# Patient Record
Sex: Female | Born: 1945 | Race: White | Hispanic: No | Marital: Single | Smoking: Never smoker
Health system: Southern US, Community
[De-identification: ages and names within clinical notes are randomized; demographics above are authoritative.]

## PROBLEM LIST (undated history)

## (undated) DIAGNOSIS — IMO0002 Reserved for concepts with insufficient information to code with codable children: Secondary | ICD-10-CM

## (undated) DIAGNOSIS — F418 Other specified anxiety disorders: Secondary | ICD-10-CM

## (undated) DIAGNOSIS — I10 Essential (primary) hypertension: Secondary | ICD-10-CM

## (undated) DIAGNOSIS — R9431 Abnormal electrocardiogram [ECG] [EKG]: Secondary | ICD-10-CM

## (undated) DIAGNOSIS — F32A Depression, unspecified: Secondary | ICD-10-CM

## (undated) DIAGNOSIS — J189 Pneumonia, unspecified organism: Secondary | ICD-10-CM

## (undated) DIAGNOSIS — IMO0001 Reserved for inherently not codable concepts without codable children: Secondary | ICD-10-CM

## (undated) DIAGNOSIS — F329 Major depressive disorder, single episode, unspecified: Secondary | ICD-10-CM

## (undated) DIAGNOSIS — F419 Anxiety disorder, unspecified: Secondary | ICD-10-CM

## (undated) DIAGNOSIS — R748 Abnormal levels of other serum enzymes: Secondary | ICD-10-CM

## (undated) DIAGNOSIS — K635 Polyp of colon: Secondary | ICD-10-CM

## (undated) DIAGNOSIS — J45909 Unspecified asthma, uncomplicated: Secondary | ICD-10-CM

## (undated) DIAGNOSIS — C801 Malignant (primary) neoplasm, unspecified: Secondary | ICD-10-CM

## (undated) DIAGNOSIS — E1165 Type 2 diabetes mellitus with hyperglycemia: Secondary | ICD-10-CM

## (undated) DIAGNOSIS — C50919 Malignant neoplasm of unspecified site of unspecified female breast: Secondary | ICD-10-CM

## (undated) DIAGNOSIS — Z923 Personal history of irradiation: Secondary | ICD-10-CM

## (undated) HISTORY — DX: Abnormal electrocardiogram (ECG) (EKG): R94.31

## (undated) HISTORY — PX: CHOLECYSTECTOMY: SHX55

## (undated) HISTORY — PX: APPENDECTOMY: SHX54

## (undated) HISTORY — DX: Reserved for concepts with insufficient information to code with codable children: IMO0002

## (undated) HISTORY — DX: Abnormal levels of other serum enzymes: R74.8

## (undated) HISTORY — PX: CATARACT EXTRACTION: SUR2

## (undated) HISTORY — DX: Reserved for inherently not codable concepts without codable children: IMO0001

## (undated) HISTORY — PX: BILATERAL OOPHORECTOMY: SHX1221

## (undated) HISTORY — PX: ABDOMINAL HYSTERECTOMY: SHX81

## (undated) HISTORY — DX: Unspecified asthma, uncomplicated: J45.909

---
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
## 1998-08-06 ENCOUNTER — Other Ambulatory Visit: Admission: RE | Admit: 1998-08-06 | Discharge: 1998-08-06 | Payer: Self-pay | Admitting: Obstetrics and Gynecology

## 1999-10-21 ENCOUNTER — Other Ambulatory Visit: Admission: RE | Admit: 1999-10-21 | Discharge: 1999-10-21 | Payer: Self-pay | Admitting: Obstetrics and Gynecology

## 2001-02-14 ENCOUNTER — Other Ambulatory Visit: Admission: RE | Admit: 2001-02-14 | Discharge: 2001-02-14 | Payer: Self-pay | Admitting: Obstetrics and Gynecology

## 2009-01-20 ENCOUNTER — Encounter: Admission: RE | Admit: 2009-01-20 | Discharge: 2009-01-20 | Payer: Self-pay | Admitting: Orthopedic Surgery

## 2009-01-24 ENCOUNTER — Encounter: Admission: RE | Admit: 2009-01-24 | Discharge: 2009-01-24 | Payer: Self-pay | Admitting: Orthopedic Surgery

## 2010-04-12 ENCOUNTER — Encounter: Payer: Self-pay | Admitting: Orthopedic Surgery

## 2012-01-10 ENCOUNTER — Other Ambulatory Visit: Payer: Self-pay | Admitting: Family Medicine

## 2012-01-10 DIAGNOSIS — Z1231 Encounter for screening mammogram for malignant neoplasm of breast: Secondary | ICD-10-CM

## 2012-01-14 ENCOUNTER — Ambulatory Visit
Admission: RE | Admit: 2012-01-14 | Discharge: 2012-01-14 | Disposition: A | Discharge status: Home / Self Care | Payer: Medicare Other | Source: Ambulatory Visit | Attending: Family Medicine | Admitting: Family Medicine

## 2012-01-14 DIAGNOSIS — Z1231 Encounter for screening mammogram for malignant neoplasm of breast: Secondary | ICD-10-CM

## 2012-02-14 ENCOUNTER — Ambulatory Visit: Payer: Self-pay

## 2012-04-03 ENCOUNTER — Other Ambulatory Visit: Payer: Self-pay | Admitting: Family Medicine

## 2012-04-03 DIAGNOSIS — N644 Mastodynia: Secondary | ICD-10-CM

## 2014-04-11 DIAGNOSIS — L821 Other seborrheic keratosis: Secondary | ICD-10-CM | POA: Diagnosis not present

## 2014-04-11 DIAGNOSIS — Z411 Encounter for cosmetic surgery: Secondary | ICD-10-CM | POA: Diagnosis not present

## 2014-04-11 DIAGNOSIS — L280 Lichen simplex chronicus: Secondary | ICD-10-CM | POA: Diagnosis not present

## 2014-04-11 DIAGNOSIS — L820 Inflamed seborrheic keratosis: Secondary | ICD-10-CM | POA: Diagnosis not present

## 2014-05-06 DIAGNOSIS — I10 Essential (primary) hypertension: Secondary | ICD-10-CM | POA: Diagnosis not present

## 2014-05-06 DIAGNOSIS — M8580 Other specified disorders of bone density and structure, unspecified site: Secondary | ICD-10-CM | POA: Diagnosis not present

## 2014-05-06 DIAGNOSIS — Z23 Encounter for immunization: Secondary | ICD-10-CM | POA: Diagnosis not present

## 2014-05-06 DIAGNOSIS — R7309 Other abnormal glucose: Secondary | ICD-10-CM | POA: Diagnosis not present

## 2014-05-06 DIAGNOSIS — Z13820 Encounter for screening for osteoporosis: Secondary | ICD-10-CM | POA: Diagnosis not present

## 2014-05-06 DIAGNOSIS — J45909 Unspecified asthma, uncomplicated: Secondary | ICD-10-CM | POA: Diagnosis not present

## 2014-05-06 DIAGNOSIS — Z0000 Encounter for general adult medical examination without abnormal findings: Secondary | ICD-10-CM | POA: Diagnosis not present

## 2014-05-06 DIAGNOSIS — K219 Gastro-esophageal reflux disease without esophagitis: Secondary | ICD-10-CM | POA: Diagnosis not present

## 2014-05-06 DIAGNOSIS — F329 Major depressive disorder, single episode, unspecified: Secondary | ICD-10-CM | POA: Diagnosis not present

## 2014-05-06 DIAGNOSIS — Z13220 Encounter for screening for lipoid disorders: Secondary | ICD-10-CM | POA: Diagnosis not present

## 2014-06-28 DIAGNOSIS — E1165 Type 2 diabetes mellitus with hyperglycemia: Secondary | ICD-10-CM | POA: Diagnosis not present

## 2014-06-28 DIAGNOSIS — E8881 Metabolic syndrome: Secondary | ICD-10-CM | POA: Diagnosis not present

## 2014-06-28 DIAGNOSIS — I10 Essential (primary) hypertension: Secondary | ICD-10-CM | POA: Diagnosis not present

## 2014-06-28 DIAGNOSIS — Z9181 History of falling: Secondary | ICD-10-CM | POA: Diagnosis not present

## 2014-09-24 DIAGNOSIS — H26492 Other secondary cataract, left eye: Secondary | ICD-10-CM | POA: Diagnosis not present

## 2014-12-10 DIAGNOSIS — E1165 Type 2 diabetes mellitus with hyperglycemia: Secondary | ICD-10-CM | POA: Diagnosis not present

## 2014-12-10 DIAGNOSIS — K219 Gastro-esophageal reflux disease without esophagitis: Secondary | ICD-10-CM | POA: Diagnosis not present

## 2014-12-10 DIAGNOSIS — I10 Essential (primary) hypertension: Secondary | ICD-10-CM | POA: Diagnosis not present

## 2014-12-10 DIAGNOSIS — F419 Anxiety disorder, unspecified: Secondary | ICD-10-CM | POA: Diagnosis not present

## 2014-12-18 ENCOUNTER — Other Ambulatory Visit: Payer: Self-pay

## 2014-12-18 DIAGNOSIS — Z1231 Encounter for screening mammogram for malignant neoplasm of breast: Secondary | ICD-10-CM

## 2014-12-31 ENCOUNTER — Ambulatory Visit
Admission: RE | Admit: 2014-12-31 | Discharge: 2014-12-31 | Disposition: A | Discharge status: Home / Self Care | Payer: Medicare Other | Source: Ambulatory Visit

## 2014-12-31 DIAGNOSIS — Z1231 Encounter for screening mammogram for malignant neoplasm of breast: Secondary | ICD-10-CM

## 2015-01-02 ENCOUNTER — Other Ambulatory Visit: Payer: Self-pay | Admitting: Family Medicine

## 2015-01-02 DIAGNOSIS — R928 Other abnormal and inconclusive findings on diagnostic imaging of breast: Secondary | ICD-10-CM

## 2015-01-06 DIAGNOSIS — K219 Gastro-esophageal reflux disease without esophagitis: Secondary | ICD-10-CM | POA: Diagnosis not present

## 2015-01-09 ENCOUNTER — Ambulatory Visit
Admission: RE | Admit: 2015-01-09 | Discharge: 2015-01-09 | Disposition: A | Discharge status: Home / Self Care | Payer: Medicare Other | Source: Ambulatory Visit | Attending: Family Medicine | Admitting: Family Medicine

## 2015-01-09 ENCOUNTER — Other Ambulatory Visit: Payer: Self-pay | Admitting: Family Medicine

## 2015-01-09 DIAGNOSIS — N63 Unspecified lump in breast: Secondary | ICD-10-CM | POA: Diagnosis not present

## 2015-01-09 DIAGNOSIS — R928 Other abnormal and inconclusive findings on diagnostic imaging of breast: Secondary | ICD-10-CM

## 2015-01-15 ENCOUNTER — Other Ambulatory Visit: Payer: Self-pay | Admitting: Family Medicine

## 2015-01-15 ENCOUNTER — Ambulatory Visit
Admission: RE | Admit: 2015-01-15 | Discharge: 2015-01-15 | Disposition: A | Discharge status: Home / Self Care | Payer: Medicare Other | Source: Ambulatory Visit | Attending: Family Medicine | Admitting: Family Medicine

## 2015-01-15 DIAGNOSIS — R928 Other abnormal and inconclusive findings on diagnostic imaging of breast: Secondary | ICD-10-CM

## 2015-01-15 DIAGNOSIS — N63 Unspecified lump in breast: Secondary | ICD-10-CM | POA: Diagnosis not present

## 2015-01-15 DIAGNOSIS — C50412 Malignant neoplasm of upper-outer quadrant of left female breast: Secondary | ICD-10-CM | POA: Diagnosis not present

## 2015-01-16 ENCOUNTER — Other Ambulatory Visit: Payer: Self-pay

## 2015-01-16 DIAGNOSIS — K2970 Gastritis, unspecified, without bleeding: Secondary | ICD-10-CM | POA: Diagnosis not present

## 2015-01-16 DIAGNOSIS — K219 Gastro-esophageal reflux disease without esophagitis: Secondary | ICD-10-CM | POA: Diagnosis not present

## 2015-01-16 DIAGNOSIS — K449 Diaphragmatic hernia without obstruction or gangrene: Secondary | ICD-10-CM | POA: Diagnosis not present

## 2015-01-16 DIAGNOSIS — D122 Benign neoplasm of ascending colon: Secondary | ICD-10-CM | POA: Diagnosis not present

## 2015-01-16 DIAGNOSIS — K635 Polyp of colon: Secondary | ICD-10-CM | POA: Diagnosis not present

## 2015-01-16 DIAGNOSIS — I10 Essential (primary) hypertension: Secondary | ICD-10-CM | POA: Diagnosis not present

## 2015-01-16 DIAGNOSIS — K5730 Diverticulosis of large intestine without perforation or abscess without bleeding: Secondary | ICD-10-CM | POA: Diagnosis not present

## 2015-01-16 DIAGNOSIS — K621 Rectal polyp: Secondary | ICD-10-CM | POA: Diagnosis not present

## 2015-01-16 DIAGNOSIS — K209 Esophagitis, unspecified: Secondary | ICD-10-CM | POA: Diagnosis not present

## 2015-01-16 DIAGNOSIS — R1310 Dysphagia, unspecified: Secondary | ICD-10-CM | POA: Diagnosis not present

## 2015-01-16 DIAGNOSIS — Z86010 Personal history of colonic polyps: Secondary | ICD-10-CM | POA: Diagnosis not present

## 2015-01-16 DIAGNOSIS — Z1211 Encounter for screening for malignant neoplasm of colon: Secondary | ICD-10-CM | POA: Diagnosis not present

## 2015-01-16 DIAGNOSIS — Z9049 Acquired absence of other specified parts of digestive tract: Secondary | ICD-10-CM | POA: Diagnosis not present

## 2015-01-23 ENCOUNTER — Other Ambulatory Visit: Payer: Self-pay | Admitting: General Surgery

## 2015-01-23 DIAGNOSIS — C50412 Malignant neoplasm of upper-outer quadrant of left female breast: Secondary | ICD-10-CM | POA: Diagnosis not present

## 2015-02-06 ENCOUNTER — Other Ambulatory Visit: Payer: Self-pay | Admitting: General Surgery

## 2015-02-06 ENCOUNTER — Ambulatory Visit
Admission: RE | Admit: 2015-02-06 | Discharge: 2015-02-06 | Disposition: A | Discharge status: Home / Self Care | Payer: Medicare Other | Source: Ambulatory Visit | Attending: General Surgery | Admitting: General Surgery

## 2015-02-06 DIAGNOSIS — C50412 Malignant neoplasm of upper-outer quadrant of left female breast: Secondary | ICD-10-CM

## 2015-02-18 DIAGNOSIS — K589 Irritable bowel syndrome without diarrhea: Secondary | ICD-10-CM | POA: Diagnosis not present

## 2015-02-18 DIAGNOSIS — C50911 Malignant neoplasm of unspecified site of right female breast: Secondary | ICD-10-CM | POA: Diagnosis not present

## 2015-02-20 ENCOUNTER — Encounter (HOSPITAL_BASED_OUTPATIENT_CLINIC_OR_DEPARTMENT_OTHER): Payer: Self-pay | Admitting: *Deleted

## 2015-02-20 HISTORY — PX: BREAST LUMPECTOMY: SHX2

## 2015-02-21 ENCOUNTER — Other Ambulatory Visit: Payer: Self-pay

## 2015-02-21 ENCOUNTER — Encounter (HOSPITAL_BASED_OUTPATIENT_CLINIC_OR_DEPARTMENT_OTHER)
Admission: RE | Admit: 2015-02-21 | Discharge: 2015-02-21 | Disposition: A | Discharge status: Home / Self Care | Payer: Medicare Other | Source: Ambulatory Visit | Attending: General Surgery | Admitting: General Surgery

## 2015-02-21 DIAGNOSIS — Z9049 Acquired absence of other specified parts of digestive tract: Secondary | ICD-10-CM | POA: Diagnosis not present

## 2015-02-21 DIAGNOSIS — C50912 Malignant neoplasm of unspecified site of left female breast: Secondary | ICD-10-CM | POA: Insufficient documentation

## 2015-02-21 DIAGNOSIS — Z01818 Encounter for other preprocedural examination: Secondary | ICD-10-CM | POA: Insufficient documentation

## 2015-02-21 DIAGNOSIS — C50412 Malignant neoplasm of upper-outer quadrant of left female breast: Secondary | ICD-10-CM | POA: Diagnosis not present

## 2015-02-21 DIAGNOSIS — Z90710 Acquired absence of both cervix and uterus: Secondary | ICD-10-CM | POA: Diagnosis not present

## 2015-02-21 DIAGNOSIS — Z79899 Other long term (current) drug therapy: Secondary | ICD-10-CM | POA: Diagnosis not present

## 2015-02-21 DIAGNOSIS — Z170 Estrogen receptor positive status [ER+]: Secondary | ICD-10-CM | POA: Diagnosis not present

## 2015-02-21 DIAGNOSIS — I10 Essential (primary) hypertension: Secondary | ICD-10-CM | POA: Diagnosis not present

## 2015-02-21 DIAGNOSIS — F419 Anxiety disorder, unspecified: Secondary | ICD-10-CM | POA: Diagnosis not present

## 2015-02-21 DIAGNOSIS — Z6841 Body Mass Index (BMI) 40.0 and over, adult: Secondary | ICD-10-CM | POA: Diagnosis not present

## 2015-02-21 LAB — BASIC METABOLIC PANEL
Anion gap: 8 (ref 5–15)
BUN: 15 mg/dL (ref 6–20)
CO2: 32 mmol/L (ref 22–32)
Calcium: 9.4 mg/dL (ref 8.9–10.3)
Chloride: 100 mmol/L — ABNORMAL LOW (ref 101–111)
Creatinine, Ser: 0.92 mg/dL (ref 0.44–1.00)
GFR calc Af Amer: >60 mL/min (ref >60)
GFR calc non Af Amer: >60 mL/min (ref >60)
Glucose, Bld: 119 mg/dL — ABNORMAL HIGH (ref 65–99)
Potassium: 4.9 mmol/L (ref 3.5–5.1)
Sodium: 140 mmol/L (ref 135–145)

## 2015-02-21 NOTE — Progress Notes (Signed)
Anesthesia consult by Dr Lauretta Grill ok for surgery at [facility]

## 2015-02-24 ENCOUNTER — Ambulatory Visit
Admission: RE | Admit: 2015-02-24 | Discharge: 2015-02-24 | Disposition: A | Discharge status: Home / Self Care | Payer: Medicare Other | Source: Ambulatory Visit | Attending: General Surgery | Admitting: General Surgery

## 2015-02-24 DIAGNOSIS — Z01818 Encounter for other preprocedural examination: Secondary | ICD-10-CM | POA: Diagnosis not present

## 2015-02-24 DIAGNOSIS — C50412 Malignant neoplasm of upper-outer quadrant of left female breast: Secondary | ICD-10-CM

## 2015-02-24 DIAGNOSIS — R928 Other abnormal and inconclusive findings on diagnostic imaging of breast: Secondary | ICD-10-CM | POA: Diagnosis not present

## 2015-02-25 ENCOUNTER — Encounter (HOSPITAL_BASED_OUTPATIENT_CLINIC_OR_DEPARTMENT_OTHER)
Admission: RE | Admit: 2015-02-25 | Discharge: 2015-02-25 | Disposition: A | Discharge status: Home / Self Care | Payer: Medicare Other | Source: Ambulatory Visit | Attending: General Surgery | Admitting: General Surgery

## 2015-02-28 ENCOUNTER — Ambulatory Visit
Admission: RE | Admit: 2015-02-28 | Discharge: 2015-02-28 | Disposition: A | Discharge status: Home / Self Care | Payer: Medicare Other | Source: Ambulatory Visit | Attending: General Surgery | Admitting: General Surgery

## 2015-02-28 ENCOUNTER — Ambulatory Visit (HOSPITAL_BASED_OUTPATIENT_CLINIC_OR_DEPARTMENT_OTHER): Payer: Medicare Other | Admitting: Certified Registered"

## 2015-02-28 ENCOUNTER — Encounter (HOSPITAL_COMMUNITY)
Admission: RE | Admit: 2015-02-28 | Discharge: 2015-02-28 | Disposition: A | Discharge status: Home / Self Care | Payer: Medicare Other | Source: Ambulatory Visit | Attending: General Surgery | Admitting: General Surgery

## 2015-02-28 ENCOUNTER — Encounter (HOSPITAL_BASED_OUTPATIENT_CLINIC_OR_DEPARTMENT_OTHER): Payer: Self-pay | Admitting: *Deleted

## 2015-02-28 ENCOUNTER — Ambulatory Visit (HOSPITAL_BASED_OUTPATIENT_CLINIC_OR_DEPARTMENT_OTHER)
Admission: RE | Admit: 2015-02-28 | Discharge: 2015-02-28 | Disposition: A | Discharge status: Home / Self Care | Payer: Medicare Other | Source: Ambulatory Visit | Attending: General Surgery | Admitting: General Surgery

## 2015-02-28 ENCOUNTER — Encounter (HOSPITAL_BASED_OUTPATIENT_CLINIC_OR_DEPARTMENT_OTHER)
Admission: RE | Disposition: A | Discharge status: Home / Self Care | Payer: Self-pay | Source: Ambulatory Visit | Attending: General Surgery

## 2015-02-28 DIAGNOSIS — Z79899 Other long term (current) drug therapy: Secondary | ICD-10-CM | POA: Insufficient documentation

## 2015-02-28 DIAGNOSIS — Z90710 Acquired absence of both cervix and uterus: Secondary | ICD-10-CM | POA: Diagnosis not present

## 2015-02-28 DIAGNOSIS — R928 Other abnormal and inconclusive findings on diagnostic imaging of breast: Secondary | ICD-10-CM | POA: Diagnosis not present

## 2015-02-28 DIAGNOSIS — Z9049 Acquired absence of other specified parts of digestive tract: Secondary | ICD-10-CM | POA: Insufficient documentation

## 2015-02-28 DIAGNOSIS — Z6841 Body Mass Index (BMI) 40.0 and over, adult: Secondary | ICD-10-CM | POA: Diagnosis not present

## 2015-02-28 DIAGNOSIS — Z170 Estrogen receptor positive status [ER+]: Secondary | ICD-10-CM | POA: Insufficient documentation

## 2015-02-28 DIAGNOSIS — C50412 Malignant neoplasm of upper-outer quadrant of left female breast: Secondary | ICD-10-CM | POA: Diagnosis not present

## 2015-02-28 DIAGNOSIS — G8918 Other acute postprocedural pain: Secondary | ICD-10-CM | POA: Diagnosis not present

## 2015-02-28 DIAGNOSIS — F419 Anxiety disorder, unspecified: Secondary | ICD-10-CM | POA: Insufficient documentation

## 2015-02-28 DIAGNOSIS — Z9012 Acquired absence of left breast and nipple: Secondary | ICD-10-CM | POA: Diagnosis not present

## 2015-02-28 DIAGNOSIS — I10 Essential (primary) hypertension: Secondary | ICD-10-CM | POA: Diagnosis not present

## 2015-02-28 DIAGNOSIS — C50912 Malignant neoplasm of unspecified site of left female breast: Secondary | ICD-10-CM | POA: Diagnosis not present

## 2015-02-28 DIAGNOSIS — R079 Chest pain, unspecified: Secondary | ICD-10-CM | POA: Diagnosis not present

## 2015-02-28 HISTORY — DX: Essential (primary) hypertension: I10

## 2015-02-28 HISTORY — DX: Depression, unspecified: F32.A

## 2015-02-28 HISTORY — PX: RADIOACTIVE SEED GUIDED PARTIAL MASTECTOMY WITH AXILLARY SENTINEL LYMPH NODE BIOPSY: SHX6520

## 2015-02-28 HISTORY — DX: Major depressive disorder, single episode, unspecified: F32.9

## 2015-02-28 HISTORY — DX: Anxiety disorder, unspecified: F41.9

## 2015-02-28 HISTORY — DX: Polyp of colon: K63.5

## 2015-02-28 HISTORY — DX: Malignant (primary) neoplasm, unspecified: C80.1

## 2015-02-28 SURGERY — RADIOACTIVE SEED GUIDED PARTIAL MASTECTOMY WITH AXILLARY SENTINEL LYMPH NODE BIOPSY
Anesthesia: General | Site: Breast | Laterality: Left

## 2015-02-28 MED ORDER — FENTANYL CITRATE (PF) 100 MCG/2ML IJ SOLN
INTRAMUSCULAR | Status: AC
  Filled 2015-02-28: qty 2

## 2015-02-28 MED ORDER — PHENYLEPHRINE HCL 10 MG/ML IJ SOLN
INTRAMUSCULAR | Status: DC | PRN
  Administered 2015-02-28: 40 ug via INTRAVENOUS

## 2015-02-28 MED ORDER — ONDANSETRON HCL 4 MG/2ML IJ SOLN
INTRAMUSCULAR | Status: AC
  Filled 2015-02-28: qty 2

## 2015-02-28 MED ORDER — SODIUM CHLORIDE 0.9 % IJ SOLN
INTRAMUSCULAR | Status: AC
  Filled 2015-02-28: qty 10

## 2015-02-28 MED ORDER — OXYCODONE-ACETAMINOPHEN 5-325 MG PO TABS: 1.0000 | ORAL_TABLET | ORAL | Status: DC | PRN

## 2015-02-28 MED ORDER — LIDOCAINE HCL (CARDIAC) 20 MG/ML IV SOLN
INTRAVENOUS | Status: AC
  Filled 2015-02-28: qty 5

## 2015-02-28 MED ORDER — EPHEDRINE SULFATE 50 MG/ML IJ SOLN
INTRAMUSCULAR | Status: DC | PRN
  Administered 2015-02-28 (×2): 10 mg via INTRAVENOUS

## 2015-02-28 MED ORDER — PROPOFOL 10 MG/ML IV BOLUS
INTRAVENOUS | Status: AC
  Filled 2015-02-28: qty 20

## 2015-02-28 MED ORDER — LIDOCAINE HCL (CARDIAC) 20 MG/ML IV SOLN
INTRAVENOUS | Status: DC | PRN
  Administered 2015-02-28: 50 mg via INTRAVENOUS

## 2015-02-28 MED ORDER — PROMETHAZINE HCL 25 MG/ML IJ SOLN: 6.2500 mg | INTRAMUSCULAR | Status: DC | PRN

## 2015-02-28 MED ORDER — PHENYLEPHRINE HCL 10 MG/ML IJ SOLN
10.0000 mg | INTRAVENOUS | Status: DC | PRN
  Administered 2015-02-28: 40 ug/min via INTRAVENOUS

## 2015-02-28 MED ORDER — BUPIVACAINE HCL (PF) 0.5 % IJ SOLN
INTRAMUSCULAR | Status: DC | PRN
  Administered 2015-02-28: 30 mL via PERINEURAL

## 2015-02-28 MED ORDER — PHENYLEPHRINE 40 MCG/ML (10ML) SYRINGE FOR IV PUSH (FOR BLOOD PRESSURE SUPPORT)
PREFILLED_SYRINGE | INTRAVENOUS | Status: AC
  Filled 2015-02-28: qty 10

## 2015-02-28 MED ORDER — SUCCINYLCHOLINE CHLORIDE 20 MG/ML IJ SOLN
INTRAMUSCULAR | Status: DC | PRN
  Administered 2015-02-28: 100 mg via INTRAVENOUS

## 2015-02-28 MED ORDER — METHYLENE BLUE 1 % INJ SOLN
INTRAMUSCULAR | Status: AC
  Filled 2015-02-28: qty 10

## 2015-02-28 MED ORDER — CHLORHEXIDINE GLUCONATE 4 % EX LIQD: 1.0000 "application " | Freq: Once | CUTANEOUS | Status: DC

## 2015-02-28 MED ORDER — MIDAZOLAM HCL 2 MG/2ML IJ SOLN
INTRAMUSCULAR | Status: AC
  Filled 2015-02-28: qty 2

## 2015-02-28 MED ORDER — DEXAMETHASONE SODIUM PHOSPHATE 10 MG/ML IJ SOLN
INTRAMUSCULAR | Status: AC
  Filled 2015-02-28: qty 1

## 2015-02-28 MED ORDER — GLYCOPYRROLATE 0.2 MG/ML IJ SOLN: 0.2000 mg | Freq: Once | INTRAMUSCULAR | Status: DC | PRN

## 2015-02-28 MED ORDER — MIDAZOLAM HCL 2 MG/2ML IJ SOLN
1.0000 mg | INTRAMUSCULAR | Status: DC | PRN
  Administered 2015-02-28 (×2): 2 mg via INTRAVENOUS

## 2015-02-28 MED ORDER — BUPIVACAINE HCL (PF) 0.25 % IJ SOLN
INTRAMUSCULAR | Status: DC | PRN
  Administered 2015-02-28: 26 mL

## 2015-02-28 MED ORDER — CEFAZOLIN SODIUM-DEXTROSE 2-3 GM-% IV SOLR
INTRAVENOUS | Status: AC
  Filled 2015-02-28: qty 50

## 2015-02-28 MED ORDER — SCOPOLAMINE 1 MG/3DAYS TD PT72: 1.0000 | MEDICATED_PATCH | Freq: Once | TRANSDERMAL | Status: DC | PRN

## 2015-02-28 MED ORDER — FENTANYL CITRATE (PF) 100 MCG/2ML IJ SOLN
50.0000 ug | INTRAMUSCULAR | Status: DC | PRN
  Administered 2015-02-28 (×2): 100 ug via INTRAVENOUS

## 2015-02-28 MED ORDER — ONDANSETRON HCL 4 MG/2ML IJ SOLN
INTRAMUSCULAR | Status: DC | PRN
  Administered 2015-02-28: 4 mg via INTRAVENOUS

## 2015-02-28 MED ORDER — LACTATED RINGERS IV SOLN
INTRAVENOUS | Status: DC
  Administered 2015-02-28: 08:00:00 via INTRAVENOUS

## 2015-02-28 MED ORDER — CEFAZOLIN SODIUM 1-5 GM-% IV SOLN
INTRAVENOUS | Status: AC
  Filled 2015-02-28: qty 50

## 2015-02-28 MED ORDER — HYDROMORPHONE HCL 1 MG/ML IJ SOLN: 0.2500 mg | INTRAMUSCULAR | Status: DC | PRN

## 2015-02-28 MED ORDER — CEFAZOLIN SODIUM-DEXTROSE 2-3 GM-% IV SOLR
2.0000 g | INTRAVENOUS | Status: AC
  Administered 2015-02-28: 3 g via INTRAVENOUS

## 2015-02-28 MED ORDER — DEXAMETHASONE SODIUM PHOSPHATE 4 MG/ML IJ SOLN
INTRAMUSCULAR | Status: DC | PRN
  Administered 2015-02-28: 10 mg via INTRAVENOUS

## 2015-02-28 MED ORDER — BUPIVACAINE HCL (PF) 0.25 % IJ SOLN
INTRAMUSCULAR | Status: AC
  Filled 2015-02-28: qty 30

## 2015-02-28 MED ORDER — BUPIVACAINE-EPINEPHRINE (PF) 0.25% -1:200000 IJ SOLN
INTRAMUSCULAR | Status: AC
  Filled 2015-02-28: qty 30

## 2015-02-28 MED ORDER — PROPOFOL 10 MG/ML IV BOLUS
INTRAVENOUS | Status: DC | PRN
  Administered 2015-02-28: 200 mg via INTRAVENOUS

## 2015-02-28 MED ORDER — TECHNETIUM TC 99M SULFUR COLLOID FILTERED
1.0000 | Freq: Once | INTRAVENOUS | Status: AC | PRN
  Administered 2015-02-28: 1 via INTRADERMAL

## 2015-02-28 SURGICAL SUPPLY — 45 items
APPLIER CLIP 9.375 MED OPEN (MISCELLANEOUS) ×3
APR CLP MED 9.3 20 MLT OPN (MISCELLANEOUS) ×1
BLADE SURG 15 STRL LF DISP TIS (BLADE) ×1 IMPLANT
BLADE SURG 15 STRL SS (BLADE) ×3
CANISTER SUC SOCK COL 7IN (MISCELLANEOUS) IMPLANT
CANISTER SUCT 1200ML W/VALVE (MISCELLANEOUS) ×2 IMPLANT
CHLORAPREP W/TINT 26ML (MISCELLANEOUS) ×3 IMPLANT
CLIP APPLIE 9.375 MED OPEN (MISCELLANEOUS) ×1 IMPLANT
COVER BACK TABLE 60X90IN (DRAPES) ×3 IMPLANT
COVER MAYO STAND STRL (DRAPES) ×3 IMPLANT
COVER PROBE W GEL 5X96 (DRAPES) ×3 IMPLANT
DECANTER SPIKE VIAL GLASS SM (MISCELLANEOUS) IMPLANT
DEVICE DUBIN W/COMP PLATE 8390 (MISCELLANEOUS) ×3 IMPLANT
DRAPE LAPAROSCOPIC ABDOMINAL (DRAPES) ×3 IMPLANT
DRAPE UTILITY XL STRL (DRAPES) ×3 IMPLANT
ELECT COATED BLADE 2.86 ST (ELECTRODE) ×3 IMPLANT
ELECT REM PT RETURN 9FT ADLT (ELECTROSURGICAL) ×3
ELECTRODE REM PT RTRN 9FT ADLT (ELECTROSURGICAL) ×1 IMPLANT
GLOVE BIO SURGEON STRL SZ 6.5 (GLOVE) ×1 IMPLANT
GLOVE BIO SURGEON STRL SZ7.5 (GLOVE) ×5 IMPLANT
GLOVE BIO SURGEONS STRL SZ 6.5 (GLOVE) ×1
GLOVE BIOGEL PI IND STRL 6.5 (GLOVE) IMPLANT
GLOVE BIOGEL PI INDICATOR 6.5 (GLOVE) ×4
GOWN STRL REUS W/ TWL LRG LVL3 (GOWN DISPOSABLE) ×2 IMPLANT
GOWN STRL REUS W/TWL LRG LVL3 (GOWN DISPOSABLE) ×6
KIT MARKER MARGIN INK (KITS) ×3 IMPLANT
LIQUID BAND (GAUZE/BANDAGES/DRESSINGS) ×3 IMPLANT
NDL HYPO 25X1 1.5 SAFETY (NEEDLE) ×1 IMPLANT
NDL SAFETY ECLIPSE 18X1.5 (NEEDLE) IMPLANT
NEEDLE HYPO 18GX1.5 SHARP (NEEDLE)
NEEDLE HYPO 25X1 1.5 SAFETY (NEEDLE) ×3 IMPLANT
NS IRRIG 1000ML POUR BTL (IV SOLUTION) ×2 IMPLANT
PACK BASIN DAY SURGERY FS (CUSTOM PROCEDURE TRAY) ×3 IMPLANT
PENCIL BUTTON HOLSTER BLD 10FT (ELECTRODE) ×3 IMPLANT
SLEEVE SCD COMPRESS KNEE MED (MISCELLANEOUS) ×3 IMPLANT
SPONGE LAP 18X18 X RAY DECT (DISPOSABLE) ×3 IMPLANT
SUT MON AB 4-0 PC3 18 (SUTURE) ×6 IMPLANT
SUT SILK 2 0 SH (SUTURE) IMPLANT
SUT VICRYL 3-0 CR8 SH (SUTURE) ×5 IMPLANT
SYR CONTROL 10ML LL (SYRINGE) ×3 IMPLANT
TOWEL OR 17X24 6PK STRL BLUE (TOWEL DISPOSABLE) ×3 IMPLANT
TOWEL OR NON WOVEN STRL DISP B (DISPOSABLE) ×3 IMPLANT
TUBE CONNECTING 20'X1/4 (TUBING) ×1
TUBE CONNECTING 20X1/4 (TUBING) ×1 IMPLANT
YANKAUER SUCT BULB TIP NO VENT (SUCTIONS) ×2 IMPLANT

## 2015-02-28 NOTE — Anesthesia Postprocedure Evaluation (Signed)
Anesthesia Post Note  Patient: Donna Shaffer  Procedure(s) Performed: Procedure(s) (LRB): RADIOACTIVE SEED GUIDED PARTIAL MASTECTOMY WITH AXILLARY SENTINEL LYMPH NODE BIOPSY (Left)  Patient location during evaluation: PACU Anesthesia Type: General Level of consciousness: awake and alert Pain management: pain level controlled Vital Signs Assessment: post-procedure vital signs reviewed and stable Respiratory status: spontaneous breathing and respiratory function stable Cardiovascular status: blood pressure returned to baseline and stable Postop Assessment: no signs of nausea or vomiting Anesthetic complications: no    Last Vitals:  Filed Vitals:   02/28/15 1130 02/28/15 1145  BP: 132/53 124/62  Pulse: 105 107  Temp:    Resp: 18 20    Last Pain:  Filed Vitals:   02/28/15 1152  PainSc: 0-No pain                 Cheryl Chay S

## 2015-02-28 NOTE — Op Note (Signed)
02/28/2015  10:41 AM  PATIENT:  Donna Shaffer  69 y.o. female  PRE-OPERATIVE DIAGNOSIS:  LEFT BREAST CANCER  POST-OPERATIVE DIAGNOSIS:  LEFT BREAST CANCER  PROCEDURE:  Procedure(s): RADIOACTIVE SEED GUIDED PARTIAL MASTECTOMY WITH AXILLARY SENTINEL LYMPH NODE BIOPSY (Left)  SURGEON:  Surgeon(s) and Role:    * Jovita Kussmaul, MD - Primary  PHYSICIAN ASSISTANT:   ASSISTANTS: none   ANESTHESIA:   general  EBL:  Total I/O In: 1000 [I.V.:1000] Out: -   BLOOD ADMINISTERED:none  DRAINS: none   LOCAL MEDICATIONS USED:  MARCAINE     SPECIMEN:  Source of Specimen:  left breast tissue and sentinel nodes X 2  DISPOSITION OF SPECIMEN:  PATHOLOGY  COUNTS:  YES  TOURNIQUET:  * No tourniquets in log *  DICTATION: .Dragon Dictation   After informed consent was obtained the patient was brought to the operating room and placed in the supine position on the operating room table. After adequate induction of general anesthesia the patient's left chest, breast, and axillary area were prepped with ChloraPrep, allowed to dry, and draped in usual sterile manner. Earlier in the day the patient underwent injection of 1 mCi of technetium sulfur colloid in the subareolar position on the left. Previously an I-125 seed was placed in the upper outer quadrant of the left breast to mark an area of invasive breast cancer. The neoprobe was initially set technetium. An area of increased radioactivity was identified in the left axilla. A transversely oriented incision was made with a 15 blade knife overlying this area. The incision was carried through the skin and subcutaneous tissue sharply with electrocautery until the axilla was entered. Using the neoprobe to direct blunt hemostat dissection I was able to identify 2 hot lymph nodes. These were excised sharply with the electrocautery and the lymphatics were controlled with clips. Ex vivo counts on these nodes were approximately 500. These were sent as  sentinel nodes #1 and 2 to pathology. No other hot or palpable lymph nodes were identified in the left axilla. The deep layer of the wound was then closed with interrupted 3-0 Vicryl stitches. The skin was then closed with a running 4-0 Monocryl subcuticular stitch. Attention was then turned to the left breast. The neoprobe was set to I-125. The area of radioactivity was readily identified in the upper outer left breast. An elliptical incision was made in the skin overlying the area of radioactivity with a 15 blade knife. The incision was carried through the skin and subcutaneous tissue sharply with electrocautery. While checking the area of radioactivity frequently a circular portion of breast tissue was excised sharply with the electrocautery. Once the specimen was removed it was oriented with the appropriate paint colors. A specimen radiograph was obtained that showed the clip in seed to be near the center of the specimen. The specimen was then sent to pathology for further evaluation. There was no residual I-125 radioactivity in the left breast. The wound was then irrigated with saline and infiltrated with quarter percent Marcaine. The deep layer of the wound was closed with layers of interrupted 3-0 Vicryl stitches. The skin was then closed with interrupted 4-0 Monocryl subcuticular stitches. Dermabond dressings were applied. The patient tolerated the procedure well. At the end of the case all needle sponge and instrument counts were correct. The patient was then awakened and taken recovery in stable condition.  PLAN OF CARE: Discharge to home after PACU  PATIENT DISPOSITION:  PACU - hemodynamically stable.   Delay start  of Pharmacological VTE agent (>24hrs) due to surgical blood loss or risk of bleeding: not applicable

## 2015-02-28 NOTE — Anesthesia Preprocedure Evaluation (Signed)
Anesthesia Evaluation  Patient identified by MRN, date of birth, ID band Patient awake    Reviewed: Allergy & Precautions, NPO status , Patient's Chart, lab work & pertinent test results  Airway Mallampati: II  TM Distance: >3 FB Neck ROM: Full    Dental no notable dental hx.    Pulmonary neg pulmonary ROS,    Pulmonary exam normal breath sounds clear to auscultation       Cardiovascular hypertension, Pt. on medications Normal cardiovascular exam Rhythm:Regular Rate:Normal     Neuro/Psych Anxiety negative neurological ROS     GI/Hepatic negative GI ROS, Neg liver ROS,   Endo/Other  Morbid obesity  Renal/GU negative Renal ROS  negative genitourinary   Musculoskeletal negative musculoskeletal ROS (+)   Abdominal   Peds negative pediatric ROS (+)  Hematology negative hematology ROS (+)   Anesthesia Other Findings   Reproductive/Obstetrics negative OB ROS                             Anesthesia Physical Anesthesia Plan  ASA: III  Anesthesia Plan: General   Post-op Pain Management: GA combined w/ Regional for post-op pain   Induction: Intravenous  Airway Management Planned: Oral ETT and LMA  Additional Equipment:   Intra-op Plan:   Post-operative Plan: Extubation in OR  Informed Consent: I have reviewed the patients History and Physical, chart, labs and discussed the procedure including the risks, benefits and alternatives for the proposed anesthesia with the patient or authorized representative who has indicated his/her understanding and acceptance.   Dental advisory given  Plan Discussed with: CRNA and Surgeon  Anesthesia Plan Comments:         Anesthesia Quick Evaluation

## 2015-02-28 NOTE — Progress Notes (Signed)
Assisted Dr. Rose with left, ultrasound guided, pectoralis block. Side rails up, monitors on throughout procedure. See vital signs in flow sheet. Tolerated Procedure well. 

## 2015-02-28 NOTE — Anesthesia Procedure Notes (Addendum)
Anesthesia Regional Block:  Pectoralis block  Pre-Anesthetic Checklist: ,, timeout performed, Correct Patient, Correct Site, Correct Laterality, Correct Procedure, Correct Position, site marked, Risks and benefits discussed,  Surgical consent,  Pre-op evaluation,  At surgeon's request and post-op pain management  Laterality: Left  Prep: chloraprep       Needles:  Injection technique: Single-shot  Needle Type: Echogenic Needle     Needle Length: 9cm 9 cm Needle Gauge: 21 and 21 G    Additional Needles:  Procedures: ultrasound guided (picture in chart) Pectoralis block Narrative:  Injection made incrementally with aspirations every 5 mL.  Performed by: Personally   Additional Notes: Patient tolerated the procedure well without complications   Procedure Name: Intubation Performed by: Tawni Millers Pre-anesthesia Checklist: Patient identified, Emergency Drugs available, Suction available and Patient being monitored Patient Re-evaluated:Patient Re-evaluated prior to inductionOxygen Delivery Method: Circle System Utilized Preoxygenation: Pre-oxygenation with 100% oxygen Intubation Type: IV induction Ventilation: Mask ventilation without difficulty Laryngoscope Size: Mac and 3 Grade View: Grade II Tube type: Oral Tube size: 7.0 mm Number of attempts: 1 Airway Equipment and Method: Stylet and Oral airway Placement Confirmation: ETT inserted through vocal cords under direct vision,  positive ETCO2 and breath sounds checked- equal and bilateral Tube secured with: Tape Dental Injury: Teeth and Oropharynx as per pre-operative assessment

## 2015-02-28 NOTE — H&P (Signed)
Donna Shaffer. Dykes Location: [facility] Patient #: [id number] DOB: 1946/01/22 Single / Language: Cleophus Molt / Race: White Female   History of Present Illness  The patient is a 69 year old female who presents with breast cancer. We are asked to see the patient in consultation by Dr. Lovey Newcomer to evaluate her for a left breast cancer. The patient is a 69 year old white female who recently went for a routine screening mammogram. At that time she was found to have a 6 mm abnormality in the upper outer quadrant of the left breast. 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 She was ER and PR positive and HER-2 negative with a Ki-67 of 5%. She denies any breast pain or discharge from the nipple. She has no personal or family history of breast cancer.   Other Problems Back Pain Cholelithiasis Diverticulosis High blood pressure Migraine Headache Oophorectomy  Past Surgical History Appendectomy Breast Biopsy Left. Cataract Surgery Bilateral. Colon Polyp Removal - Colonoscopy Gallbladder Surgery - Open Hysterectomy (not due to cancer) - Complete Tonsillectomy  Diagnostic Studies History Colonoscopy within last year Mammogram within last year Pap Smear 1-5 years ago  Allergies  Sulfa 10 *OPHTHALMIC AGENTS*  Medication History  Valsartan-Hydrochlorothiazide (320-12.5MG Tablet, Oral) Active. Citalopram Hydrobromide (40MG Tablet, Oral) Active. AmLODIPine Besylate (5MG Tablet, Oral) Active. Pantoprazole Sodium (40MG Tablet DR, Oral) Active. ProAir HFA (108 (90 Base)MCG/ACT Aerosol Soln, Inhalation) Active. Excedrin PM (Oral) Active. Medications Reconciled  Social History  Caffeine use Tea. No alcohol use No drug use Tobacco use Never smoker.  Family History  Anesthetic complications Son. Arthritis Father. Cerebrovascular Accident Father. Colon Polyps Mother. Diabetes Mellitus Father. Heart Disease  Father, Mother. Hypertension Father, Mother. Ischemic Bowel Disease Daughter. Migraine Headache Mother. Respiratory Condition Father.  Pregnancy / Birth History Age at menarche 64 years. Age of menopause 45-50 Gravida 2 Irregular periods Maternal age 50-25 Para 2    Review of Systems  General Present- Fatigue and Weight Gain. Not Present- Appetite Loss, Chills, Fever, Night Sweats and Weight Loss. Skin Present- Dryness. Not Present- Change in Wart/Mole, Hives, Jaundice, New Lesions, Non-Healing Wounds, Rash and Ulcer. HEENT Present- Sore Throat. Not Present- Earache, Hearing Loss, Hoarseness, Nose Bleed, Oral Ulcers, Ringing in the Ears, Seasonal Allergies, Sinus Pain, Visual Disturbances, Wears glasses/contact lenses and Yellow Eyes. Breast Present- Breast Mass. Not Present- Breast Pain, Nipple Discharge and Skin Changes. Cardiovascular Not Present- Chest Pain, Difficulty Breathing Lying Down, Leg Cramps, Palpitations, Rapid Heart Rate, Shortness of Breath and Swelling of Extremities. Gastrointestinal Present- Chronic diarrhea. Not Present- Abdominal Pain, Bloating, Bloody Stool, Change in Bowel Habits, Constipation, Difficulty Swallowing, Excessive gas, Gets full quickly at meals, Hemorrhoids, Indigestion, Nausea, Rectal Pain and Vomiting. Female Genitourinary Not Present- Frequency, Nocturia, Painful Urination, Pelvic Pain and Urgency. Musculoskeletal Present- Back Pain. Not Present- Joint Pain, Joint Stiffness, Muscle Pain, Muscle Weakness and Swelling of Extremities. Neurological Present- Headaches. Not Present- Decreased Memory, Fainting, Numbness, Seizures, Tingling, Tremor, Trouble walking and Weakness. Psychiatric Not Present- Anxiety, Bipolar, Change in Sleep Pattern, Depression, Fearful and Frequent crying. Endocrine Not Present- Cold Intolerance, Excessive Hunger, Hair Changes, Heat Intolerance, Hot flashes and New Diabetes. Hematology Not Present- Easy Bruising,  Excessive bleeding, Gland problems, HIV and Persistent Infections.  Vitals  Weight: 265 lb Height: 62in Body Surface Area: 2.15 m Body Mass Index: 48.47 kg/m  Temp.: 97.42F(Temporal)  Pulse: 83 (Regular)  BP: 134/68 (Sitting, Left Arm, Standard)       Physical Exam  General Mental Status-Alert.  General Appearance-Consistent with stated age. Hydration-Well hydrated. Voice-Normal.  Head and Neck Head-normocephalic, atraumatic with no lesions or palpable masses. Trachea-midline. Thyroid Gland Characteristics - normal size and consistency.  Eye Eyeball - Bilateral-Extraocular movements intact. Sclera/Conjunctiva - Bilateral-No scleral icterus.  Chest and Lung Exam Chest and lung exam reveals -quiet, even and easy respiratory effort with no use of accessory muscles and on auscultation, normal breath sounds, no adventitious sounds and normal vocal resonance. Inspection Chest Wall - Normal. Back - normal.  Breast Note: There is no palpable mass in either breast. There is no palpable axillary, supraclavicular, or cervical lymphadenopathy.   Cardiovascular Cardiovascular examination reveals -normal heart sounds, regular rate and rhythm with no murmurs and normal pedal pulses bilaterally.  Abdomen Inspection Inspection of the abdomen reveals - No Hernias. Skin - Scar - no surgical scars. Palpation/Percussion Palpation and Percussion of the abdomen reveal - Soft, Non Tender, No Rebound tenderness, No Rigidity (guarding) and No hepatosplenomegaly. Auscultation Auscultation of the abdomen reveals - Bowel sounds normal.  Neurologic Neurologic evaluation reveals -alert and oriented x 3 with no impairment of recent or remote memory. Mental Status-Normal.  Musculoskeletal Normal Exam - Left-Upper Extremity Strength Normal and Lower Extremity Strength Normal. Normal Exam - Right-Upper Extremity Strength Normal and Lower Extremity  Strength Normal.  Lymphatic Head & Neck  General Head & Neck Lymphatics: Bilateral - Description - Normal. Axillary  General Axillary Region: Bilateral - Description - Normal. Tenderness - Non Tender. Femoral & Inguinal  Generalized Femoral & Inguinal Lymphatics: Bilateral - Description - Normal. Tenderness - Non Tender.    Assessment & Plan  BREAST CANCER OF UPPER-OUTER QUADRANT OF LEFT FEMALE BREAST (C50.412) Impression: The patient appears to have a small early stage I cancer in the upper outer quadrant of the left breast. I have talked her in detail about the different options for treatment and at this point she favors breast conservation. I think this is a very reasonable way of getting rid of her cancer. She is also a good candidate for sentinel node mapping. I have discussed with her in detail the risks and benefits of the operation to do this as well as some of the technical aspects and she understands and wishes to proceed area I will plan for a left breast radioactive seed localized lumpectomy and sentinel node mapping    Signed by Luella Cook, MD

## 2015-02-28 NOTE — Interval H&P Note (Signed)
History and Physical Interval Note:  02/28/2015 8:51 AM  Donna Shaffer  has presented today for surgery, with the diagnosis of LEFT BREAST CANCER  The various methods of treatment have been discussed with the patient and family. After consideration of risks, benefits and other options for treatment, the patient has consented to  Procedure(s): RADIOACTIVE SEED GUIDED PARTIAL MASTECTOMY WITH AXILLARY SENTINEL LYMPH NODE BIOPSY (Left) as a surgical intervention .  The patient's history has been reviewed, patient examined, no change in status, stable for surgery.  I have reviewed the patient's chart and labs.  Questions were answered to the patient's satisfaction.     TOTH III,Jeremian Whitby S

## 2015-02-28 NOTE — Discharge Instructions (Signed)

## 2015-02-28 NOTE — Progress Notes (Signed)
Emotional support during breast injections °

## 2015-02-28 NOTE — Transfer of Care (Signed)
Immediate Anesthesia Transfer of Care Note  Patient: Donna Shaffer  Procedure(s) Performed: Procedure(s): RADIOACTIVE SEED GUIDED PARTIAL MASTECTOMY WITH AXILLARY SENTINEL LYMPH NODE BIOPSY (Left)  Patient Location: PACU  Anesthesia Type:General  Level of Consciousness: awake and patient cooperative  Airway & Oxygen Therapy: Patient Spontanous Breathing and Patient connected to face mask oxygen  Post-op Assessment: Report given to RN and Post -op Vital signs reviewed and stable  Post vital signs: Reviewed and stable  Last Vitals:  Filed Vitals:   02/28/15 0904 02/28/15 0905  BP:    Pulse: 76 79  Temp:    Resp: 21 18    Complications: No apparent anesthesia complications

## 2015-03-03 ENCOUNTER — Encounter (HOSPITAL_BASED_OUTPATIENT_CLINIC_OR_DEPARTMENT_OTHER): Payer: Self-pay | Admitting: General Surgery

## 2015-03-10 ENCOUNTER — Telehealth: Payer: Self-pay | Admitting: Oncology

## 2015-03-10 NOTE — Telephone Encounter (Signed)
new patient appt-s/w patient and gave np appt for 12/20 @ 4:30 MD , 4 Labs Referring Dr. Autumn Messing  Referral information scanned

## 2015-03-10 NOTE — Telephone Encounter (Signed)
new breast appt-s/w patient and gave np appt for 12/20 @ 4:30 w/Dr. Jana Hakim.  Referring Dr. Autumn Messing

## 2015-03-11 ENCOUNTER — Other Ambulatory Visit: Payer: Self-pay | Admitting: *Deleted

## 2015-03-11 ENCOUNTER — Ambulatory Visit (HOSPITAL_BASED_OUTPATIENT_CLINIC_OR_DEPARTMENT_OTHER): Payer: Medicare Other | Admitting: Oncology

## 2015-03-11 ENCOUNTER — Other Ambulatory Visit (HOSPITAL_BASED_OUTPATIENT_CLINIC_OR_DEPARTMENT_OTHER): Payer: Medicare Other

## 2015-03-11 ENCOUNTER — Encounter: Payer: Self-pay | Admitting: *Deleted

## 2015-03-11 VITALS — BP 160/56 | HR 79 | Temp 98.2°F | Resp 18 | Ht 62.0 in | Wt 266.7 lb

## 2015-03-11 DIAGNOSIS — C50412 Malignant neoplasm of upper-outer quadrant of left female breast: Secondary | ICD-10-CM

## 2015-03-11 DIAGNOSIS — C50419 Malignant neoplasm of upper-outer quadrant of unspecified female breast: Secondary | ICD-10-CM | POA: Insufficient documentation

## 2015-03-11 HISTORY — DX: Malignant neoplasm of upper-outer quadrant of left female breast: C50.412

## 2015-03-11 HISTORY — DX: Malignant neoplasm of upper-outer quadrant of unspecified female breast: C50.419

## 2015-03-11 LAB — COMPREHENSIVE METABOLIC PANEL
ALT: 34 U/L (ref 0–55)
AST: 37 U/L — ABNORMAL HIGH (ref 5–34)
Albumin: 3.6 g/dL (ref 3.5–5.0)
Alkaline Phosphatase: 112 U/L (ref 40–150)
Anion Gap: 11 mEq/L (ref 3–11)
BUN: 16 mg/dL (ref 7.0–26.0)
CO2: 28 mEq/L (ref 22–29)
Calcium: 9.5 mg/dL (ref 8.4–10.4)
Chloride: 101 mEq/L (ref 98–109)
Creatinine: 0.8 mg/dL (ref 0.6–1.1)
EGFR: 79 mL/min/{1.73_m2} — ABNORMAL LOW (ref >90)
Glucose: 137 mg/dl (ref 70–140)
Potassium: 4 mEq/L (ref 3.5–5.1)
Sodium: 140 mEq/L (ref 136–145)
Total Bilirubin: 0.44 mg/dL (ref 0.20–1.20)
Total Protein: 7.2 g/dL (ref 6.4–8.3)

## 2015-03-11 LAB — CBC WITH DIFFERENTIAL/PLATELET
BASO%: 0.4 % (ref 0.0–2.0)
Basophils Absolute: 0 10*3/uL (ref 0.0–0.1)
EOS%: 4.1 % (ref 0.0–7.0)
Eosinophils Absolute: 0.4 10*3/uL (ref 0.0–0.5)
HCT: 44.8 % (ref 34.8–46.6)
HGB: 15.2 g/dL (ref 11.6–15.9)
LYMPH%: 23.5 % (ref 14.0–49.7)
MCH: 31.3 pg (ref 25.1–34.0)
MCHC: 33.9 g/dL (ref 31.5–36.0)
MCV: 92.4 fL (ref 79.5–101.0)
MONO#: 1.3 10*3/uL — ABNORMAL HIGH (ref 0.1–0.9)
MONO%: 12 % (ref 0.0–14.0)
NEUT#: 6.5 10*3/uL (ref 1.5–6.5)
NEUT%: 60 % (ref 38.4–76.8)
Platelets: 227 10*3/uL (ref 145–400)
RBC: 4.85 10*6/uL (ref 3.70–5.45)
RDW: 13.8 % (ref 11.2–14.5)
WBC: 10.8 10*3/uL — ABNORMAL HIGH (ref 3.9–10.3)
lymph#: 2.5 10*3/uL (ref 0.9–3.3)

## 2015-03-11 NOTE — Progress Notes (Signed)
[facility]  [phone] [phone]) [phone]     ID: Donna Shaffer DOB: 11/22/1945  MR#: [id number]  XAJ#:[id number]  Patient Care Team: Donna Broker, MD as PCP - General (Family Medicine) Donna Cruel, MD as Consulting Physician (Oncology) Donna Messing III, MD as Consulting Physician (General Surgery) Donna Leyland, MD as Consulting Physician (Unknown Physician Specialty) Donna Stammer, MD (Obstetrics and Gynecology) Donna Neu, MD as Referring Physician (Family Medicine) Donna Bond, MD as Consulting Physician (Orthopedic Surgery) PCP: Donna Broker, MD OTHER MD:  CHIEF COMPLAINT: Estrogen receptor positive breast cancer  CURRENT TREATMENT: Awaiting adjuvant radiation   BREAST CANCER HISTORY: "Donna Shaffer" [facility] bilateral [facility] 01[facility]2015  showing a possible mass in the left breast. Left diagnostic mammography with tomosynthesis and left breast ultrasonography 01/09/2015 found a spiculated mass in the upper outer quadrant of the left breast which on ultrasonography measured 0.68 cm. Ultrasound of the left axilla was benign.  On 01/15/2015 the patient underwent left upper outer quadrant breast biopsy, with the pathology ([id number]) showing an invasive ductal carcinoma, grade 1 or 2, estrogen receptor 100% positive, progesterone receptor 85% positive, both with strong staining intensity, with an MIB-1 of 5% and no HER-2 amplification, the signals ratio being 1.29 and the number per cell 2.52.  On 02/28/2015 the patient underwent radioactive seed guided left lumpectomy with axillary sentinel lymph node sampling. The final pathology from this procedure ([id number]) confirmed an invasive ductal carcinoma measuring 0.8 cm, grade 1, with ample margins, and both sentinel lymph nodes clear. Repeat HER-2 was again negative, with a signals ratio 1.07, the number per cell being 1.60.  The patient's  subsequent history is as detailed below   INTERVAL HISTORY: [name] was evaluated in the breast clinic 03/11/2015 and accompanied by her daughter Donna Shaffer.  REVIEW OF SYSTEMS: There were no specific symptoms leading to the original mammogram, which was routinely scheduled. The patient denies unusual headaches, visual changes, nausea, vomiting, stiff neck, dizziness, or gait imbalance. She has occasional wheezing, but no diagnoses of asthma. There has been no cough, phlegm production, or pleurisy, no chest pain or pressure, and no change in bowel or bladder habits. The patient denies fever, rash, bleeding, unexplained fatigue or unexplained weight loss. She complains of reflux and does have a history of "stomach problems"; she underwent esophageal stricture dilatation October 2016. She does not exercise regularly. A detailed review of systems was otherwise entirely negative.  PAST MEDICAL HISTORY: Past Medical History  Diagnosis Date  . Hypertension   . Anxiety   . Depression   . Cancer ([place])   . Colon polyps     PAST SURGICAL HISTORY: Past Surgical History  Procedure Laterality Date  . Cholecystectomy    . Appendectomy    . Abdominal hysterectomy    . Bilateral oophorectomy    . Radioactive seed guided mastectomy with axillary sentinel lymph node biopsy Left 02/28/2015    Procedure: RADIOACTIVE SEED GUIDED PARTIAL MASTECTOMY WITH AXILLARY SENTINEL LYMPH NODE BIOPSY;  Surgeon: Donna Messing III, MD;  Location: [facility];  Service: General;  Laterality: Left;    FAMILY HISTORY No family history on file. The patient's father died at the age of 24 from complications of COPD in the setting of tobacco abuse. The patient's mother died at the age of 63 from "failure to thrive". The patient had no brothers, one sister. There is no history of breast or ovarian cancer in the family.  GYNECOLOGIC  HISTORY:  No LMP recorded. Patient has had a hysterectomy. Menarche age 21, first live birth  age 35, the patient is Donna Shaffer P2. She had a hysterectomy at the age of 72 and took hormone replacement until her 5s. She subsequently underwent bilateral salpingo-oophorectomy.  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 She is single and lives alone with her 87-year-old dog. Daughter Donna Shaffer lives in [place]. She is disabled secondary to multiple orthopedic problems as well as a history of pulmonary embolism. Son Donna Shaffer lives in [place] where he works for [employer]. The patient has 4 grandchildren. She is not a church attender    ADVANCED DIRECTIVES: Not in place. The patient tells me she intends to name her daughter Donna Shaffer as her healthcare power of attorney. Donna Shaffer can be reached at [place]: Social History  Substance Use Topics  . Smoking status: Never Smoker   . Smokeless tobacco: Never Used  . Alcohol Use: No     Colonoscopy: October 2016/my's and high  PAP: Status post hysterectomy  Bone density: 2014/normal  Lipid panel:  Allergies  Allergen Reactions  . Sulfa Antibiotics Swelling    Current Outpatient Prescriptions  Medication Sig Dispense Refill  . amLODipine (NORVASC) 5 MG tablet Take 5 mg by mouth daily.    Marland Kitchen b complex vitamins tablet Take 1 tablet by mouth daily. Taking one tablet for one week every three weeks    . citalopram (CELEXA) 40 MG tablet Take 40 mg by mouth daily.    . fenoprofen (NALFON) 600 MG TABS tablet Take 600 mg by mouth. Taking one tablet for one week every three weeks.    Marland Kitchen oxyCODONE-acetaminophen (ROXICET) 5-325 MG tablet Take 1-2 tablets by mouth every 4 (four) hours as needed. 50 tablet 0  . pantoprazole (PROTONIX) 40 MG tablet Take 40 mg by mouth daily.    . valsartan-hydrochlorothiazide (DIOVAN-HCT) 320-12.5 MG tablet Take 1 tablet by mouth daily.    . Vitamin Mixture (ESTER-C PO) Take by mouth. Taking one week on and  three weeks off     No current facility-administered medications for this visit.    OBJECTIVE: Middle-aged white woman in no acute distress Filed Vitals:   03/11/15 1622  BP: 160/56  Pulse: 79  Temp: 98.2 F (36.8 C)  Resp: 18     Body mass index is 48.77 kg/(m^2).    ECOG FS:1 - Symptomatic but completely ambulatory  Ocular: Sclerae unicteric, pupils equal, round and reactive to light Ear-nose-throat: Oropharynx clear and moist Lymphatic: No cervical or supraclavicular adenopathy Lungs no rales or rhonchi, good excursion bilaterally Heart regular rate and rhythm, no murmur appreciated Abd soft, obese, nontender, positive bowel sounds MSK no focal spinal tenderness, no joint edema Neuro: non-focal, well-oriented, anxious affect Breasts: The right breast is unremarkable. The left breast is status post recent lumpectomy. The incision is healing nicely, without erythema, swelling, or dehiscence. The cosmetic result is excellent. The left axilla is benign.   LAB RESULTS:  CMP     Component Value Date/Time   NA 140 03/11/2015 1609   NA 140 02/21/2015 1200   K 4.0 03/11/2015 1609   K 4.9 02/21/2015 1200   CL 100* 02/21/2015 1200   CO2 28 03/11/2015 1609   CO2 32 02/21/2015 1200   GLUCOSE 137 03/11/2015 1609   GLUCOSE 119* 02/21/2015 1200   BUN 16.0 03/11/2015 1609  BUN 15 02/21/2015 1200   CREATININE 0.8 03/11/2015 1609   CREATININE 0.92 02/21/2015 1200   CALCIUM 9.5 03/11/2015 1609   CALCIUM 9.4 02/21/2015 1200   PROT 7.2 03/11/2015 1609   ALBUMIN 3.6 03/11/2015 1609   AST 37* 03/11/2015 1609   ALT 34 03/11/2015 1609   ALKPHOS 112 03/11/2015 1609   BILITOT 0.44 03/11/2015 1609   GFRNONAA >60 02/21/2015 1200   GFRAA >60 02/21/2015 1200    INo results found for: SPEP, UPEP  Lab Results  Component Value Date   WBC 10.8* 03/11/2015   NEUTROABS 6.5 03/11/2015   HGB 15.2 03/11/2015   HCT 44.8 03/11/2015   MCV 92.4 03/11/2015   PLT 227 03/11/2015       Chemistry      Component Value Date/Time   NA 140 03/11/2015 1609   NA 140 02/21/2015 1200   K 4.0 03/11/2015 1609   K 4.9 02/21/2015 1200   CL 100* 02/21/2015 1200   CO2 28 03/11/2015 1609   CO2 32 02/21/2015 1200   BUN 16.0 03/11/2015 1609   BUN 15 02/21/2015 1200   CREATININE 0.8 03/11/2015 1609   CREATININE 0.92 02/21/2015 1200      Component Value Date/Time   CALCIUM 9.5 03/11/2015 1609   CALCIUM 9.4 02/21/2015 1200   ALKPHOS 112 03/11/2015 1609   AST 37* 03/11/2015 1609   ALT 34 03/11/2015 1609   BILITOT 0.44 03/11/2015 1609       No results found for: LABCA2  No components found for: LABCA125  No results for input(s): INR in the last 168 hours.  Urinalysis No results found for: COLORURINE, APPEARANCEUR, LABSPEC, PHURINE, GLUCOSEU, HGBUR, BILIRUBINUR, KETONESUR, PROTEINUR, UROBILINOGEN, NITRITE, LEUKOCYTESUR  STUDIES: Nm Sentinel Node Inj-no Rpt (breast)  02/28/2015  CLINICAL DATA: left breast cancer Sulfur colloid was injected intradermally by the nuclear medicine technologist for breast cancer sentinel node localization.   Mm Breast Surgical Specimen  02/28/2015  CLINICAL DATA:  Post left breast lumpectomy EXAM: SPECIMEN RADIOGRAPH OF THE LEFT BREAST COMPARISON:  Previous exam(s). FINDINGS: Status post excision of the left breast. The radioactive seed and biopsy marker clip are present, completely intact, and were marked for pathology. IMPRESSION: Specimen radiograph of the left breast. Electronically Signed   By: Everlean Alstrom M.D.   On: 02/28/2015 10:37   Mm Lt Radioactive Seed Loc Mammo Guide  02/24/2015  CLINICAL DATA:  Preoperative localization for removal of left breast lesion. EXAM: MAMMOGRAPHIC GUIDED RADIOACTIVE SEED LOCALIZATION OF THE LEFT BREAST COMPARISON:  Previous exam(s). FINDINGS: Patient presents for radioactive seed localization prior to surgical excision of left breast lesion. I met with the patient and we discussed the procedure of seed  localization including benefits and alternatives. We discussed the high likelihood of a successful procedure. We discussed the risks of the procedure including infection, bleeding, tissue injury and further surgery. We discussed the low dose of radioactivity involved in the procedure. Informed, written consent was given. The usual time-out protocol was performed immediately prior to the procedure. Using mammographic guidance, sterile technique, 2% lidocaine and an I-125 radioactive seed, the ribbon shaped clip was localized using a lateral approach. The follow-up mammogram images confirm the seed in the expected location and were marked for Dr. Marlou Starks. Follow-up survey of the patient confirms presence of the radioactive seed. Order number of I-125 seed:  [id number]. Total activity:  7.867 millicuries  Reference Date: 02/10/2015 The patient tolerated the procedure well and was released from the [facility]. She was given  instructions regarding seed removal. IMPRESSION: Radioactive seed localization left breast. No apparent complications. Electronically Signed   By: Altamese Cabal M.D.   On: 02/24/2015 14:19    ASSESSMENT: 69 y.o. [place], [place] woman s/p left upper outer quadrant lumpectomy 02/28/2015 for a pT1b pN0, stage IA  invasive ductal carcinoma, grade 1, estrogen and progesterone receptor positive, HER-2 not amplified, with an MIB-1 of 5%.   (1) adjuvant radiation pending  (2) antiestrogen therapy to follow radiation  PLAN: We spent the better part of today's hour-long appointment discussing the biology of breast cancer in general, and the specifics of the patient's tumor in particular. Donna Shaffer understands the difference between local and systemic therapy. She has already had her surgery and is going to have radiation. She understands she needs radiation because there may be residual cancer in the breast even though all the cancer that could be seen has been removed. The fact that the lymph nodes were  clear does not guarantee that all the cancerous left. In fact we find that adjuvant radiation reduces the risk of local recurrence I more than half.  We also discussed the fact that radiation is daily and she Donna Shaffer be driving from [place]. However she tells me she actually lives closer to [place] then [address] [place] so she would prefer to have her radiation treatments here  As far as systemic therapy is concerned she is not a candidate for anti-HER-2 treatment. She is a very good candidate for anti-estrogens and these Donna Shaffer cut the risk of distant recurrence in half. They Donna Shaffer also cut the risk of local recurrence in half. Finally anti-estrogens Donna Shaffer cut in half her risk of developing a new breast cancer in either breast in the future.  We discussed chemotherapy at length. Chemotherapy is very useful in aggressive tumors that are fast-growing. The benefit is marginal or not measurable in slow-growing tumors which are not aggressive. If she had a larger tumor or a positive lymph node we would have sent an Oncotype or Mammaprint but given that her tumor is less than a centimeter and both lymph nodes were negative, I I am comfortable foregoing chemotherapy even without sending an Oncotype. She is agreeable to this decision.  Accordingly the next step is for radiation treatments. She is going to return to see me late February or early March. At that point we Donna Shaffer discuss anti-estrogens, and since she has a history of normal bone density we Donna Shaffer likely start with anastrozole.  Donna Shaffer has a good understanding of the overall plan. She agrees with it. She knows the goal of treatment in her case is cure. She Donna Shaffer call with any problems that may develop before her next visit here.  Donna Cruel, MD   03/11/2015 5:33 PM [facility] [address] [place], [place] [address] Tel. [phone]    Fax. [phone]

## 2015-03-12 ENCOUNTER — Telehealth: Payer: Self-pay | Admitting: Oncology

## 2015-03-12 NOTE — Telephone Encounter (Signed)
Called and left a message with follow up appointment

## 2015-03-14 ENCOUNTER — Encounter: Payer: Self-pay | Admitting: Radiation Oncology

## 2015-03-14 NOTE — Progress Notes (Signed)
Location of Breast Cancer:left upper outer quadrant breast cancer   Histology per Pathology Report:   02/28/15 Diagnosis 1. Lymph node, sentinel, biopsy, left axillary #1 - THERE IS NO EVIDENCE OF CARCINOMA IN 1 OF 1 LYMPH NODE (0/1). 2. Lymph node, sentinel, biopsy, left axillary #2 - THERE IS NO EVIDENCE OF CARCINOMA IN 1 OF 1 LYMPH NODE (0/1). 3. Breast, lumpectomy, left - INVASIVE DUCTAL CARCINOMA, GRADE 1/3, SPANNING 0.8 CM. - DUCTAL CARCINOMA IN SITU, LOW GRADE. - THE SURGICAL RESECTION MARGINS ARE NEGATIVE FOR CARCINOMA. - SEE ONCOLOGY TABLE BELOW.  01/15/15 Diagnosis Breast, left, needle core biopsy, 2:00 o'clock - INVASIVE DUCTAL CARCINOMA. - SEE COMMENT.  Receptor Status: ER(100%), PR (85%), Her2-neu (negative)  Did patient present with symptoms (if so, please note symptoms) or was this found on screening mammography?: screening mammogram on 01/01/15  Past/Anticipated interventions by surgeon, if any: 02/28/15 - Procedure: RADIOACTIVE SEED GUIDED PARTIAL MASTECTOMY WITH AXILLARY SENTINEL LYMPH NODE BIOPSY;  Surgeon: Autumn Messing III, MD;  Location: [facility];  Service: General;  Laterality: Left;  Past/Anticipated interventions by medical oncology, if any: antiestrogen therapy to follow radiation  Lymphedema issues, if any:  no    Pain issues, if any:  no  GYNECOLOGIC HISTORY: Patient has had a hysterectomy. Menarche age 35, first live birth age 15, the patient is [place] P2. She used bcp briefly.  She had a hysterectomy at the age of 18 and took hormone replacement until her 93s (about 5 years). She subsequently underwent bilateral salpingo-oophorectomy.  SAFETY ISSUES:  Prior radiation? no  Pacemaker/ICD? no  Possible current pregnancy?no  Is the patient on methotrexate? no  Current Complaints / other details:   Patient is a retired Programme researcher, broadcasting/film/video.  BP 123/56 mmHg  Pulse 73  Temp(Src) 98.2 F (36.8 C) (Oral)  Resp 16  Ht _0  (1.575 m)  Wt  261 lb 11.2 oz (118.706 kg)  BMI 47.85 kg/m2

## 2015-03-26 ENCOUNTER — Encounter: Payer: Self-pay | Admitting: Radiation Oncology

## 2015-03-26 ENCOUNTER — Ambulatory Visit
Admission: RE | Admit: 2015-03-26 | Discharge: 2015-03-26 | Disposition: A | Discharge status: Home / Self Care | Payer: Medicare Other | Source: Ambulatory Visit | Attending: Radiation Oncology | Admitting: Radiation Oncology

## 2015-03-26 VITALS — BP 123/56 | HR 73 | Temp 98.2°F | Resp 16 | Ht 62.0 in | Wt 261.7 lb

## 2015-03-26 DIAGNOSIS — Z510 Encounter for antineoplastic radiation therapy: Secondary | ICD-10-CM | POA: Insufficient documentation

## 2015-03-26 DIAGNOSIS — C50412 Malignant neoplasm of upper-outer quadrant of left female breast: Secondary | ICD-10-CM | POA: Diagnosis not present

## 2015-03-26 DIAGNOSIS — Z170 Estrogen receptor positive status [ER+]: Secondary | ICD-10-CM | POA: Insufficient documentation

## 2015-03-26 DIAGNOSIS — Z808 Family history of malignant neoplasm of other organs or systems: Secondary | ICD-10-CM | POA: Diagnosis not present

## 2015-03-26 DIAGNOSIS — Z9012 Acquired absence of left breast and nipple: Secondary | ICD-10-CM | POA: Insufficient documentation

## 2015-03-26 HISTORY — DX: Malignant neoplasm of unspecified site of unspecified female breast: C50.919

## 2015-03-26 NOTE — Progress Notes (Signed)
Radiation Oncology         [phone] ________________________________  Initial Outpatient Consultation  Name: Donna Shaffer MRN: [id number]  Date: 03/26/2015  DOB: [birth year]  MQ:KMMNOTR,RNHAFB A, MD  Jovita Kussmaul, MD   REFERRING PHYSICIAN: Autumn Messing III, MD  DIAGNOSIS: The encounter diagnosis was Breast cancer of upper-outer quadrant of left female breast ([place]).  Left upper outer quadrant breast cancer. 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  HISTORY OF PRESENT ILLNESS::Donna Shaffer is a 70 y.o. female who is seen out courtesy of Dr. Autumn Messing for an opinion concerning radiation therapy as part of management of patient's recently diagnosed left breast cancer. The patient had an abnormal mammogram. She had a digital screening mammography at the [facility] 01/01/2015 showing a possible mass in the left breast. Left diagnostic mammography with tomosynthesis and left breast ultrasonography 01/09/2015 found a spiculated mass in the upper outer quadrant of the left breast which on ultrasonography measured 0.68 cm. Ultrasound of the left axilla was benign. On 01/15/2015 the patient underwent left upper outer quadrant breast biopsy, with the pathology showing an invasive ductal carcinoma, grade 1 or 2, estrogen receptor 100% positive, progesterone receptor 85% positive, both with strong straining intensity, with an MIB of 5% and no HER-2 amplification. On 02/28/2015 the patient underwent radioactive seed guided left lumpectomy with axillary sentinel lymph node sampling. The final pathology from this procedure confirmed an invasive ductal carcinoma measuring 0.8 cm, grade 1, with ample margins, and both sentinel lymph nodes clear. Repeat HER-2 was again negative. She has not gone back to work since her surgery.   PREVIOUS RADIATION THERAPY: No  PAST MEDICAL HISTORY:  has a past medical history of Hypertension; Anxiety; Depression;  Cancer ([place]); Colon polyps; and Breast cancer ([place]).    PAST SURGICAL HISTORY: Past Surgical History  Procedure Laterality Date  . Cholecystectomy    . Appendectomy    . Abdominal hysterectomy    . Bilateral oophorectomy    . Radioactive seed guided mastectomy with axillary sentinel lymph node biopsy Left 02/28/2015    Procedure: RADIOACTIVE SEED GUIDED PARTIAL MASTECTOMY WITH AXILLARY SENTINEL LYMPH NODE BIOPSY;  Surgeon: Autumn Messing III, MD;  Location: [facility];  Service: General;  Laterality: Left;  . Cataract extraction Bilateral     FAMILY HISTORY: family history includes Melanoma in her mother; Skin cancer in her father.  SOCIAL HISTORY:  reports that she has never smoked. She has never used smokeless tobacco. She reports that she does not drink alcohol or use illicit drugs. works as a Emergency planning/management officer  ALLERGIES: Sulfa antibiotics  MEDICATIONS:  Current Outpatient Prescriptions  Medication Sig Dispense Refill  . amLODipine (NORVASC) 5 MG tablet Take 5 mg by mouth daily.    Marland Kitchen b complex vitamins tablet Take 1 tablet by mouth daily. Taking one tablet for one week every three weeks    . citalopram (CELEXA) 40 MG tablet Take 40 mg by mouth daily.    . pantoprazole (PROTONIX) 40 MG tablet Take 40 mg by mouth daily.    . valsartan-hydrochlorothiazide (DIOVAN-HCT) 320-12.5 MG tablet Take 1 tablet by mouth daily.    . Vitamin Mixture (ESTER-C PO) Take by mouth. Taking one week on and three weeks off    . fenoprofen (NALFON) 600 MG TABS tablet Take 600 mg by mouth. Reported on 03/26/2015     No current facility-administered medications for this encounter.    REVIEW  OF SYSTEMS:  A 15 point review of systems is documented in the electronic medical record. This was obtained by the nursing staff. However, I reviewed this with the patient to discuss relevant findings and make appropriate changes.  Pertinent items are noted in HPI. no nipple discharge or bleeding prior to  biopsy   PHYSICAL EXAM:  height is 5' 2"  (1.575 m) and weight is 261 lb 11.2 oz (118.706 kg). Her oral temperature is 98.2 F (36.8 C). Her blood pressure is 123/56 and her pulse is 73. Her respiration is 16.   General: Alert and oriented, in no acute distress HEENT: Head is normocephalic. Extraocular movements are intact. Oropharynx is clear. Neck: Neck is supple, no palpable cervical or supraclavicular lymphadenopathy. Heart: Regular in rate and rhythm with no murmurs, rubs, or gallops. Chest: Clear to auscultation bilaterally, with no rhonchi, wheezes, or rales. Abdomen: Soft, nontender, nondistended, with no rigidity or guarding. She has a vertical scar on her abdomen from prior cholecystectomy Extremities: No cyanosis. Edema in her lower ankles. Lymphatics: see Neck Exam Skin: No concerning lesions. Musculoskeletal: symmetric strength and muscle tone throughout. Neurologic: Cranial nerves II through XII are grossly intact. No obvious focalities. Speech is fluent. Coordination is intact. Psychiatric: Judgment and insight are intact. Affect is appropriate.  She has a scar on the upper outer quadrant of her left breast from surgery. She had a smaller scar in lower axillar area from sentinel node procedure. Both healing well no signs of infection.   ECOG = 1  LABORATORY DATA:  Lab Results  Component Value Date   WBC 10.8* 03/11/2015   HGB 15.2 03/11/2015   HCT 44.8 03/11/2015   MCV 92.4 03/11/2015   PLT 227 03/11/2015   NEUTROABS 6.5 03/11/2015   Lab Results  Component Value Date   NA 140 03/11/2015   K 4.0 03/11/2015   CL 100* 02/21/2015   CO2 28 03/11/2015   GLUCOSE 137 03/11/2015   CREATININE 0.8 03/11/2015   CALCIUM 9.5 03/11/2015      RADIOGRAPHY: Nm Sentinel Node Inj-no Rpt (breast)  02/28/2015  CLINICAL DATA: left breast cancer Sulfur colloid was injected intradermally by the nuclear medicine technologist for breast cancer sentinel node localization.   Mm Breast  Surgical Specimen  02/28/2015  CLINICAL DATA:  Post left breast lumpectomy EXAM: SPECIMEN RADIOGRAPH OF THE LEFT BREAST COMPARISON:  Previous exam(s). FINDINGS: Status post excision of the left breast. The radioactive seed and biopsy marker clip are present, completely intact, and were marked for pathology. IMPRESSION: Specimen radiograph of the left breast. Electronically Signed   By: Everlean Alstrom M.D.   On: 02/28/2015 10:37   Mm Lt Radioactive Seed Loc Mammo Guide  02/24/2015  CLINICAL DATA:  Preoperative localization for removal of left breast lesion. EXAM: MAMMOGRAPHIC GUIDED RADIOACTIVE SEED LOCALIZATION OF THE LEFT BREAST COMPARISON:  Previous exam(s). FINDINGS: Patient presents for radioactive seed localization prior to surgical excision of left breast lesion. I met with the patient and we discussed the procedure of seed localization including benefits and alternatives. We discussed the high likelihood of a successful procedure. We discussed the risks of the procedure including infection, bleeding, tissue injury and further surgery. We discussed the low dose of radioactivity involved in the procedure. Informed, written consent was given. The usual time-out protocol was performed immediately prior to the procedure. Using mammographic guidance, sterile technique, 2% lidocaine and an I-125 radioactive seed, the ribbon shaped clip was localized using a lateral approach. The follow-up mammogram images confirm  the seed in the expected location and were marked for Dr. Marlou Starks. Follow-up survey of the patient confirms presence of the radioactive seed. Order number of I-125 seed:  [id number]. Total activity:  8.416 millicuries  Reference Date: 02/10/2015 The patient tolerated the procedure well and was released from the [facility]. She was given instructions regarding seed removal. IMPRESSION: Radioactive seed localization left breast. No apparent complications. Electronically Signed   By: Altamese Cabal  M.D.   On: 02/24/2015 14:19      IMPRESSION: Ms. Assefa is a 70 yo woman with invasive ductal carcinoma of the left breast. She is a good candidate for breast conservation therapy with radiation directed at the left breast.  PLAN: We discussed the side effects of treatment and a consent form was signed. She has a simulation planning appointment scheduled for 04/01/2015 at 11 am.  ------------------------------------------------  Blair Promise, PhD, MD    This document serves as a record of services personally performed by Gery Pray, MD. It was created on his behalf by Lendon Collar, a trained medical scribe. The creation of this record is based on the scribe's personal observations and the provider's statements to them. This document has been checked and approved by the attending provider.

## 2015-03-26 NOTE — Progress Notes (Signed)
Please see the Nurse Progress Note in the MD Initial Consult Encounter for this patient. 

## 2015-03-27 NOTE — Addendum Note (Signed)
Encounter addended by: Jacqulyn Liner, RN on: 03/27/2015  3:27 PM<BR>     Documentation filed: Charges VN

## 2015-04-01 ENCOUNTER — Ambulatory Visit
Admission: RE | Admit: 2015-04-01 | Discharge: 2015-04-01 | Disposition: A | Discharge status: Home / Self Care | Payer: Medicare Other | Source: Ambulatory Visit | Attending: Radiation Oncology | Admitting: Radiation Oncology

## 2015-04-01 DIAGNOSIS — Z170 Estrogen receptor positive status [ER+]: Secondary | ICD-10-CM | POA: Diagnosis not present

## 2015-04-01 DIAGNOSIS — Z510 Encounter for antineoplastic radiation therapy: Secondary | ICD-10-CM | POA: Diagnosis not present

## 2015-04-01 DIAGNOSIS — C50412 Malignant neoplasm of upper-outer quadrant of left female breast: Secondary | ICD-10-CM | POA: Diagnosis not present

## 2015-04-01 DIAGNOSIS — Z808 Family history of malignant neoplasm of other organs or systems: Secondary | ICD-10-CM | POA: Diagnosis not present

## 2015-04-01 DIAGNOSIS — Z9012 Acquired absence of left breast and nipple: Secondary | ICD-10-CM | POA: Diagnosis not present

## 2015-04-01 NOTE — Progress Notes (Signed)
  Radiation Oncology         [phone] ________________________________  Name: Donna Shaffer MRN: [id number]  Date: 04/01/2015  DOB: [birth year]  SIMULATION AND TREATMENT PLANNING NOTE    ICD-9-CM ICD-10-CM   1. Breast cancer of upper-outer quadrant of left female breast ([place]) 174.4 C50.412     DIAGNOSIS:  Left upper outer quadrant breast cancer. 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  NARRATIVE:  The patient was brought to the [facility].  Identity was confirmed.  All relevant records and images related to the planned course of therapy were reviewed.  The patient freely provided informed written consent to proceed with treatment after reviewing the details related to the planned course of therapy. The consent form was witnessed and verified by the simulation staff.  Then, the patient was set-up in a stable reproducible  supine position for radiation therapy.  CT images were obtained.  Surface markings were placed.  The CT images were loaded into the planning software.  Then the target and avoidance structures were contoured.  Treatment planning then occurred.  The radiation prescription was entered and confirmed.  Then, I designed and supervised the construction of a total of 3 medically necessary complex treatment devices.  I have requested : 3D Simulation  I have requested a DVH of the following structures: Lungs, heart,  lumpectomy cavity.  I have ordered:dose calc.  PLAN:  The patient will receive 50.4 Gy in 28 fractions followed by a boost to the lumpectomy cavity of 10 gray and a cumulative dose of 60.4 gray  ________________________________  -----------------------------------  Blair Promise, PhD, MD

## 2015-04-04 ENCOUNTER — Encounter: Payer: Self-pay | Admitting: *Deleted

## 2015-04-04 NOTE — Progress Notes (Signed)
[facility] Psychosocial Distress Screening Clinical Social Work  Clinical Social Work was referred by distress screening protocol.  The patient scored a 7 on the Psychosocial Distress Thermometer which indicates severe distress. Clinical Social Worker phoned pt to assess for distress and other psychosocial needs. Clinical Social Worker had to leave a message and briefly explained role of CSW/Pt and Family Support Team. CSW awaits return call.   ONCBCN DISTRESS SCREENING 03/26/2015  Screening Type Initial Screening  Distress experienced in past week (1-10) 7  Practical problem type Housing;Food  Emotional problem type Nervousness/Anxiety;Feeling hopeless  Information Concerns Type Lack of info about complementary therapy choices;Lack of info about maintaining fitness  Physical Problem type Bathing/dressing;Skin dry/itchy    Clinical Social Worker follow up needed: Yes.    If yes, follow up plan: See above Loren Racer, [place] Worker [place]  [facility] Phone: [phone] Fax: [phone]

## 2015-04-07 DIAGNOSIS — Z808 Family history of malignant neoplasm of other organs or systems: Secondary | ICD-10-CM | POA: Diagnosis not present

## 2015-04-07 DIAGNOSIS — C50412 Malignant neoplasm of upper-outer quadrant of left female breast: Secondary | ICD-10-CM | POA: Diagnosis not present

## 2015-04-07 DIAGNOSIS — Z9012 Acquired absence of left breast and nipple: Secondary | ICD-10-CM | POA: Diagnosis not present

## 2015-04-07 DIAGNOSIS — Z510 Encounter for antineoplastic radiation therapy: Secondary | ICD-10-CM | POA: Diagnosis not present

## 2015-04-07 DIAGNOSIS — Z170 Estrogen receptor positive status [ER+]: Secondary | ICD-10-CM | POA: Diagnosis not present

## 2015-04-08 ENCOUNTER — Ambulatory Visit: Payer: Medicare Other | Admitting: Radiation Oncology

## 2015-04-09 ENCOUNTER — Ambulatory Visit
Admission: RE | Admit: 2015-04-09 | Discharge: 2015-04-09 | Disposition: A | Discharge status: Home / Self Care | Payer: Medicare Other | Source: Ambulatory Visit | Attending: Radiation Oncology | Admitting: Radiation Oncology

## 2015-04-09 DIAGNOSIS — C50412 Malignant neoplasm of upper-outer quadrant of left female breast: Secondary | ICD-10-CM

## 2015-04-09 DIAGNOSIS — Z808 Family history of malignant neoplasm of other organs or systems: Secondary | ICD-10-CM | POA: Diagnosis not present

## 2015-04-09 DIAGNOSIS — Z9012 Acquired absence of left breast and nipple: Secondary | ICD-10-CM | POA: Diagnosis not present

## 2015-04-09 DIAGNOSIS — Z510 Encounter for antineoplastic radiation therapy: Secondary | ICD-10-CM | POA: Diagnosis not present

## 2015-04-09 DIAGNOSIS — Z170 Estrogen receptor positive status [ER+]: Secondary | ICD-10-CM | POA: Diagnosis not present

## 2015-04-09 NOTE — Progress Notes (Signed)
  Radiation Oncology         [phone] ________________________________  Name: BRITTANY WILLCUT MRN: [id number]  Date: 04/09/2015  DOB: [birth year]  Simulation Verification Note   Status: outpatient  NARRATIVE: The patient was brought to the treatment unit and placed in the planned treatment position. The clinical setup was verified. Then port films were obtained and uploaded to the radiation oncology medical record software.  The treatment beams were carefully compared against the planned radiation fields. The position location and shape of the radiation fields was reviewed. They targeted volume of tissue appears to be appropriately covered by the radiation beams. Organs at risk appear to be excluded as planned.  Based on my personal review, I approved the simulation verification. The patient's treatment will proceed as planned.  -----------------------------------  Blair Promise, PhD, MD    This document serves as a record of services personally performed by Gery Pray, MD. It was created on his behalf by Lendon Collar, a trained medical scribe. The creation of this record is based on the scribe's personal observations and the provider's statements to them. This document has been checked and approved by the attending provider.

## 2015-04-10 ENCOUNTER — Ambulatory Visit
Admission: RE | Admit: 2015-04-10 | Discharge: 2015-04-10 | Disposition: A | Discharge status: Home / Self Care | Payer: Medicare Other | Source: Ambulatory Visit | Attending: Radiation Oncology | Admitting: Radiation Oncology

## 2015-04-10 DIAGNOSIS — Z808 Family history of malignant neoplasm of other organs or systems: Secondary | ICD-10-CM | POA: Diagnosis not present

## 2015-04-10 DIAGNOSIS — Z510 Encounter for antineoplastic radiation therapy: Secondary | ICD-10-CM | POA: Diagnosis not present

## 2015-04-10 DIAGNOSIS — C50412 Malignant neoplasm of upper-outer quadrant of left female breast: Secondary | ICD-10-CM | POA: Diagnosis not present

## 2015-04-10 DIAGNOSIS — Z9012 Acquired absence of left breast and nipple: Secondary | ICD-10-CM | POA: Diagnosis not present

## 2015-04-10 DIAGNOSIS — Z170 Estrogen receptor positive status [ER+]: Secondary | ICD-10-CM | POA: Diagnosis not present

## 2015-04-11 ENCOUNTER — Ambulatory Visit
Admission: RE | Admit: 2015-04-11 | Discharge: 2015-04-11 | Disposition: A | Discharge status: Home / Self Care | Payer: Medicare Other | Source: Ambulatory Visit | Attending: Radiation Oncology | Admitting: Radiation Oncology

## 2015-04-11 DIAGNOSIS — C50412 Malignant neoplasm of upper-outer quadrant of left female breast: Secondary | ICD-10-CM

## 2015-04-11 DIAGNOSIS — Z808 Family history of malignant neoplasm of other organs or systems: Secondary | ICD-10-CM | POA: Diagnosis not present

## 2015-04-11 DIAGNOSIS — Z170 Estrogen receptor positive status [ER+]: Secondary | ICD-10-CM | POA: Diagnosis not present

## 2015-04-11 DIAGNOSIS — Z510 Encounter for antineoplastic radiation therapy: Secondary | ICD-10-CM | POA: Diagnosis not present

## 2015-04-11 DIAGNOSIS — Z9012 Acquired absence of left breast and nipple: Secondary | ICD-10-CM | POA: Diagnosis not present

## 2015-04-11 MED ORDER — ALRA NON-METALLIC DEODORANT (RAD-ONC)
1.0000 "application " | Freq: Once | TOPICAL | Status: AC
  Administered 2015-04-11: 1 via TOPICAL

## 2015-04-11 MED ORDER — RADIAPLEXRX EX GEL
Freq: Once | CUTANEOUS | Status: AC
  Administered 2015-04-11: 16:00:00 via TOPICAL

## 2015-04-11 NOTE — Progress Notes (Signed)
Pt here for patient teaching.  Pt given Radiation and You booklet, skin care instructions, Alra deodorant and Radiaplex gel. Pt reports they have not watched the Radiation Therapy Education video and has been given a link to watch the video at home.  Reviewed areas of pertinence such as fatigue, skin changes, breast tenderness and breast swelling . Pt able to give teach back of to pat skin and use unscented/gentle soap,apply Radiaplex bid, avoid applying anything to skin within 4 hours of treatment and avoid wearing an under wire bra. Pt demonstrated understanding and verbalizes understanding of information given and will contact nursing with any questions or concerns.     Http://rtanswers.org/treatmentinformation/whattoexpect/index

## 2015-04-14 ENCOUNTER — Ambulatory Visit
Admission: RE | Admit: 2015-04-14 | Discharge: 2015-04-14 | Disposition: A | Discharge status: Home / Self Care | Payer: Medicare Other | Source: Ambulatory Visit | Attending: Radiation Oncology | Admitting: Radiation Oncology

## 2015-04-14 DIAGNOSIS — Z9012 Acquired absence of left breast and nipple: Secondary | ICD-10-CM | POA: Diagnosis not present

## 2015-04-14 DIAGNOSIS — Z510 Encounter for antineoplastic radiation therapy: Secondary | ICD-10-CM | POA: Diagnosis not present

## 2015-04-14 DIAGNOSIS — Z170 Estrogen receptor positive status [ER+]: Secondary | ICD-10-CM | POA: Diagnosis not present

## 2015-04-14 DIAGNOSIS — Z808 Family history of malignant neoplasm of other organs or systems: Secondary | ICD-10-CM | POA: Diagnosis not present

## 2015-04-14 DIAGNOSIS — C50412 Malignant neoplasm of upper-outer quadrant of left female breast: Secondary | ICD-10-CM | POA: Diagnosis not present

## 2015-04-15 ENCOUNTER — Ambulatory Visit
Admission: RE | Admit: 2015-04-15 | Discharge: 2015-04-15 | Disposition: A | Discharge status: Home / Self Care | Payer: Medicare Other | Source: Ambulatory Visit | Attending: Radiation Oncology | Admitting: Radiation Oncology

## 2015-04-15 ENCOUNTER — Encounter: Payer: Self-pay | Admitting: Radiation Oncology

## 2015-04-15 VITALS — BP 132/59 | HR 70 | Temp 97.9°F | Ht 62.0 in | Wt 260.4 lb

## 2015-04-15 DIAGNOSIS — Z9012 Acquired absence of left breast and nipple: Secondary | ICD-10-CM | POA: Diagnosis not present

## 2015-04-15 DIAGNOSIS — Z510 Encounter for antineoplastic radiation therapy: Secondary | ICD-10-CM | POA: Diagnosis not present

## 2015-04-15 DIAGNOSIS — C50412 Malignant neoplasm of upper-outer quadrant of left female breast: Secondary | ICD-10-CM | POA: Diagnosis not present

## 2015-04-15 DIAGNOSIS — Z808 Family history of malignant neoplasm of other organs or systems: Secondary | ICD-10-CM | POA: Diagnosis not present

## 2015-04-15 DIAGNOSIS — Z170 Estrogen receptor positive status [ER+]: Secondary | ICD-10-CM | POA: Diagnosis not present

## 2015-04-15 NOTE — Progress Notes (Signed)
Donna Shaffer is here for her 5th fraction of radiation to her Left Breast. She does have some fatigue, but relates this to other health problems. The skin over her breast is normal in appearance. She has not started using the radiaplex cream, but will start today. She has no other concerns at this time.   BP 132/59 mmHg  Pulse 70  Temp(Src) 97.9 F (36.6 C)  Ht 5\' 2"  (1.575 m)  Wt 260 lb 6.4 oz (118.117 kg)  BMI 47.62 kg/m2

## 2015-04-15 NOTE — Progress Notes (Signed)
  Radiation Oncology         [phone]) [phone] ________________________________  Name: Donna Shaffer MRN: [id number]  Date: 04/15/2015  DOB: 02/05/1946  Weekly Radiation Therapy Management    ICD-9-CM ICD-10-CM   1. Breast cancer of upper-outer quadrant of left female breast ([facility]) 174.4 C50.412       Current Dose: 9 Gy     Planned Dose:  60.4 Gy  Narrative . . . . . . . . The patient presents for routine under treatment assessment.                                   The patient is without complaint. She has some fatigue but relates this to other medical issues.                                 Set-up films were reviewed.                                 The chart was checked. Physical Findings. . .  height is 5\' 2"  (1.575 m) and weight is 260 lb 6.4 oz (118.117 kg). Her temperature is 97.9 F (36.6 C). Her blood pressure is 132/59 and her pulse is 70. . The lungs are clear. The heart has a regular rhythm and rate. The left breast area shows some erythema. Impression . . . . . . . The patient is tolerating radiation. Plan . . . . . . . . . . . . Continue treatment as planned.  ________________________________   Blair Promise, PhD, MD

## 2015-04-16 ENCOUNTER — Ambulatory Visit
Admission: RE | Admit: 2015-04-16 | Discharge: 2015-04-16 | Disposition: A | Discharge status: Home / Self Care | Payer: Medicare Other | Source: Ambulatory Visit | Attending: Radiation Oncology | Admitting: Radiation Oncology

## 2015-04-16 ENCOUNTER — Telehealth: Payer: Self-pay | Admitting: *Deleted

## 2015-04-16 DIAGNOSIS — Z170 Estrogen receptor positive status [ER+]: Secondary | ICD-10-CM | POA: Diagnosis not present

## 2015-04-16 DIAGNOSIS — Z510 Encounter for antineoplastic radiation therapy: Secondary | ICD-10-CM | POA: Diagnosis not present

## 2015-04-16 DIAGNOSIS — Z808 Family history of malignant neoplasm of other organs or systems: Secondary | ICD-10-CM | POA: Diagnosis not present

## 2015-04-16 DIAGNOSIS — Z9012 Acquired absence of left breast and nipple: Secondary | ICD-10-CM | POA: Diagnosis not present

## 2015-04-16 DIAGNOSIS — C50412 Malignant neoplasm of upper-outer quadrant of left female breast: Secondary | ICD-10-CM | POA: Diagnosis not present

## 2015-04-16 NOTE — Telephone Encounter (Signed)
Left message for a return phone call to follow up after start of radiation.   

## 2015-04-17 ENCOUNTER — Ambulatory Visit
Admission: RE | Admit: 2015-04-17 | Discharge: 2015-04-17 | Disposition: A | Discharge status: Home / Self Care | Payer: Medicare Other | Source: Ambulatory Visit | Attending: Radiation Oncology | Admitting: Radiation Oncology

## 2015-04-17 DIAGNOSIS — Z808 Family history of malignant neoplasm of other organs or systems: Secondary | ICD-10-CM | POA: Diagnosis not present

## 2015-04-17 DIAGNOSIS — C50412 Malignant neoplasm of upper-outer quadrant of left female breast: Secondary | ICD-10-CM | POA: Diagnosis not present

## 2015-04-17 DIAGNOSIS — Z510 Encounter for antineoplastic radiation therapy: Secondary | ICD-10-CM | POA: Diagnosis not present

## 2015-04-17 DIAGNOSIS — Z170 Estrogen receptor positive status [ER+]: Secondary | ICD-10-CM | POA: Diagnosis not present

## 2015-04-17 DIAGNOSIS — Z9012 Acquired absence of left breast and nipple: Secondary | ICD-10-CM | POA: Diagnosis not present

## 2015-04-18 ENCOUNTER — Ambulatory Visit
Admission: RE | Admit: 2015-04-18 | Discharge: 2015-04-18 | Disposition: A | Discharge status: Home / Self Care | Payer: Medicare Other | Source: Ambulatory Visit | Attending: Radiation Oncology | Admitting: Radiation Oncology

## 2015-04-18 DIAGNOSIS — Z808 Family history of malignant neoplasm of other organs or systems: Secondary | ICD-10-CM | POA: Diagnosis not present

## 2015-04-18 DIAGNOSIS — C50412 Malignant neoplasm of upper-outer quadrant of left female breast: Secondary | ICD-10-CM | POA: Diagnosis not present

## 2015-04-18 DIAGNOSIS — Z9012 Acquired absence of left breast and nipple: Secondary | ICD-10-CM | POA: Diagnosis not present

## 2015-04-18 DIAGNOSIS — Z510 Encounter for antineoplastic radiation therapy: Secondary | ICD-10-CM | POA: Diagnosis not present

## 2015-04-18 DIAGNOSIS — Z170 Estrogen receptor positive status [ER+]: Secondary | ICD-10-CM | POA: Diagnosis not present

## 2015-04-21 ENCOUNTER — Ambulatory Visit
Admission: RE | Admit: 2015-04-21 | Discharge: 2015-04-21 | Disposition: A | Discharge status: Home / Self Care | Payer: Medicare Other | Source: Ambulatory Visit | Attending: Radiation Oncology | Admitting: Radiation Oncology

## 2015-04-21 DIAGNOSIS — Z510 Encounter for antineoplastic radiation therapy: Secondary | ICD-10-CM | POA: Diagnosis not present

## 2015-04-21 DIAGNOSIS — Z808 Family history of malignant neoplasm of other organs or systems: Secondary | ICD-10-CM | POA: Diagnosis not present

## 2015-04-21 DIAGNOSIS — Z170 Estrogen receptor positive status [ER+]: Secondary | ICD-10-CM | POA: Diagnosis not present

## 2015-04-21 DIAGNOSIS — Z9012 Acquired absence of left breast and nipple: Secondary | ICD-10-CM | POA: Diagnosis not present

## 2015-04-21 DIAGNOSIS — C50412 Malignant neoplasm of upper-outer quadrant of left female breast: Secondary | ICD-10-CM | POA: Diagnosis not present

## 2015-04-22 ENCOUNTER — Ambulatory Visit
Admission: RE | Admit: 2015-04-22 | Discharge: 2015-04-22 | Disposition: A | Discharge status: Home / Self Care | Payer: Medicare Other | Source: Ambulatory Visit | Attending: Radiation Oncology | Admitting: Radiation Oncology

## 2015-04-22 ENCOUNTER — Encounter: Payer: Self-pay | Admitting: Radiation Oncology

## 2015-04-22 VITALS — BP 136/56 | HR 75 | Temp 97.4°F | Ht 62.0 in | Wt 261.4 lb

## 2015-04-22 DIAGNOSIS — C50412 Malignant neoplasm of upper-outer quadrant of left female breast: Secondary | ICD-10-CM | POA: Diagnosis not present

## 2015-04-22 DIAGNOSIS — Z808 Family history of malignant neoplasm of other organs or systems: Secondary | ICD-10-CM | POA: Diagnosis not present

## 2015-04-22 DIAGNOSIS — Z170 Estrogen receptor positive status [ER+]: Secondary | ICD-10-CM | POA: Diagnosis not present

## 2015-04-22 DIAGNOSIS — Z510 Encounter for antineoplastic radiation therapy: Secondary | ICD-10-CM | POA: Diagnosis not present

## 2015-04-22 DIAGNOSIS — Z9012 Acquired absence of left breast and nipple: Secondary | ICD-10-CM | POA: Diagnosis not present

## 2015-04-22 NOTE — Progress Notes (Signed)
Donna Shaffer has received 10 fractions to her left breast.  Note erythema of her breast.  Inframmary fold intact and without any erythema.   She reports "a little bit" of fatigue.

## 2015-04-22 NOTE — Progress Notes (Signed)
  Radiation Oncology         [phone] ________________________________  Name: Donna Shaffer MRN: [id number]  Date: 04/22/2015  DOB: Jun 10, 1945  Weekly Radiation Therapy Management    ICD-9-CM ICD-10-CM   1. Breast cancer of upper-outer quadrant of left female breast ([facility]) 174.4 C50.412      Current Dose: 18 Gy     Planned Dose:  60.4 Gy  Narrative . . . . . . . . The patient presents for routine under treatment assessment.                                   The patient is without complaint. No itching or discomfort within the breast or fatigue                                 Set-up films were reviewed.                                 The chart was checked. Physical Findings. . .  height is 5\' 2"  (1.575 m) and weight is 261 lb 6.4 oz (118.57 kg). Her temperature is 97.4 F (36.3 C). Her blood pressure is 136/56 and her pulse is 75. . Mild erythema to the central breast area Impression . . . . . . . The patient is tolerating radiation. Plan . . . . . . . . . . . . Continue treatment as planned.  ________________________________   Blair Promise, PhD, MD

## 2015-04-23 ENCOUNTER — Ambulatory Visit
Admission: RE | Admit: 2015-04-23 | Discharge: 2015-04-23 | Disposition: A | Discharge status: Home / Self Care | Payer: Medicare Other | Source: Ambulatory Visit | Attending: Radiation Oncology | Admitting: Radiation Oncology

## 2015-04-23 DIAGNOSIS — Z808 Family history of malignant neoplasm of other organs or systems: Secondary | ICD-10-CM | POA: Diagnosis not present

## 2015-04-23 DIAGNOSIS — C50412 Malignant neoplasm of upper-outer quadrant of left female breast: Secondary | ICD-10-CM | POA: Diagnosis not present

## 2015-04-23 DIAGNOSIS — Z510 Encounter for antineoplastic radiation therapy: Secondary | ICD-10-CM | POA: Diagnosis not present

## 2015-04-23 DIAGNOSIS — Z9012 Acquired absence of left breast and nipple: Secondary | ICD-10-CM | POA: Diagnosis not present

## 2015-04-23 DIAGNOSIS — Z170 Estrogen receptor positive status [ER+]: Secondary | ICD-10-CM | POA: Diagnosis not present

## 2015-04-24 ENCOUNTER — Ambulatory Visit
Admission: RE | Admit: 2015-04-24 | Discharge: 2015-04-24 | Disposition: A | Discharge status: Home / Self Care | Payer: Medicare Other | Source: Ambulatory Visit | Attending: Radiation Oncology | Admitting: Radiation Oncology

## 2015-04-24 DIAGNOSIS — C50412 Malignant neoplasm of upper-outer quadrant of left female breast: Secondary | ICD-10-CM | POA: Diagnosis not present

## 2015-04-24 DIAGNOSIS — Z510 Encounter for antineoplastic radiation therapy: Secondary | ICD-10-CM | POA: Diagnosis not present

## 2015-04-24 DIAGNOSIS — Z9012 Acquired absence of left breast and nipple: Secondary | ICD-10-CM | POA: Diagnosis not present

## 2015-04-24 DIAGNOSIS — Z808 Family history of malignant neoplasm of other organs or systems: Secondary | ICD-10-CM | POA: Diagnosis not present

## 2015-04-24 DIAGNOSIS — Z170 Estrogen receptor positive status [ER+]: Secondary | ICD-10-CM | POA: Diagnosis not present

## 2015-04-25 ENCOUNTER — Ambulatory Visit
Admission: RE | Admit: 2015-04-25 | Discharge: 2015-04-25 | Disposition: A | Discharge status: Home / Self Care | Payer: Medicare Other | Source: Ambulatory Visit | Attending: Radiation Oncology | Admitting: Radiation Oncology

## 2015-04-25 DIAGNOSIS — Z9012 Acquired absence of left breast and nipple: Secondary | ICD-10-CM | POA: Diagnosis not present

## 2015-04-25 DIAGNOSIS — C50412 Malignant neoplasm of upper-outer quadrant of left female breast: Secondary | ICD-10-CM | POA: Diagnosis not present

## 2015-04-25 DIAGNOSIS — Z510 Encounter for antineoplastic radiation therapy: Secondary | ICD-10-CM | POA: Diagnosis not present

## 2015-04-25 DIAGNOSIS — Z170 Estrogen receptor positive status [ER+]: Secondary | ICD-10-CM | POA: Diagnosis not present

## 2015-04-25 DIAGNOSIS — Z808 Family history of malignant neoplasm of other organs or systems: Secondary | ICD-10-CM | POA: Diagnosis not present

## 2015-04-28 ENCOUNTER — Ambulatory Visit
Admission: RE | Admit: 2015-04-28 | Discharge: 2015-04-28 | Disposition: A | Discharge status: Home / Self Care | Payer: Medicare Other | Source: Ambulatory Visit | Attending: Radiation Oncology | Admitting: Radiation Oncology

## 2015-04-28 DIAGNOSIS — Z510 Encounter for antineoplastic radiation therapy: Secondary | ICD-10-CM | POA: Diagnosis not present

## 2015-04-28 DIAGNOSIS — Z170 Estrogen receptor positive status [ER+]: Secondary | ICD-10-CM | POA: Diagnosis not present

## 2015-04-28 DIAGNOSIS — Z808 Family history of malignant neoplasm of other organs or systems: Secondary | ICD-10-CM | POA: Diagnosis not present

## 2015-04-28 DIAGNOSIS — Z9012 Acquired absence of left breast and nipple: Secondary | ICD-10-CM | POA: Diagnosis not present

## 2015-04-28 DIAGNOSIS — C50412 Malignant neoplasm of upper-outer quadrant of left female breast: Secondary | ICD-10-CM | POA: Diagnosis not present

## 2015-04-29 ENCOUNTER — Ambulatory Visit
Admission: RE | Admit: 2015-04-29 | Discharge: 2015-04-29 | Disposition: A | Discharge status: Home / Self Care | Payer: Medicare Other | Source: Ambulatory Visit | Attending: Radiation Oncology | Admitting: Radiation Oncology

## 2015-04-29 ENCOUNTER — Encounter: Payer: Self-pay | Admitting: Radiation Oncology

## 2015-04-29 VITALS — BP 121/51 | HR 74 | Temp 98.7°F | Resp 18 | Ht 62.0 in | Wt 265.1 lb

## 2015-04-29 DIAGNOSIS — Z170 Estrogen receptor positive status [ER+]: Secondary | ICD-10-CM | POA: Diagnosis not present

## 2015-04-29 DIAGNOSIS — Z510 Encounter for antineoplastic radiation therapy: Secondary | ICD-10-CM | POA: Diagnosis not present

## 2015-04-29 DIAGNOSIS — Z9012 Acquired absence of left breast and nipple: Secondary | ICD-10-CM | POA: Diagnosis not present

## 2015-04-29 DIAGNOSIS — C50412 Malignant neoplasm of upper-outer quadrant of left female breast: Secondary | ICD-10-CM

## 2015-04-29 DIAGNOSIS — Z808 Family history of malignant neoplasm of other organs or systems: Secondary | ICD-10-CM | POA: Diagnosis not present

## 2015-04-29 NOTE — Progress Notes (Signed)
Hayly is here for her under treat appt, she has received 15 fractions to her left breast, pt states fatigue but finds relief with naps and resting on the weekends, pt states she is eating well, states some itching and discomfort around the nipple, redness noted to left breast area but no peeling, using radioplex cream  BP 121/51 mmHg  Pulse 74  Temp(Src) 98.7 F (37.1 C)  Resp 18  Ht 5\' 2"  (1.575 m)  Wt 265 lb 1.6 oz (120.249 kg)  BMI 48.48 kg/m2  SpO2 95%

## 2015-04-29 NOTE — Progress Notes (Signed)
  Radiation Oncology         [phone] ________________________________  Name: Donna Shaffer MRN: [id number]  Date: 04/29/2015  DOB: 1946/02/15  Weekly Radiation Therapy Management    ICD-9-CM ICD-10-CM   1. Breast cancer of upper-outer quadrant of left female breast ([facility]) 174.4 C50.412      Current Dose: 27 Gy     Planned Dose:  60.4 Gy  Narrative . . . . . . . . The patient presents for routine under treatment assessment.                                   The patient is without complaint.  pt states fatigue but finds relief with naps and resting on the weekends, pt states she is eating well, states some itching and discomfort around the nipple, redness noted to left breast area but no peeling, using radioplex cream                                 Set-up films were reviewed.                                 The chart was checked. Physical Findings. . .  height is 5\' 2"  (1.575 m) and weight is 265 lb 1.6 oz (120.249 kg). Her temperature is 98.7 F (37.1 C). Her blood pressure is 121/51 and her pulse is 74. Her respiration is 18 and oxygen saturation is 95%. . Lungs are clear. The heart has a regular rhythm and rate. The left breast area shows some mild erythema. Impression . . . . . . . The patient is tolerating radiation. Plan . . . . . . . . . . . . Continue treatment as planned.  ________________________________   Blair Promise, PhD, MD

## 2015-04-30 ENCOUNTER — Ambulatory Visit
Admission: RE | Admit: 2015-04-30 | Discharge: 2015-04-30 | Disposition: A | Discharge status: Home / Self Care | Payer: Medicare Other | Source: Ambulatory Visit | Attending: Radiation Oncology | Admitting: Radiation Oncology

## 2015-04-30 DIAGNOSIS — Z9012 Acquired absence of left breast and nipple: Secondary | ICD-10-CM | POA: Diagnosis not present

## 2015-04-30 DIAGNOSIS — Z808 Family history of malignant neoplasm of other organs or systems: Secondary | ICD-10-CM | POA: Diagnosis not present

## 2015-04-30 DIAGNOSIS — C50412 Malignant neoplasm of upper-outer quadrant of left female breast: Secondary | ICD-10-CM | POA: Diagnosis not present

## 2015-04-30 DIAGNOSIS — Z170 Estrogen receptor positive status [ER+]: Secondary | ICD-10-CM | POA: Diagnosis not present

## 2015-04-30 DIAGNOSIS — Z510 Encounter for antineoplastic radiation therapy: Secondary | ICD-10-CM | POA: Diagnosis not present

## 2015-05-01 ENCOUNTER — Ambulatory Visit
Admission: RE | Admit: 2015-05-01 | Discharge: 2015-05-01 | Disposition: A | Discharge status: Home / Self Care | Payer: Medicare Other | Source: Ambulatory Visit | Attending: Radiation Oncology | Admitting: Radiation Oncology

## 2015-05-01 DIAGNOSIS — Z170 Estrogen receptor positive status [ER+]: Secondary | ICD-10-CM | POA: Diagnosis not present

## 2015-05-01 DIAGNOSIS — Z808 Family history of malignant neoplasm of other organs or systems: Secondary | ICD-10-CM | POA: Diagnosis not present

## 2015-05-01 DIAGNOSIS — Z510 Encounter for antineoplastic radiation therapy: Secondary | ICD-10-CM | POA: Diagnosis not present

## 2015-05-01 DIAGNOSIS — Z9012 Acquired absence of left breast and nipple: Secondary | ICD-10-CM | POA: Diagnosis not present

## 2015-05-01 DIAGNOSIS — C50412 Malignant neoplasm of upper-outer quadrant of left female breast: Secondary | ICD-10-CM | POA: Diagnosis not present

## 2015-05-02 ENCOUNTER — Ambulatory Visit
Admission: RE | Admit: 2015-05-02 | Discharge: 2015-05-02 | Disposition: A | Discharge status: Home / Self Care | Payer: Medicare Other | Source: Ambulatory Visit | Attending: Radiation Oncology | Admitting: Radiation Oncology

## 2015-05-02 DIAGNOSIS — Z510 Encounter for antineoplastic radiation therapy: Secondary | ICD-10-CM | POA: Diagnosis not present

## 2015-05-02 DIAGNOSIS — Z170 Estrogen receptor positive status [ER+]: Secondary | ICD-10-CM | POA: Diagnosis not present

## 2015-05-02 DIAGNOSIS — C50412 Malignant neoplasm of upper-outer quadrant of left female breast: Secondary | ICD-10-CM | POA: Diagnosis not present

## 2015-05-02 DIAGNOSIS — Z9012 Acquired absence of left breast and nipple: Secondary | ICD-10-CM | POA: Diagnosis not present

## 2015-05-02 DIAGNOSIS — Z808 Family history of malignant neoplasm of other organs or systems: Secondary | ICD-10-CM | POA: Diagnosis not present

## 2015-05-05 ENCOUNTER — Ambulatory Visit
Admission: RE | Admit: 2015-05-05 | Discharge: 2015-05-05 | Disposition: A | Discharge status: Home / Self Care | Payer: Medicare Other | Source: Ambulatory Visit | Attending: Radiation Oncology | Admitting: Radiation Oncology

## 2015-05-05 DIAGNOSIS — C50412 Malignant neoplasm of upper-outer quadrant of left female breast: Secondary | ICD-10-CM | POA: Diagnosis not present

## 2015-05-05 DIAGNOSIS — Z9012 Acquired absence of left breast and nipple: Secondary | ICD-10-CM | POA: Diagnosis not present

## 2015-05-05 DIAGNOSIS — Z510 Encounter for antineoplastic radiation therapy: Secondary | ICD-10-CM | POA: Diagnosis not present

## 2015-05-05 DIAGNOSIS — Z170 Estrogen receptor positive status [ER+]: Secondary | ICD-10-CM | POA: Diagnosis not present

## 2015-05-05 DIAGNOSIS — Z808 Family history of malignant neoplasm of other organs or systems: Secondary | ICD-10-CM | POA: Diagnosis not present

## 2015-05-06 ENCOUNTER — Ambulatory Visit
Admission: RE | Admit: 2015-05-06 | Discharge: 2015-05-06 | Disposition: A | Discharge status: Home / Self Care | Payer: Medicare Other | Source: Ambulatory Visit | Attending: Radiation Oncology | Admitting: Radiation Oncology

## 2015-05-06 ENCOUNTER — Encounter: Payer: Self-pay | Admitting: Radiation Oncology

## 2015-05-06 VITALS — BP 123/56 | HR 70 | Temp 98.2°F | Resp 16 | Ht 62.0 in | Wt 264.4 lb

## 2015-05-06 DIAGNOSIS — C50412 Malignant neoplasm of upper-outer quadrant of left female breast: Secondary | ICD-10-CM | POA: Diagnosis not present

## 2015-05-06 DIAGNOSIS — Z9012 Acquired absence of left breast and nipple: Secondary | ICD-10-CM | POA: Diagnosis not present

## 2015-05-06 DIAGNOSIS — Z170 Estrogen receptor positive status [ER+]: Secondary | ICD-10-CM | POA: Diagnosis not present

## 2015-05-06 DIAGNOSIS — Z808 Family history of malignant neoplasm of other organs or systems: Secondary | ICD-10-CM | POA: Diagnosis not present

## 2015-05-06 DIAGNOSIS — Z510 Encounter for antineoplastic radiation therapy: Secondary | ICD-10-CM | POA: Diagnosis not present

## 2015-05-06 MED ORDER — SONAFINE EX EMUL
1.0000 "application " | Freq: Once | CUTANEOUS | Status: AC
  Administered 2015-05-06: 1 via TOPICAL
  Filled 2015-05-06: qty 45

## 2015-05-06 MED ORDER — HYDROCODONE-ACETAMINOPHEN 5-325 MG PO TABS: 1.0000 | ORAL_TABLET | Freq: Four times a day (QID) | ORAL | Status: DC | PRN

## 2015-05-06 NOTE — Progress Notes (Signed)
  Radiation Oncology         (336) [phone] ________________________________  Name: Donna Shaffer MRN: [id number]  Date: 05/06/2015  DOB: 10-28-1945  Weekly Radiation Therapy Management    ICD-9-CM ICD-10-CM   1. Breast cancer of upper-outer quadrant of left female breast ([facility]) 174.4 C50.412      Current Dose: 36 Gy     Planned Dose:  60.4 Gy  Narrative . . . . . . . . The patient presents for routine under treatment assessment.                                   She reports having pain and itching in her left breast. She reports that it is keeping her awake at night. The skin on her left breast is red. She is using radiaplex. She has been given sonafine to try for the itching. She reports that she has fatigue that comes and goes.                                 Set-up films were reviewed.                                 The chart was checked. Physical Findings. . .  height is 5\' 2"  (1.575 m) and weight is 264 lb 6.4 oz (119.931 kg). Her oral temperature is 98.2 F (36.8 C). Her blood pressure is 123/56 and her pulse is 70. Her respiration is 16. . Weight essentially stable.  No significant changes. The lungs are clear. The heart has regular rhythm and rate. 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 No moist desquamation Impression . . . . . . . The patient is tolerating radiation. Plan . . . . . . . . . . . . Continue treatment as planned. Recommended oral Benadryl for the patient's itching at night. I've also given her prescription for Vicodin for her pain issues.  ________________________________   Blair Promise, PhD, MD

## 2015-05-06 NOTE — Progress Notes (Signed)
Donna Shaffer has completed 20 fractions to her left breast.  She reports having pain and itching in her left breast.  She reports that it is keeping her awake at night.  The skin on her left breast is red.  She is using radiaplex.  She has been given sonafine to try for the itching.  She reports that she has fatigue that comes and goes.  BP 123/56 mmHg  Pulse 70  Temp(Src) 98.2 F (36.8 C) (Oral)  Resp 16  Ht 5\' 2"  (1.575 m)  Wt 264 lb 6.4 oz (119.931 kg)  BMI 48.35 kg/m2

## 2015-05-06 NOTE — Addendum Note (Signed)
Encounter addended by: Arbutus Ped, [place] on: 05/06/2015  2:51 PM<BR>     Documentation filed: Rx Order Verification

## 2015-05-06 NOTE — Addendum Note (Signed)
Encounter addended by: Jacqulyn Liner, RN on: 05/06/2015  2:49 PM<BR>     Documentation filed: Inpatient MAR

## 2015-05-06 NOTE — Addendum Note (Signed)
Encounter addended by: Jacqulyn Liner, RN on: 05/06/2015  2:48 PM<BR>     Documentation filed: Orders, Dx Association, Medications

## 2015-05-07 ENCOUNTER — Ambulatory Visit
Admission: RE | Admit: 2015-05-07 | Discharge: 2015-05-07 | Disposition: A | Discharge status: Home / Self Care | Payer: Medicare Other | Source: Ambulatory Visit | Attending: Radiation Oncology | Admitting: Radiation Oncology

## 2015-05-07 DIAGNOSIS — Z510 Encounter for antineoplastic radiation therapy: Secondary | ICD-10-CM | POA: Diagnosis not present

## 2015-05-07 DIAGNOSIS — Z170 Estrogen receptor positive status [ER+]: Secondary | ICD-10-CM | POA: Diagnosis not present

## 2015-05-07 DIAGNOSIS — C50412 Malignant neoplasm of upper-outer quadrant of left female breast: Secondary | ICD-10-CM | POA: Diagnosis not present

## 2015-05-07 DIAGNOSIS — Z808 Family history of malignant neoplasm of other organs or systems: Secondary | ICD-10-CM | POA: Diagnosis not present

## 2015-05-07 DIAGNOSIS — Z9012 Acquired absence of left breast and nipple: Secondary | ICD-10-CM | POA: Diagnosis not present

## 2015-05-08 ENCOUNTER — Ambulatory Visit
Admission: RE | Admit: 2015-05-08 | Discharge: 2015-05-08 | Disposition: A | Discharge status: Home / Self Care | Payer: Medicare Other | Source: Ambulatory Visit | Attending: Radiation Oncology | Admitting: Radiation Oncology

## 2015-05-08 DIAGNOSIS — Z9012 Acquired absence of left breast and nipple: Secondary | ICD-10-CM | POA: Diagnosis not present

## 2015-05-08 DIAGNOSIS — C50412 Malignant neoplasm of upper-outer quadrant of left female breast: Secondary | ICD-10-CM | POA: Diagnosis not present

## 2015-05-08 DIAGNOSIS — Z170 Estrogen receptor positive status [ER+]: Secondary | ICD-10-CM | POA: Diagnosis not present

## 2015-05-08 DIAGNOSIS — Z510 Encounter for antineoplastic radiation therapy: Secondary | ICD-10-CM | POA: Diagnosis not present

## 2015-05-08 DIAGNOSIS — Z808 Family history of malignant neoplasm of other organs or systems: Secondary | ICD-10-CM | POA: Diagnosis not present

## 2015-05-09 ENCOUNTER — Ambulatory Visit
Admission: RE | Admit: 2015-05-09 | Discharge: 2015-05-09 | Disposition: A | Discharge status: Home / Self Care | Payer: Medicare Other | Source: Ambulatory Visit | Attending: Radiation Oncology | Admitting: Radiation Oncology

## 2015-05-09 DIAGNOSIS — Z808 Family history of malignant neoplasm of other organs or systems: Secondary | ICD-10-CM | POA: Diagnosis not present

## 2015-05-09 DIAGNOSIS — Z9012 Acquired absence of left breast and nipple: Secondary | ICD-10-CM | POA: Diagnosis not present

## 2015-05-09 DIAGNOSIS — Z510 Encounter for antineoplastic radiation therapy: Secondary | ICD-10-CM | POA: Diagnosis not present

## 2015-05-09 DIAGNOSIS — Z170 Estrogen receptor positive status [ER+]: Secondary | ICD-10-CM | POA: Diagnosis not present

## 2015-05-09 DIAGNOSIS — C50412 Malignant neoplasm of upper-outer quadrant of left female breast: Secondary | ICD-10-CM | POA: Diagnosis not present

## 2015-05-12 ENCOUNTER — Ambulatory Visit
Admission: RE | Admit: 2015-05-12 | Discharge: 2015-05-12 | Disposition: A | Discharge status: Home / Self Care | Payer: Medicare Other | Source: Ambulatory Visit | Attending: Radiation Oncology | Admitting: Radiation Oncology

## 2015-05-12 DIAGNOSIS — Z808 Family history of malignant neoplasm of other organs or systems: Secondary | ICD-10-CM | POA: Diagnosis not present

## 2015-05-12 DIAGNOSIS — Z9012 Acquired absence of left breast and nipple: Secondary | ICD-10-CM | POA: Diagnosis not present

## 2015-05-12 DIAGNOSIS — Z510 Encounter for antineoplastic radiation therapy: Secondary | ICD-10-CM | POA: Diagnosis not present

## 2015-05-12 DIAGNOSIS — C50412 Malignant neoplasm of upper-outer quadrant of left female breast: Secondary | ICD-10-CM | POA: Diagnosis not present

## 2015-05-12 DIAGNOSIS — Z170 Estrogen receptor positive status [ER+]: Secondary | ICD-10-CM | POA: Diagnosis not present

## 2015-05-13 ENCOUNTER — Ambulatory Visit
Admission: RE | Admit: 2015-05-13 | Discharge: 2015-05-13 | Disposition: A | Discharge status: Home / Self Care | Payer: Medicare Other | Source: Ambulatory Visit | Attending: Radiation Oncology | Admitting: Radiation Oncology

## 2015-05-13 ENCOUNTER — Encounter: Payer: Self-pay | Admitting: Radiation Oncology

## 2015-05-13 VITALS — BP 125/52 | HR 69 | Temp 98.2°F | Ht 62.0 in | Wt 264.5 lb

## 2015-05-13 DIAGNOSIS — Z923 Personal history of irradiation: Secondary | ICD-10-CM | POA: Insufficient documentation

## 2015-05-13 DIAGNOSIS — Z9012 Acquired absence of left breast and nipple: Secondary | ICD-10-CM | POA: Diagnosis not present

## 2015-05-13 DIAGNOSIS — C50412 Malignant neoplasm of upper-outer quadrant of left female breast: Secondary | ICD-10-CM | POA: Diagnosis not present

## 2015-05-13 DIAGNOSIS — Z170 Estrogen receptor positive status [ER+]: Secondary | ICD-10-CM | POA: Diagnosis not present

## 2015-05-13 DIAGNOSIS — N644 Mastodynia: Secondary | ICD-10-CM | POA: Insufficient documentation

## 2015-05-13 DIAGNOSIS — Z510 Encounter for antineoplastic radiation therapy: Secondary | ICD-10-CM | POA: Diagnosis not present

## 2015-05-13 DIAGNOSIS — Z808 Family history of malignant neoplasm of other organs or systems: Secondary | ICD-10-CM | POA: Diagnosis not present

## 2015-05-13 NOTE — Progress Notes (Signed)
  Radiation Oncology         (336) [phone] ________________________________  Name: Donna Shaffer MRN: [id number]  Date: 05/13/2015  DOB: [birth year]  Weekly Radiation Therapy Management    ICD-9-CM ICD-10-CM   1. Breast cancer of upper-outer quadrant of left female breast ([facility]) 174.4 C50.412      Current Dose: 45 Gy     Planned Dose:  60.4 Gy  Narrative . . . . . . . . The patient presents for routine under treatment assessment. Donna Shaffer has completed 25 fractions ot her left breast. She reports having burning pain in her left breast that she is rating as a 7/10. She is taking norco at night. She reports having slight fatigue. She is using sonafine, radiaplex, and neosporin.                                 Set-up films were reviewed.                                 The chart was checked. Physical Findings. . .  height is 5\' 2"  (1.575 m) and weight is 264 lb 8 oz (119.976 kg). Her oral temperature is 98.2 F (36.8 C). Her blood pressure is 125/52 and her pulse is 69. . Weight essentially stable.  No significant changes. The lungs are clear. The heart has regular rhythm and rate. The left breast area shows erythema and hyperpigmentation changes. Small amount of skin breakdown in the inframammary fold. Impression . . . . . . . The patient is tolerating radiation. Plan . . . . . . . . . . . . Continue treatment as planned.She was given hydrogel pads to help with her symptoms. ________________________________   Blair Promise, PhD, MD  This document serves as a record of services personally performed by Gery Pray, MD. It was created on his behalf by Darcus Austin, a trained medical scribe. The creation of this record is based on the scribe's personal observations and the provider's statements to them. This document has been checked and approved by the attending provider.

## 2015-05-13 NOTE — Progress Notes (Signed)
Donna Shaffer has completed 25 fractions ot her left breast.  She reports having burning pain in her left breast that she is rating at a 7/10.  She is taking norco at night.  She reports having slight fatigue.  She is using sonafine, radiaplex and neosporin.  The skin on her left breast is red with desquamation under the breast.  She has been given hydrogel pads to try.  BP 125/52 mmHg  Pulse 69  Temp(Src) 98.2 F (36.8 C) (Oral)  Ht 5\' 2"  (1.575 m)  Wt 264 lb 8 oz (119.976 kg)  BMI 48.37 kg/m2

## 2015-05-14 ENCOUNTER — Ambulatory Visit
Admission: RE | Admit: 2015-05-14 | Discharge: 2015-05-14 | Disposition: A | Discharge status: Home / Self Care | Payer: Medicare Other | Source: Ambulatory Visit | Attending: Radiation Oncology | Admitting: Radiation Oncology

## 2015-05-14 DIAGNOSIS — Z510 Encounter for antineoplastic radiation therapy: Secondary | ICD-10-CM | POA: Diagnosis not present

## 2015-05-14 DIAGNOSIS — Z170 Estrogen receptor positive status [ER+]: Secondary | ICD-10-CM | POA: Diagnosis not present

## 2015-05-14 DIAGNOSIS — Z808 Family history of malignant neoplasm of other organs or systems: Secondary | ICD-10-CM | POA: Diagnosis not present

## 2015-05-14 DIAGNOSIS — Z9012 Acquired absence of left breast and nipple: Secondary | ICD-10-CM | POA: Diagnosis not present

## 2015-05-14 DIAGNOSIS — C50412 Malignant neoplasm of upper-outer quadrant of left female breast: Secondary | ICD-10-CM | POA: Diagnosis not present

## 2015-05-15 ENCOUNTER — Ambulatory Visit
Admission: RE | Admit: 2015-05-15 | Discharge: 2015-05-15 | Disposition: A | Discharge status: Home / Self Care | Payer: Medicare Other | Source: Ambulatory Visit | Attending: Radiation Oncology | Admitting: Radiation Oncology

## 2015-05-15 ENCOUNTER — Encounter: Payer: Self-pay | Admitting: Radiation Oncology

## 2015-05-15 DIAGNOSIS — Z808 Family history of malignant neoplasm of other organs or systems: Secondary | ICD-10-CM | POA: Diagnosis not present

## 2015-05-15 DIAGNOSIS — Z170 Estrogen receptor positive status [ER+]: Secondary | ICD-10-CM | POA: Diagnosis not present

## 2015-05-15 DIAGNOSIS — C50412 Malignant neoplasm of upper-outer quadrant of left female breast: Secondary | ICD-10-CM | POA: Diagnosis not present

## 2015-05-15 DIAGNOSIS — Z9012 Acquired absence of left breast and nipple: Secondary | ICD-10-CM | POA: Diagnosis not present

## 2015-05-15 DIAGNOSIS — Z510 Encounter for antineoplastic radiation therapy: Secondary | ICD-10-CM | POA: Diagnosis not present

## 2015-05-15 NOTE — Progress Notes (Signed)
  Radiation Oncology         [phone] ________________________________  Name: Donna Shaffer MRN: [id number]  Date: 05/15/2015  DOB: 01/12/1946  Simulation  Note   NARRATIVE: Earlier today the patient underwent additional planning for radiation therapy directed at the left breast area. The patient had set up of a custom photon boost using a 2 field arrangement. The boost was directed at the patient's lumpectomy cavity. A computerized isodose plan will be generated for treatment.  Patient will receive 5 additional treatments at 2 gray per fraction for a boost dose of 10 gray and a cumulative dose of 60.4 gray. -----------------------------------  Blair Promise, PhD, MD

## 2015-05-16 ENCOUNTER — Ambulatory Visit
Admission: RE | Admit: 2015-05-16 | Discharge: 2015-05-16 | Disposition: A | Discharge status: Home / Self Care | Payer: Medicare Other | Source: Ambulatory Visit | Attending: Radiation Oncology | Admitting: Radiation Oncology

## 2015-05-16 DIAGNOSIS — Z9012 Acquired absence of left breast and nipple: Secondary | ICD-10-CM | POA: Diagnosis not present

## 2015-05-16 DIAGNOSIS — Z170 Estrogen receptor positive status [ER+]: Secondary | ICD-10-CM | POA: Diagnosis not present

## 2015-05-16 DIAGNOSIS — Z808 Family history of malignant neoplasm of other organs or systems: Secondary | ICD-10-CM | POA: Diagnosis not present

## 2015-05-16 DIAGNOSIS — Z510 Encounter for antineoplastic radiation therapy: Secondary | ICD-10-CM | POA: Diagnosis not present

## 2015-05-16 DIAGNOSIS — C50412 Malignant neoplasm of upper-outer quadrant of left female breast: Secondary | ICD-10-CM | POA: Diagnosis not present

## 2015-05-19 ENCOUNTER — Ambulatory Visit
Admission: RE | Admit: 2015-05-19 | Discharge: 2015-05-19 | Disposition: A | Discharge status: Home / Self Care | Payer: Medicare Other | Source: Ambulatory Visit | Attending: Radiation Oncology | Admitting: Radiation Oncology

## 2015-05-19 DIAGNOSIS — C50412 Malignant neoplasm of upper-outer quadrant of left female breast: Secondary | ICD-10-CM | POA: Diagnosis not present

## 2015-05-19 DIAGNOSIS — Z808 Family history of malignant neoplasm of other organs or systems: Secondary | ICD-10-CM | POA: Diagnosis not present

## 2015-05-19 DIAGNOSIS — Z510 Encounter for antineoplastic radiation therapy: Secondary | ICD-10-CM | POA: Diagnosis not present

## 2015-05-19 DIAGNOSIS — Z9012 Acquired absence of left breast and nipple: Secondary | ICD-10-CM | POA: Diagnosis not present

## 2015-05-19 DIAGNOSIS — Z170 Estrogen receptor positive status [ER+]: Secondary | ICD-10-CM | POA: Diagnosis not present

## 2015-05-20 ENCOUNTER — Encounter: Payer: Self-pay | Admitting: Radiation Oncology

## 2015-05-20 ENCOUNTER — Ambulatory Visit
Admission: RE | Admit: 2015-05-20 | Discharge: 2015-05-20 | Disposition: A | Discharge status: Home / Self Care | Payer: Medicare Other | Source: Ambulatory Visit | Attending: Radiation Oncology | Admitting: Radiation Oncology

## 2015-05-20 VITALS — BP 138/50 | HR 70 | Temp 98.0°F | Resp 16 | Ht 62.0 in | Wt 265.7 lb

## 2015-05-20 DIAGNOSIS — Z510 Encounter for antineoplastic radiation therapy: Secondary | ICD-10-CM | POA: Diagnosis not present

## 2015-05-20 DIAGNOSIS — C50412 Malignant neoplasm of upper-outer quadrant of left female breast: Secondary | ICD-10-CM | POA: Insufficient documentation

## 2015-05-20 DIAGNOSIS — Z170 Estrogen receptor positive status [ER+]: Secondary | ICD-10-CM | POA: Diagnosis not present

## 2015-05-20 DIAGNOSIS — Z808 Family history of malignant neoplasm of other organs or systems: Secondary | ICD-10-CM | POA: Diagnosis not present

## 2015-05-20 DIAGNOSIS — Z9012 Acquired absence of left breast and nipple: Secondary | ICD-10-CM | POA: Diagnosis not present

## 2015-05-20 DIAGNOSIS — Z923 Personal history of irradiation: Secondary | ICD-10-CM | POA: Insufficient documentation

## 2015-05-20 MED ORDER — SONAFINE EX EMUL
1.0000 "application " | Freq: Two times a day (BID) | CUTANEOUS | Status: DC
  Administered 2015-05-20: 1 via TOPICAL
  Filled 2015-05-20: qty 45

## 2015-05-20 MED ORDER — ALRA NON-METALLIC DEODORANT (RAD-ONC)
1.0000 | Freq: Once | TOPICAL | Status: AC
Start: 2015-05-20 — End: 2015-05-20
  Administered 2015-05-20: 1 via TOPICAL

## 2015-05-20 MED ORDER — RADIAPLEXRX EX GEL
Freq: Once | CUTANEOUS | Status: AC
  Administered 2015-05-20: 15:00:00 via TOPICAL

## 2015-05-20 MED ORDER — OXYCODONE-ACETAMINOPHEN 5-325 MG PO TABS: 1.0000 | ORAL_TABLET | Freq: Four times a day (QID) | ORAL | Status: DC | PRN

## 2015-05-20 NOTE — Progress Notes (Signed)
Donna Shaffer has completed 30 fractions to her left breast.  She reports pain at a 9/10 in her left breast.  She is taking norco 2 tablets q 6 hours and says it is not helping as much.  She does need a refill.  She is starting to have trouble sleeping again.  She is using radiaplex and confine.  She reports her breast is draining now.  The skin on her breast is red and warm to the touch.  She has an area of dry desquamation under her left arm.  She has 2 large area of moist desquamation under her breast.  She has been given triple antibiotic samples to apply to the desquamated areas.  BP 138/50 mmHg  Pulse 70  Temp(Src) 98 F (36.7 C) (Oral)  Resp 16  Ht 5\' 2"  (1.575 m)  Wt 265 lb 11.2 oz (120.521 kg)  BMI 48.58 kg/m2

## 2015-05-20 NOTE — Progress Notes (Signed)
  Radiation Oncology         [phone]) [phone] ________________________________  Name: Donna Shaffer MRN: [id number]  Date: 05/20/2015  DOB: 09/19/1945  Weekly Radiation Therapy Management    ICD-9-CM ICD-10-CM   1. Breast cancer of upper-outer quadrant of left female breast (HCC) 174.4 C50.412 hyaluronate sodium (RADIAPLEXRX) gel     SONAFINE emulsion 1 application     non-metallic deodorant (ALRA) 1 application     Current Dose: 54.4 Gy     Planned Dose:  60.4 Gy  Narrative . . . . . . . . The patient presents for routine under treatment assessment.                                  Donna Shaffer has completed 30 fractions to her left breast. She reports pain at a 9/10 in her left breast. She is taking norco 2 tablets q 6 hours and says it is not helping as much. She does need a refill. She is starting to have trouble sleeping again. She is using radiaplex and confine. She reports her breast is draining now.   Set-up films were reviewed.                                 The chart was checked. Physical Findings. . .  height is 5\' 2"  (1.575 m) and weight is 265 lb 11.2 oz (120.521 kg). Her oral temperature is 98 F (36.7 C). Her blood pressure is 138/50 and her pulse is 70. Her respiration is 16. . Weight essentially stable.  No significant changes. The skin on her breast is red and warm to the touch. She has an area of dry desquamation under her left arm. She has 2 large area of moist desquamation under her breast.  Impression . . . . . . . The patient is tolerating radiation. Plan . . . . . . . . . . . . Continue treatment as planned. Percocet for pain. The patient is also be given triple antibiotic ointment samples and was also had placement of  to the inframammary fold  Applied gentian violet to desquamated areas under patient's left breast. Let area dry and covered area with non-adherant telfa pad. Gave patient additional telfa pads to use at home.  Percocet for pain  control.  ________________________________   Donna Promise, PhD, MD

## 2015-05-20 NOTE — Progress Notes (Addendum)
Applied gentian violet to desquamated areas under patient's left breast.  Let area dry and covered area with non-adherant telfa pad.  Gave patient additional telfa pads to use at home.

## 2015-05-21 ENCOUNTER — Ambulatory Visit (HOSPITAL_BASED_OUTPATIENT_CLINIC_OR_DEPARTMENT_OTHER): Payer: Medicare Other | Admitting: Oncology

## 2015-05-21 ENCOUNTER — Telehealth: Payer: Self-pay | Admitting: Oncology

## 2015-05-21 ENCOUNTER — Ambulatory Visit
Admission: RE | Admit: 2015-05-21 | Discharge: 2015-05-21 | Disposition: A | Discharge status: Home / Self Care | Payer: Medicare Other | Source: Ambulatory Visit | Attending: Radiation Oncology | Admitting: Radiation Oncology

## 2015-05-21 ENCOUNTER — Other Ambulatory Visit (HOSPITAL_BASED_OUTPATIENT_CLINIC_OR_DEPARTMENT_OTHER): Payer: Medicare Other

## 2015-05-21 VITALS — BP 124/51 | HR 73 | Temp 97.6°F | Resp 18 | Ht 62.0 in | Wt 266.1 lb

## 2015-05-21 DIAGNOSIS — Z9012 Acquired absence of left breast and nipple: Secondary | ICD-10-CM | POA: Diagnosis not present

## 2015-05-21 DIAGNOSIS — C50412 Malignant neoplasm of upper-outer quadrant of left female breast: Secondary | ICD-10-CM

## 2015-05-21 DIAGNOSIS — Z808 Family history of malignant neoplasm of other organs or systems: Secondary | ICD-10-CM | POA: Diagnosis not present

## 2015-05-21 DIAGNOSIS — Z510 Encounter for antineoplastic radiation therapy: Secondary | ICD-10-CM | POA: Diagnosis not present

## 2015-05-21 DIAGNOSIS — Z170 Estrogen receptor positive status [ER+]: Secondary | ICD-10-CM | POA: Diagnosis not present

## 2015-05-21 LAB — CBC WITH DIFFERENTIAL/PLATELET
BASO%: 0.8 % (ref 0.0–2.0)
Basophils Absolute: 0.1 10*3/uL (ref 0.0–0.1)
EOS%: 4.3 % (ref 0.0–7.0)
Eosinophils Absolute: 0.4 10*3/uL (ref 0.0–0.5)
HCT: 44.3 % (ref 34.8–46.6)
HGB: 14.8 g/dL (ref 11.6–15.9)
LYMPH%: 15.1 % (ref 14.0–49.7)
MCH: 30.6 pg (ref 25.1–34.0)
MCHC: 33.5 g/dL (ref 31.5–36.0)
MCV: 91.3 fL (ref 79.5–101.0)
MONO#: 1.1 10*3/uL — ABNORMAL HIGH (ref 0.1–0.9)
MONO%: 11.8 % (ref 0.0–14.0)
NEUT#: 6.1 10*3/uL (ref 1.5–6.5)
NEUT%: 68 % (ref 38.4–76.8)
Platelets: 213 10*3/uL (ref 145–400)
RBC: 4.85 10*6/uL (ref 3.70–5.45)
RDW: 13.8 % (ref 11.2–14.5)
WBC: 9 10*3/uL (ref 3.9–10.3)
lymph#: 1.4 10*3/uL (ref 0.9–3.3)

## 2015-05-21 LAB — COMPREHENSIVE METABOLIC PANEL
ALT: 37 U/L (ref 0–55)
AST: 33 U/L (ref 5–34)
Albumin: 3.5 g/dL (ref 3.5–5.0)
Alkaline Phosphatase: 94 U/L (ref 40–150)
Anion Gap: 9 mEq/L (ref 3–11)
BUN: 18.2 mg/dL (ref 7.0–26.0)
CO2: 29 mEq/L (ref 22–29)
Calcium: 9 mg/dL (ref 8.4–10.4)
Chloride: 102 mEq/L (ref 98–109)
Creatinine: 0.7 mg/dL (ref 0.6–1.1)
EGFR: 86 mL/min/{1.73_m2} — ABNORMAL LOW (ref >90)
Glucose: 111 mg/dl (ref 70–140)
Potassium: 3.5 mEq/L (ref 3.5–5.1)
Sodium: 140 mEq/L (ref 136–145)
Total Bilirubin: 0.6 mg/dL (ref 0.20–1.20)
Total Protein: 6.9 g/dL (ref 6.4–8.3)

## 2015-05-21 MED ORDER — FLUCONAZOLE 100 MG PO TABS: 100.0000 mg | ORAL_TABLET | Freq: Every day | ORAL | Status: DC

## 2015-05-21 MED ORDER — ANASTROZOLE 1 MG PO TABS: 1.0000 mg | ORAL_TABLET | Freq: Every day | ORAL | Status: DC

## 2015-05-21 NOTE — Progress Notes (Signed)
[facility]  [phone] [phone]     ID: Donna Shaffer DOB: 04-21-1945  MR#: [id number]  QIH#:[id number]  Patient Care Team: Myrlene Broker, MD as PCP - General (Family Medicine) Chauncey Cruel, MD as Consulting Physician (Oncology) Autumn Messing III, MD as Consulting Physician (General Surgery) Kyra Leyland, MD as Consulting Physician (Unknown Physician Specialty) Armandina Stammer, MD (Obstetrics and Gynecology) Yvone Neu, MD as Referring Physician (Family Medicine) Marchia Bond, MD as Consulting Physician (Orthopedic Surgery) PCP: Myrlene Broker, MD OTHER MD:  CHIEF COMPLAINT: Estrogen receptor positive breast cancer  CURRENT TREATMENT: Awaiting adjuvant radiation   BREAST CANCER HISTORY: "Donna Shaffer" [facility]  showing a possible mass in the left breast. Left diagnostic mammography with tomosynthesis and left breast ultrasonography 01/09/2015 found a spiculated mass in the upper outer quadrant of the left breast which on ultrasonography measured 0.68 cm. Ultrasound of the left axilla was benign.  On 01/15/2015 the patient underwent left upper outer quadrant breast biopsy, with the pathology ([id number]) showing an invasive ductal carcinoma, grade 1 or 2, estrogen receptor 100% positive, progesterone receptor 85% positive, both with strong staining intensity, with an MIB-1 of 5% and no HER-2 amplification, the signals ratio being 1.29 and the number per cell 2.52.  On 02/28/2015 the patient underwent radioactive seed guided left lumpectomy with axillary sentinel lymph node sampling. The final pathology from this procedure ([id number]) confirmed an invasive ductal carcinoma measuring 0.8 cm, grade 1, with ample margins, and both sentinel lymph nodes clear. Repeat HER-2 was again negative, with a signals ratio 1.07, the number per cell being 1.60.  The patient's  subsequent history is as detailed below   INTERVAL HISTORY: Donna Shaffer  Returns today for follow-up of her breast cancer. She Donna Shaffer complete her radiation treatments later this week. She is generally tolerating them well, although for the last few days she has had significant desquamation causing her pain. She is also more fatigued than usual.  REVIEW OF SYSTEMS:  she does not exercise regularly. She walks her dog most days, but only a little bed and she gets short of breath doing that. She feels off balance. This is not dizziness, there is "something wrong with my feet". Aside from this a detailed review of systems today was stable  PAST MEDICAL HISTORY: Past Medical History  Diagnosis Date  . Hypertension   . Anxiety   . Depression   . Cancer ([place])   . Colon polyps   . Breast cancer ([place])     left    PAST SURGICAL HISTORY: Past Surgical History  Procedure Laterality Date  . Cholecystectomy    . Appendectomy    . Abdominal hysterectomy    . Bilateral oophorectomy    . Radioactive seed guided mastectomy with axillary sentinel lymph node biopsy Left 02/28/2015    Procedure: RADIOACTIVE SEED GUIDED PARTIAL MASTECTOMY WITH AXILLARY SENTINEL LYMPH NODE BIOPSY;  Surgeon: Autumn Messing III, MD;  Location: [facility];  Service: General;  Laterality: Left;  . Cataract extraction Bilateral     FAMILY HISTORY Family History  Problem Relation Age of Onset  . Melanoma Mother   . Skin cancer Father    The patient's father died at the age of 72 from complications of COPD in the setting of tobacco abuse. The patient's mother died at the age of 74 from "failure to thrive". The patient had no brothers, one sister. There is no history  of breast or ovarian cancer in the family.  GYNECOLOGIC HISTORY:  No LMP recorded. Patient has had a hysterectomy. Menarche age 54, first live birth age 89, the patient is [place] P2. She had a hysterectomy at the age of 37 and took hormone replacement until her  84s. She subsequently underwent bilateral salpingo-oophorectomy.  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 She is single and lives alone with her 32-year-old dog. Daughter Donna Shaffer lives in [place]. She is disabled secondary to multiple orthopedic problems as well as a history of pulmonary embolism. Son Donna Shaffer lives in [place] where he works for [employer]. The patient has 4 grandchildren. She is not a church attender    ADVANCED DIRECTIVES: Not in place. The patient tells me she intends to name her daughter Donna Shaffer as her healthcare power of attorney. Donna Shaffer can be reached at [place]: Social History  Substance Use Topics  . Smoking status: Never Smoker   . Smokeless tobacco: Never Used  . Alcohol Use: No     Colonoscopy: October 2016/my's and high  PAP: Status post hysterectomy  Bone density: 2014/normal  Lipid panel:  Allergies  Allergen Reactions  . Sulfa Antibiotics Swelling    Current Outpatient Prescriptions  Medication Sig Dispense Refill  . amLODipine (NORVASC) 5 MG tablet Take 5 mg by mouth daily.    . Calcium Citrate-Vitamin D (CALCIUM + D PO) Take 600 mg by mouth.    . citalopram (CELEXA) 40 MG tablet Take 40 mg by mouth daily.    . hyaluronate sodium (RADIAPLEXRX) GEL Apply 1 application topically 2 (two) times daily.    Marland Kitchen HYDROcodone-acetaminophen (NORCO) 5-325 MG tablet Take 1 tablet by mouth every 6 (six) hours as needed for moderate pain. 30 tablet 0  . non-metallic deodorant (ALRA) MISC Apply 1 application topically daily as needed.    Marland Kitchen oxyCODONE-acetaminophen (PERCOCET/ROXICET) 5-325 MG tablet Take 1-2 tablets by mouth every 6 (six) hours as needed for severe pain. 60 tablet 0  . pantoprazole (PROTONIX) 40 MG tablet Take 40 mg by mouth daily.    Marland Kitchen PROAIR HFA 108 (90 Base) MCG/ACT inhaler INHALE 2 PUFFS Q 4 H PRN  3  .  valsartan-hydrochlorothiazide (DIOVAN-HCT) 320-12.5 MG tablet Take 1 tablet by mouth daily.    . Wound Dressings (SONAFINE EX) Apply topically.     No current facility-administered medications for this visit.    OBJECTIVE: Middle-aged white woman who appears stated age 43 Vitals:   05/21/15 1203  BP: 124/51  Pulse: 73  Temp: 97.6 F (36.4 C)  Resp: 18     Body mass index is 48.66 kg/(m^2).    ECOG FS:2 - Symptomatic, <50% confined to bed  Sclerae unicteric, pupils round and equal Oropharynx clear and moist-- no thrush or other lesions No cervical or supraclavicular adenopathy Lungs no rales or rhonchi Heart regular rate and rhythm Abd soft, obese, nontender, positive bowel sounds MSK no focal spinal tenderness, no upper extremity lymphedema Neuro: nonfocal, well oriented, anxious affect Breasts: the right breast is unremarkable. The left breast is erythematous and there is a significant area of desquamation in the inframammary fold. The left axilla is benign.   LAB RESULTS:  CMP     Component Value Date/Time   NA 140 03/11/2015 1609   NA 140 02/21/2015 1200   K 4.0 03/11/2015 1609   K 4.9 02/21/2015 1200  CL 100* 02/21/2015 1200   CO2 28 03/11/2015 1609   CO2 32 02/21/2015 1200   GLUCOSE 137 03/11/2015 1609   GLUCOSE 119* 02/21/2015 1200   BUN 16.0 03/11/2015 1609   BUN 15 02/21/2015 1200   CREATININE 0.8 03/11/2015 1609   CREATININE 0.92 02/21/2015 1200   CALCIUM 9.5 03/11/2015 1609   CALCIUM 9.4 02/21/2015 1200   PROT 7.2 03/11/2015 1609   ALBUMIN 3.6 03/11/2015 1609   AST 37* 03/11/2015 1609   ALT 34 03/11/2015 1609   ALKPHOS 112 03/11/2015 1609   BILITOT 0.44 03/11/2015 1609   GFRNONAA >60 02/21/2015 1200   GFRAA >60 02/21/2015 1200    INo results found for: SPEP, UPEP  Lab Results  Component Value Date   WBC 9.0 05/21/2015   NEUTROABS 6.1 05/21/2015   HGB 14.8 05/21/2015   HCT 44.3 05/21/2015   MCV 91.3 05/21/2015   PLT 213 05/21/2015       Chemistry      Component Value Date/Time   NA 140 03/11/2015 1609   NA 140 02/21/2015 1200   K 4.0 03/11/2015 1609   K 4.9 02/21/2015 1200   CL 100* 02/21/2015 1200   CO2 28 03/11/2015 1609   CO2 32 02/21/2015 1200   BUN 16.0 03/11/2015 1609   BUN 15 02/21/2015 1200   CREATININE 0.8 03/11/2015 1609   CREATININE 0.92 02/21/2015 1200      Component Value Date/Time   CALCIUM 9.5 03/11/2015 1609   CALCIUM 9.4 02/21/2015 1200   ALKPHOS 112 03/11/2015 1609   AST 37* 03/11/2015 1609   ALT 34 03/11/2015 1609   BILITOT 0.44 03/11/2015 1609       No results found for: LABCA2  No components found for: LABCA125  No results for input(s): INR in the last 168 hours.  Urinalysis No results found for: COLORURINE, APPEARANCEUR, LABSPEC, PHURINE, GLUCOSEU, HGBUR, BILIRUBINUR, KETONESUR, PROTEINUR, UROBILINOGEN, NITRITE, LEUKOCYTESUR  STUDIES: No results found.  ASSESSMENT: 70 y.o. [place], [place] woman s/p left upper outer quadrant lumpectomy 02/28/2015 for a pT1b pN0, stage IA  invasive ductal carcinoma, grade 1, estrogen and progesterone receptor positive, HER-2 not amplified, with an MIB-1 of 5%.   (1) adjuvant radiation to be completed 05/23/2015  (2) to start anastrozole 06/21/2015  PLAN: Donna Shaffer is completing local treatment this week. She Donna Shaffer need at least a month in my opinion to recover before we can start anastrozole with any hope of her being able to tolerated it. She expressed a great deal of anxiety regarding taking a new medication. Apparently while she was taking hormones in the past she had significant side effects.  She understands anastrozole is on anti-estrogen, the opposite of what she was taking before. We discussed the possible toxicities, side effects and complications in detail. We also discussed cost issues. I think it would be best for her not to start on until April 1 and that was my suggestion.  She has a vaginal wetness which she thinks is due to a yeast  infection. I am starting her on Diflucan 100 mg daily for 7 days. If that does not take care of the problem she Donna Shaffer let me know.  [name] is severely deconditioned. She Donna Shaffer benefit from participation in the [facility] program. I do not know if that is available in [place] where she lives. [facility]  Otherwise she Donna Shaffer return to see me in June. The plan at this point is to continue anastrozole for a total of 5  years.She knows to call for any problems that may develop before that visit.Marland Kitchen  Chauncey Cruel, MD   05/21/2015 12:09 PM Medical Oncology and Hematology [facility] [address] [place], [place] [address] Tel. [phone]    Fax. [phone]

## 2015-05-21 NOTE — Telephone Encounter (Signed)
Gave patient avs report and appointment for June.

## 2015-05-22 ENCOUNTER — Other Ambulatory Visit: Payer: Self-pay | Admitting: *Deleted

## 2015-05-22 ENCOUNTER — Encounter: Payer: Self-pay | Admitting: Radiation Oncology

## 2015-05-22 ENCOUNTER — Ambulatory Visit
Admission: RE | Admit: 2015-05-22 | Discharge: 2015-05-22 | Disposition: A | Discharge status: Home / Self Care | Payer: Medicare Other | Source: Ambulatory Visit | Attending: Radiation Oncology | Admitting: Radiation Oncology

## 2015-05-22 VITALS — BP 132/67 | HR 72 | Temp 98.0°F | Resp 16

## 2015-05-22 DIAGNOSIS — Z510 Encounter for antineoplastic radiation therapy: Secondary | ICD-10-CM | POA: Diagnosis not present

## 2015-05-22 DIAGNOSIS — Z9012 Acquired absence of left breast and nipple: Secondary | ICD-10-CM | POA: Diagnosis not present

## 2015-05-22 DIAGNOSIS — Z808 Family history of malignant neoplasm of other organs or systems: Secondary | ICD-10-CM | POA: Diagnosis not present

## 2015-05-22 DIAGNOSIS — C50412 Malignant neoplasm of upper-outer quadrant of left female breast: Secondary | ICD-10-CM

## 2015-05-22 DIAGNOSIS — Z170 Estrogen receptor positive status [ER+]: Secondary | ICD-10-CM | POA: Diagnosis not present

## 2015-05-22 LAB — VITAMIN D 25 HYDROXY (VIT D DEFICIENCY, FRACTURES): Vitamin D, 25-Hydroxy: 19.6 ng/mL — ABNORMAL LOW (ref 30.0–100.0)

## 2015-05-22 NOTE — Progress Notes (Signed)
  Radiation Oncology         [phone]) [phone] ________________________________  Name: Donna Shaffer MRN: [id number]  Date: 05/22/2015  DOB: 05-Sep-1945  Weekly Radiation Therapy Management    ICD-9-CM ICD-10-CM   1. 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 . . . . . . . The patient presents for routine under treatment assessment.    Patient will finish treatment tomorrow. She reports her left breast is draining and said that the gentian violet and telfa pads have helped. She has not tried Percocet yet and continues to take norco as needed. She reports using sonafine and radiaplex on the upper portion of her breast. She has also been given a follow up for 1 week.                                  Set-up films were reviewed.                                 The chart was checked. Physical Findings. . .  oral temperature is 98 F (36.7 C). Her blood pressure is 132/67 and her pulse is 72. Her respiration is 16. . Weight essentially stable.  No significant changes. Moist desquamation in the intramammary fold is improving Impression . . . . . . . The patient is tolerating radiation. Plan . . . . . . . . . . . . Continue treatment as planned. ________________________________   Blair Promise, PhD, MD  This document serves as a record of services personally performed by Gery Pray, MD. It was created on his behalf by Derek Mound, a trained medical scribe. The creation of this record is based on the scribe's personal observations and the provider's statements to them. This document has been checked and approved by the attending provider.

## 2015-05-22 NOTE — Progress Notes (Signed)
Patient will finish treatment tomorrow.  She reports her left breast is draining and said that the gentian violet and telfa pads have helped.  She has not tried Percocet yet and continues to take norco as needed.  She reports using sonafine and radiaplex on the upper portion of her breast.  The skin on her left breast is red.  The desquamated areas under her left breast appear to be drier today.  Gentian violet has been applied to the desquamated areas and the patient has been given supplies to use at home.  She has also been given a follow up for 1 week.  BP 132/67 mmHg  Pulse 72  Temp(Src) 98 F (36.7 C) (Oral)  Resp 16

## 2015-05-23 ENCOUNTER — Encounter: Payer: Self-pay | Admitting: Radiation Oncology

## 2015-05-23 ENCOUNTER — Ambulatory Visit
Admission: RE | Admit: 2015-05-23 | Discharge: 2015-05-23 | Disposition: A | Discharge status: Home / Self Care | Payer: Medicare Other | Source: Ambulatory Visit | Attending: Radiation Oncology | Admitting: Radiation Oncology

## 2015-05-23 ENCOUNTER — Telehealth: Payer: Self-pay | Admitting: *Deleted

## 2015-05-23 ENCOUNTER — Other Ambulatory Visit: Payer: Self-pay | Admitting: *Deleted

## 2015-05-23 DIAGNOSIS — Z170 Estrogen receptor positive status [ER+]: Secondary | ICD-10-CM | POA: Diagnosis not present

## 2015-05-23 DIAGNOSIS — C50412 Malignant neoplasm of upper-outer quadrant of left female breast: Secondary | ICD-10-CM | POA: Diagnosis not present

## 2015-05-23 DIAGNOSIS — Z808 Family history of malignant neoplasm of other organs or systems: Secondary | ICD-10-CM | POA: Diagnosis not present

## 2015-05-23 DIAGNOSIS — Z9012 Acquired absence of left breast and nipple: Secondary | ICD-10-CM | POA: Diagnosis not present

## 2015-05-23 DIAGNOSIS — Z510 Encounter for antineoplastic radiation therapy: Secondary | ICD-10-CM | POA: Diagnosis not present

## 2015-05-23 MED ORDER — FLUCONAZOLE 100 MG PO TABS: 100.0000 mg | ORAL_TABLET | Freq: Every day | ORAL | Status: DC

## 2015-05-23 NOTE — Telephone Encounter (Signed)
Left message for a return phone call to follow up after completion of XRT.

## 2015-05-26 ENCOUNTER — Other Ambulatory Visit: Payer: Self-pay | Admitting: *Deleted

## 2015-05-26 MED ORDER — FLUCONAZOLE 100 MG PO TABS: 100.0000 mg | ORAL_TABLET | Freq: Every day | ORAL | Status: DC

## 2015-05-30 ENCOUNTER — Other Ambulatory Visit: Payer: Self-pay | Admitting: *Deleted

## 2015-05-30 MED ORDER — ANASTROZOLE 1 MG PO TABS
1.0000 mg | ORAL_TABLET | Freq: Every day | ORAL | Status: DC
Start: 2015-05-30 — End: 2017-12-14

## 2015-06-01 NOTE — Progress Notes (Signed)
  Radiation Oncology         [phone] ________________________________  Name: Donna Shaffer MRN: [id number]  Date: 05/23/2015  DOB: 07/10/1945  End of Treatment Note  ICD-9-CM ICD-10-CM     1. Breast cancer of upper-outer quadrant of left female breast ([place]) 174.4 C50.412     DIAGNOSIS: Left upper outer quadrant breast cancer. 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     Indication for treatment:  Breast conservation therapy       Radiation treatment dates:   04/09/2015-05/23/2015  Site/dose:   Left breast 50.4 gray in 28 fractions, lumpectomy cavity boost 10 gray in 5 fractions  Beams/energy:   3-D conformal for initial treatment set up with tangential beams, reduced field photon beam boost for lumpectomy cavity  Narrative: The patient tolerated radiation treatment relatively well.   She did experience some moist desquamation towards the end of treatment which was treated with gentian violet, this seemed to help significantly.   Plan: The patient has completed radiation treatment. The patient will return to radiation oncology clinic for routine followup in one week. I advised them to call or return sooner if they have any questions or concerns related to their recovery or treatment.  She will be followed closely until her moist desquamation clears.  -----------------------------------  Blair Promise, PhD, MD

## 2015-06-04 ENCOUNTER — Other Ambulatory Visit: Payer: Self-pay | Admitting: Adult Health

## 2015-06-04 DIAGNOSIS — C50412 Malignant neoplasm of upper-outer quadrant of left female breast: Secondary | ICD-10-CM

## 2015-06-24 DIAGNOSIS — C50412 Malignant neoplasm of upper-outer quadrant of left female breast: Secondary | ICD-10-CM | POA: Diagnosis not present

## 2015-06-26 ENCOUNTER — Encounter: Payer: Self-pay | Admitting: Oncology

## 2015-06-26 NOTE — Progress Notes (Signed)
No asst for femara with no insurance.

## 2015-06-26 NOTE — Progress Notes (Signed)
Received forwarded email from SW that patient cannot afford medication.Called patient and left a voicemail for her to return my call. Will apply to [employer] for copay assistance when patient returns call to do application.

## 2015-06-30 ENCOUNTER — Encounter: Payer: Self-pay | Admitting: Oncology

## 2015-07-03 ENCOUNTER — Encounter: Payer: Self-pay | Admitting: Radiation Oncology

## 2015-07-03 ENCOUNTER — Ambulatory Visit
Admission: RE | Admit: 2015-07-03 | Discharge: 2015-07-03 | Disposition: A | Discharge status: Home / Self Care | Payer: Medicare Other | Source: Ambulatory Visit | Attending: Radiation Oncology | Admitting: Radiation Oncology

## 2015-07-03 VITALS — BP 152/64 | HR 90 | Temp 98.0°F | Resp 20 | Ht 62.0 in | Wt 264.0 lb

## 2015-07-03 DIAGNOSIS — C50412 Malignant neoplasm of upper-outer quadrant of left female breast: Secondary | ICD-10-CM

## 2015-07-03 NOTE — Progress Notes (Signed)
Follow up s/p left breast rad txs 04/09/15-05/23/15, healed, some dryness on breast and scab on nipple,  Next to left axilla a knot felt, said she saw Dr. Marlou Starks 1-2 weeks ago  Will follow up in 6 months with him,  Using sonafine  Some days,  Sees Dr. Jana Hakim  08/21/15, survivorship appt with Rolley Sims NP, 07/17/15, occasional zap in breast but has diminished a lot,  Not sure if she wants to start anastrozole, will speak with med onc  4:44 PM BP 152/64 mmHg  Pulse 90  Temp(Src) 98 F (36.7 C) (Oral)  Resp 20  Ht 5\' 2"  (1.575 m)  Wt 264 lb (119.75 kg)  BMI 48.27 kg/m2  Wt Readings from Last 3 Encounters:  07/03/15 264 lb (119.75 kg)  05/21/15 266 lb 1.6 oz (120.702 kg)  05/20/15 265 lb 11.2 oz (120.521 kg)

## 2015-07-03 NOTE — Progress Notes (Signed)
Radiation Oncology         [phone] ________________________________  Name: Donna Shaffer MRN: [id number]  Date: 07/03/2015  DOB: [birth year]  Follow-Up Visit Note  CC: Myrlene Broker, MD  Jovita Kussmaul, MD    ICD-9-CM ICD-10-CM   1. Breast cancer of upper-outer quadrant of left female breast ([place]) 174.4 C50.412     Diagnosis: Left upper outer quadrant breast cancer. Stage 1 T1b N0 ductal carcinoma ER positive (100%), PR positive (85%), Her-2-neu negative  Interval Since Last Radiation: 5 weeks  04/09/2015-05/23/2015: Left breast 50.4 gray in 28 fractions, lumpectomy cavity boost 10 gray in 5 fractions  Narrative:  The patient returns today for routine follow-up. She feels a knot next to the left axilla. She reports seeing Dr. Marlou Starks 1-2 weeks ago. She is using sonafinecream on some days. Sees Dr. Jana Hakim 08/21/15, survivorship appt with Rolley Sims NP, 07/17/15, occasional sharp pain in breast but has diminished a lot, Not sure if she wants to start anastrozole, will speak with med onc   ALLERGIES:  is allergic to sulfa antibiotics.  Meds: Current Outpatient Prescriptions  Medication Sig Dispense Refill  . amLODipine (NORVASC) 5 MG tablet Take 5 mg by mouth daily.    . Calcium Citrate-Vitamin D (CALCIUM + D PO) Take 600 mg by mouth.    . citalopram (CELEXA) 40 MG tablet Take 40 mg by mouth daily.    Marland Kitchen PROAIR HFA 108 (90 Base) MCG/ACT inhaler INHALE 2 PUFFS Q 4 H PRN  3  . valsartan-hydrochlorothiazide (DIOVAN-HCT) 320-12.5 MG tablet Take 1 tablet by mouth daily.    . Wound Dressings (SONAFINE EX) Apply topically.    Marland Kitchen anastrozole (ARIMIDEX) 1 MG tablet Take 1 tablet (1 mg total) by mouth daily. (Patient not taking: Reported on 07/03/2015) 90 tablet 4  . HYDROcodone-acetaminophen (NORCO) 5-325 MG tablet Take 1 tablet by mouth every 6 (six) hours as needed for moderate pain. (Patient not taking: Reported on 07/03/2015) 30 tablet 0  . oxyCODONE-acetaminophen  (PERCOCET/ROXICET) 5-325 MG tablet Take 1-2 tablets by mouth every 6 (six) hours as needed for severe pain. (Patient not taking: Reported on 07/03/2015) 60 tablet 0  . pantoprazole (PROTONIX) 40 MG tablet Take 40 mg by mouth daily. Reported on 07/03/2015     No current facility-administered medications for this encounter.    Physical Findings: The patient is in no acute distress. Patient is alert and oriented.  height is 5' 2"  (1.575 m) and weight is 264 lb (119.75 kg). Her oral temperature is 98 F (36.7 C). Her blood pressure is 152/64 and her pulse is 90. Her respiration is 20.  Her skin has healed well. Some hyperpigmentation changes noted. Induration along the lumpectomy scar and some mild discomfort with palpation in this area. She also has a small seroma in the left axillary region. No signs of nipple discharge or bleeding. No dominant mass appreciated.  Lab Findings: Lab Results  Component Value Date   WBC 9.0 05/21/2015   HGB 14.8 05/21/2015   HCT 44.3 05/21/2015   MCV 91.3 05/21/2015   PLT 213 05/21/2015    Radiographic Findings: No results found.  Impression:  The patient is recovering from the effects of radiation.  No signs of recurrence on clinical exam today   Plan: She will follow up with Dr. Marlou Starks in approximately 6 months. I will see her back in 3 months.  ____________________________________ -----------------------------------  Blair Promise, PhD, MD  This document serves as a record of services  personally performed by Gery Pray, MD. It was created on his behalf by Darcus Austin, a trained medical scribe. The creation of this record is based on the scribe's personal observations and the provider's statements to them. This document has been checked and approved by the attending provider.

## 2015-07-17 ENCOUNTER — Encounter: Payer: Self-pay | Admitting: Nurse Practitioner

## 2015-07-17 ENCOUNTER — Ambulatory Visit (HOSPITAL_BASED_OUTPATIENT_CLINIC_OR_DEPARTMENT_OTHER): Payer: Medicare Other | Admitting: Nurse Practitioner

## 2015-07-17 VITALS — BP 138/52 | HR 75 | Temp 98.2°F | Resp 20 | Ht 62.0 in | Wt 266.7 lb

## 2015-07-17 DIAGNOSIS — C50412 Malignant neoplasm of upper-outer quadrant of left female breast: Secondary | ICD-10-CM | POA: Diagnosis not present

## 2015-07-17 NOTE — Progress Notes (Signed)
CLINIC:  Cancer Survivorship   REASON FOR VISIT:  Routine follow-up post-treatment for a recent history of breast cancer.  BRIEF ONCOLOGIC HISTORY:    Breast cancer of upper-outer quadrant of left female breast ([place])   12/31/2014 Mammogram Left breast mass warranting further imaging   01/09/2015 Breast US Left breast: angulated hypoechoic mass at 2 o'clock 12 cm from nipple measuring 0.68 x 0.51 x 0.51 cm.  Left axilla negative   01/15/2015 Initial Biopsy Left breast core needle bx: IDC< grade 1-2, ER+ (100%), PR+ (85%), HER2/neu negative (ratio 1.29), Ki67 5%   01/15/2015 Clinical Stage Stage IA: T1 N0   02/28/2015 Definitive Surgery Left lumpectomy/SLNB: IDC, grade 1, ER+, PR+, HER2/neu negative (ratio 1.02), Ki67 5%, 0/2 LN   02/28/2015 Pathologic Stage Stage IA: pT1b pN0   04/09/2015 - 05/23/2015 Radiation Therapy Adjuvant RT: Left breast 50.4 Gy in 28 fractions, lumpectomy cavity boost 10 Gy in 5 fractions    Anti-estrogen oral therapy Letrozole 2.5 mg to begin - date TBD; planned duration of therapy 5 years    INTERVAL HISTORY:  Donna Shaffer presents to the [facility] today for our initial meeting to review her survivorship care plan detailing her treatment course for breast cancer, as well as monitoring long-term side effects of that treatment, education regarding health maintenance, screening, and overall wellness and health promotion.     Overall, Donna Shaffer reports feeling quite well since completing her radiation therapy approximately one month ago. She continues with fatigue but states that she was able to work all throughout radiation.  She continues with some darkening of her skin overlying her left breast, but this is improving.  She has some intermittent breast pain, but denies redness or swelling.  She denies headache, cough, shortness of breath or bone pain.  She has a good appetite and denies any weight loss.  All in all, she states that she feels very good and is very  hesitant to initiate the anti-estrogen therapy for fear of side effects.   REVIEW OF SYSTEMS:  General: Fatigue as above. Denies fever, chills, unintentional weight loss, or night sweats. HEENT: Denies visual changes, hearing loss, mouth sores or difficulty swallowing. Cardiac: Denies palpitations, chest pain, and lower extremity edema.  Respiratory: Some dyspnea with exertion. Denies wheeze.  Breast: As above. GI: Denies abdominal pain, constipation, diarrhea, nausea, or vomiting.  GU: Denies dysuria, hematuria, vaginal bleeding, vaginal discharge, or vaginal dryness.  Musculoskeletal: Denies joint or bone pain.  Neuro: Denies recent fall or numbness / tingling in her extremities. Skin: Denies rash, pruritis, or open wounds.  Psych: Denies depression, anxiety, insomnia, or memory loss.   A 14-point review of systems was completed and was negative, except as noted above.   ONCOLOGY TREATMENT TEAM:  1. Surgeon:  Dr. Marlou Starks at [facility]  2. Medical Oncologist: Dr. Jana Hakim 3. Radiation Oncologist: Dr. Sondra Come    PAST MEDICAL/SURGICAL HISTORY:  Past Medical History  Diagnosis Date  . Hypertension   . Anxiety   . Depression   . Cancer ([place])   . Colon polyps   . Breast cancer ([place])     left  . Radiation 04/09/15-05/23/15    left breast 50.4 gray, lumpectomy cavity boost 10 gray   Past Surgical History  Procedure Laterality Date  . Cholecystectomy    . Appendectomy    . Abdominal hysterectomy    . Bilateral oophorectomy    . Radioactive seed guided mastectomy with axillary sentinel lymph node biopsy Left 02/28/2015  Procedure: RADIOACTIVE SEED GUIDED PARTIAL MASTECTOMY WITH AXILLARY SENTINEL LYMPH NODE BIOPSY;  Surgeon: Autumn Messing III, MD;  Location: [place];  Service: General;  Laterality: Left;  . Cataract extraction Bilateral      ALLERGIES:  Allergies  Allergen Reactions  . Sulfa Antibiotics Swelling     CURRENT MEDICATIONS:  Current  Outpatient Prescriptions on File Prior to Visit  Medication Sig Dispense Refill  . amLODipine (NORVASC) 5 MG tablet Take 5 mg by mouth daily.    Marland Kitchen anastrozole (ARIMIDEX) 1 MG tablet Take 1 tablet (1 mg total) by mouth daily. (Patient not taking: Reported on 07/03/2015) 90 tablet 4  . Calcium Citrate-Vitamin D (CALCIUM + D PO) Take 600 mg by mouth.    . citalopram (CELEXA) 40 MG tablet Take 40 mg by mouth daily.    Marland Kitchen HYDROcodone-acetaminophen (NORCO) 5-325 MG tablet Take 1 tablet by mouth every 6 (six) hours as needed for moderate pain. (Patient not taking: Reported on 07/03/2015) 30 tablet 0  . oxyCODONE-acetaminophen (PERCOCET/ROXICET) 5-325 MG tablet Take 1-2 tablets by mouth every 6 (six) hours as needed for severe pain. (Patient not taking: Reported on 07/03/2015) 60 tablet 0  . pantoprazole (PROTONIX) 40 MG tablet Take 40 mg by mouth daily. Reported on 07/03/2015    . PROAIR HFA 108 (90 Base) MCG/ACT inhaler INHALE 2 PUFFS Q 4 H PRN  3  . valsartan-hydrochlorothiazide (DIOVAN-HCT) 320-12.5 MG tablet Take 1 tablet by mouth daily.    . Wound Dressings (SONAFINE EX) Apply topically.     No current facility-administered medications on file prior to visit.     ONCOLOGIC FAMILY HISTORY:  Family History  Problem Relation Age of Onset  . Melanoma Mother   . Skin cancer Father      GENETIC COUNSELING/TESTING: No    SOCIAL HISTORY:  Donna Shaffer is single and lives alone in [place], [place].  She has 2 children and 4 grandchildren. 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  She denies any current or history of tobacco, alcohol, or illicit drug use.     PHYSICAL EXAMINATION:  Vital Signs: Filed Vitals:   07/17/15 1310  BP: 138/52  Pulse: 75  Temp: 98.2 F (36.8 C)  Resp: 20   ECOG Performance Status: 0  General: Well-nourished, well-appearing female in no acute distress.  She is unaccompanied in clinic today.   HEENT: Head is atraumatic and normocephalic.   Pupils equal and reactive to light and accomodation. Conjunctivae clear without exudate.  Sclerae anicteric. Oral mucosa is pink, moist, and intact without lesions.  Oropharynx is pink without lesions or erythema.  Lymph: No cervical, supraclavicular, infraclavicular, or axillary lymphadenopathy noted on palpation.  Cardiovascular: Regular rate and rhythm without murmurs, rubs, or gallops. Respiratory: Clear to auscultation bilaterally. Chest expansion symmetric without accessory muscle use on inspiration or expiration.  GI: Abdomen soft and round. No tenderness to palpation. Bowel sounds normoactive in 4 quadrants.  GU: Deferred.   Neuro: No focal deficits. Steady gait.  Psych: Mood and affect normal and appropriate for situation.  Extremities: No edema, cyanosis, or clubbing.  Skin: Warm and dry. No open lesions noted.   LABORATORY DATA:  None for this visit.  DIAGNOSTIC IMAGING:  None for this visit.     ASSESSMENT AND PLAN:   1. 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  Donna Shaffer is doing well without clinical symptoms worrisome for disease recurrence. She will follow-up with her medical oncologist,  Dr. Jana Hakim, in June 2017 with history and physical examination per surveillance protocol. She will see Dr. Sondra Come in July 2017.  At this time, she is leaning towards not initiating endocrine therapy due to potential side effects vs. low risk of recurrence.  She feels great in clinic today and is worried about her quality of life from the medication. She will consider this further and discuss it with Dr. Jana Hakim at her upcoming appointment. A comprehensive survivorship care plan and treatment summary was reviewed with the patient  today detailing her breast cancer diagnosis, treatment course, potential late/long-term effects of treatment, appropriate follow-up care with recommendations for the future, and patient education resources.  A copy of this summary, along with a letter will be sent to the patient's primary care provider via in basket message after today's visit.  Donna Shaffer is welcome to return to the Survivorship Clinic in the future, as needed; no follow-up will be scheduled at this time.    2. Cancer screening:  Due to Donna Shaffer's history and her age, she should receive screening for skin cancers and colon cancer. She is S/P hysterectomy.  The information and recommendations are listed on the patient's comprehensive care plan/treatment summary and were reviewed in detail with the patient.    3. Health maintenance and wellness promotion: Donna Shaffer was encouraged to consume 5-7 servings of fruits and vegetables per day. We reviewed the "Nutrition Rainbow" handout, as well as discussed recommendations to maximize nutrition and minimize recurrence, such as increased intake of fruits, vegetables, lean proteins, and minimizing the intake of red meats and processed foods.  She was also encouraged to engage in moderate to vigorous exercise for 30 minutes per day most days of the week. We discussed the [employer] fitness program, which is designed for cancer survivors to help them become more physically fit after cancer treatments.  She was instructed to limit her alcohol consumption and continue to abstain from tobacco use.  A copy of the "Take Control of Your Health" brochure was given to her reinforcing these recommendations.   4. Support services/counseling: It is not uncommon for this period of the patient's cancer care trajectory to be one of many emotions and stressors. We discussed an opportunity for her to participate in the next session of [facility] ("Finding Your New Normal") support group series designed for patients  after they have completed treatment.  Donna Shaffer was encouraged to take advantage of our many other support services programs, support groups, and/or counseling in coping with her new life as a cancer survivor after completing anti-cancer treatment.  She was offered support today through active listening and expressive supportive counseling. She was given information regarding our available services and encouraged to contact me with any questions or for help enrolling in any of our support group/programs.    A total of 60 minutes of face-to-face time was spent with this patient with greater than 50% of that time in counseling and care-coordination.   Sylvan Cheese, NP  Survivorship Program [place] [phone]   Note: PRIMARY CARE PROVIDER Myrlene Broker, MD None [phone]

## 2015-08-12 ENCOUNTER — Other Ambulatory Visit: Payer: Self-pay | Admitting: Oncology

## 2015-08-20 ENCOUNTER — Encounter: Payer: Self-pay | Admitting: Adult Health

## 2015-08-20 NOTE — Progress Notes (Signed)
A birthday card was mailed to the patient today on behalf of the Survivorship Program at [facility].   Gretchen Dawson, NP Survivorship Program [facility] [phone]  

## 2015-08-21 ENCOUNTER — Ambulatory Visit: Payer: Medicare Other | Admitting: Oncology

## 2015-08-26 ENCOUNTER — Ambulatory Visit (HOSPITAL_BASED_OUTPATIENT_CLINIC_OR_DEPARTMENT_OTHER): Payer: Medicare Other | Admitting: Oncology

## 2015-08-26 ENCOUNTER — Telehealth: Payer: Self-pay | Admitting: Oncology

## 2015-08-26 VITALS — BP 151/62 | HR 85 | Temp 98.3°F | Resp 20 | Ht 62.0 in | Wt 269.9 lb

## 2015-08-26 DIAGNOSIS — I10 Essential (primary) hypertension: Secondary | ICD-10-CM | POA: Diagnosis not present

## 2015-08-26 DIAGNOSIS — Z170 Estrogen receptor positive status [ER+]: Secondary | ICD-10-CM

## 2015-08-26 DIAGNOSIS — C50412 Malignant neoplasm of upper-outer quadrant of left female breast: Secondary | ICD-10-CM | POA: Diagnosis not present

## 2015-08-26 HISTORY — DX: Morbid (severe) obesity due to excess calories: E66.01

## 2015-08-26 MED ORDER — VITAMIN D 1000 UNITS PO TABS: 1000.0000 [IU] | ORAL_TABLET | Freq: Every day | ORAL | Status: DC

## 2015-08-26 NOTE — Telephone Encounter (Signed)
Gave and printed appt sched and avs for pt for NOv

## 2015-08-26 NOTE — Progress Notes (Signed)
[place]  [phone] [phone]     ID: CHANDLAR STAEBELL DOB: 1945-10-23  MR#: [id number]  HYQ#:[id number]  Patient Care Team: Myrlene Broker, MD as PCP - General (Family Medicine) Chauncey Cruel, MD as Consulting Physician (Oncology) Autumn Messing III, MD as Consulting Physician (General Surgery) Kyra Leyland, MD as Consulting Physician (Unknown Physician Specialty) Armandina Stammer, MD (Obstetrics and Gynecology) Yvone Neu, MD as Referring Physician (Family Medicine) Marchia Bond, MD as Consulting Physician (Orthopedic Surgery) Gery Pray, MD as Consulting Physician (Radiation Oncology) Sylvan Cheese, NP as Nurse Practitioner (Hematology and Oncology) PCP: Myrlene Broker, MD OTHER MD:  CHIEF COMPLAINT: Estrogen receptor positive breast cancer  CURRENT TREATMENT: Awaiting adjuvant radiation   BREAST CANCER HISTORY: From the original intake note:  "Will" had bilateral [facility] 01[facility]2015  showing a possible mass in the left breast. Left diagnostic mammography with tomosynthesis and left breast ultrasonography 01/09/2015 found a spiculated mass in the upper outer quadrant of the left breast which on ultrasonography measured 0.68 cm. Ultrasound of the left axilla was benign.  On 01/15/2015 the patient underwent left upper outer quadrant breast biopsy, with the pathology ([id number]) showing an invasive ductal carcinoma, grade 1 or 2, estrogen receptor 100% positive, progesterone receptor 85% positive, both with strong staining intensity, with an MIB-1 of 5% and no HER-2 amplification, the signals ratio being 1.29 and the number per cell 2.52.  On 02/28/2015 the patient underwent radioactive seed guided left lumpectomy with axillary sentinel lymph node sampling. The final pathology from this procedure ([id number]) confirmed an invasive ductal carcinoma measuring 0.8 cm, grade 1,  with ample margins, and both sentinel lymph nodes clear. Repeat HER-2 was again negative, with a signals ratio 1.07, the number per cell being 1.60.  The patient's subsequent history is as detailed below   INTERVAL HISTORY: Will returns today for follow-up of her estrogen receptor positive breast cancer. She was supposed to have started anastrozole in April, but she never got around to it. She did purchased the medication, which initially seemed like it was going to cause $600 but ended up costing her $12. However she has not started it because she is concerned that she really has back and knee pain as well as pain in the left armpit and left breast area and didn't want the patient get worse and 9 she was going to be able to tell the difference whether the pain was due to arthritis or to the pain medication. Of note she does not have baseline hot flashes or baseline vaginal dryness problems.  REVIEW OF SYSTEMS: She hurts in the lateral left breast and left axilla area. Is more soreness and anything else. It is intermittent and sometimes feels stabbing. She also has pain in her lower back and knees which is or persistent, but not more intense than before. She sleeps very poorly. She will like that many hours without being able to fall asleep. She will sleep during the day. In addition she has trouble swallowing sometimes and she has had esophageal dilatation 2, most recently October 2016. She feels short of breath with walking, has significant sinus problems, and feels moderately fatigued. Otherwise a detailed review of systems today was noncontributory  PAST MEDICAL HISTORY: Past Medical History  Diagnosis Date  . Hypertension   . Anxiety   . Depression   . Cancer ([place])   . Colon polyps   . Breast cancer ([place])  left  . Radiation 04/09/15-05/23/15    left breast 50.4 gray, lumpectomy cavity boost 10 gray    PAST SURGICAL HISTORY: Past Surgical History  Procedure Laterality Date  .  Cholecystectomy    . Appendectomy    . Abdominal hysterectomy    . Bilateral oophorectomy    . Radioactive seed guided mastectomy with axillary sentinel lymph node biopsy Left 02/28/2015    Procedure: RADIOACTIVE SEED GUIDED PARTIAL MASTECTOMY WITH AXILLARY SENTINEL LYMPH NODE BIOPSY;  Surgeon: Autumn Messing III, MD;  Location: [place];  Service: General;  Laterality: Left;  . Cataract extraction Bilateral     FAMILY HISTORY Family History  Problem Relation Age of Onset  . Melanoma Mother   . Skin cancer Father    The patient's father died at the age of 80 from complications of COPD in the setting of tobacco abuse. The patient's mother died at the age of 79 from "failure to thrive". The patient had no brothers, one sister. There is no history of breast or ovarian cancer in the family.  GYNECOLOGIC HISTORY:  No LMP recorded. Patient has had a hysterectomy. Menarche age 51, first live birth age 84, the patient is [place] P2. She had a hysterectomy at the age of 76 and took hormone replacement until her 43s. She subsequently underwent bilateral salpingo-oophorectomy.  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 She is single and lives alone with her 15-year-old dog. Daughter [name] lives in [place]. She is disabled secondary to multiple orthopedic problems as well as a history of pulmonary embolism. Son [name] lives in [place] where he works for [employer]. The patient has 4 grandchildren. She is not a church attender    ADVANCED DIRECTIVES: Not in place. The patient tells me she intends to name her daughter [name] as her healthcare power of attorney. [name] can be reached at [place]: Social History  Substance Use Topics  . Smoking status: Never Smoker   . Smokeless tobacco: Never Used  . Alcohol Use: No     Colonoscopy: October 2016/my's and  high  PAP: Status post hysterectomy  Bone density: 2014/normal  Lipid panel:  Allergies  Allergen Reactions  . Sulfa Antibiotics Swelling    Current Outpatient Prescriptions  Medication Sig Dispense Refill  . amLODipine (NORVASC) 5 MG tablet Take 5 mg by mouth daily.    Marland Kitchen anastrozole (ARIMIDEX) 1 MG tablet Take 1 tablet (1 mg total) by mouth daily. (Patient not taking: Reported on 07/17/2015) 90 tablet 4  . Calcium Citrate-Vitamin D (CALCIUM + D PO) Take 600 mg by mouth.    . citalopram (CELEXA) 40 MG tablet Take 40 mg by mouth daily.    . pantoprazole (PROTONIX) 40 MG tablet Take 40 mg by mouth daily. Reported on 07/03/2015    . PROAIR HFA 108 (90 Base) MCG/ACT inhaler INHALE 2 PUFFS Q 4 H PRN  3  . valsartan-hydrochlorothiazide (DIOVAN-HCT) 320-12.5 MG tablet Take 1 tablet by mouth daily.     No current facility-administered medications for this visit.    OBJECTIVE: Middle-aged white woman Who appears stated age  87 Vitals:   08/26/15 1451  BP: 151/62  Pulse: 85  Temp: 98.3 F (36.8 C)  Resp: 20     Body mass index is 49.35 kg/(m^2).    ECOG FS:2 - Symptomatic, <50% confined to bed  Sclerae unicteric, EOMs intact Oropharynx  clear and moist No cervical or supraclavicular adenopathy Lungs no rales or rhonchi Heart regular rate and rhythm Abd soft, obese, nontender, positive bowel sounds MSK no focal spinal tenderness, no left upper extremity lymphedema Neuro: nonfocal, well oriented, appropriate affect Breasts: The right breast is unremarkable. The left breast still has mild erythema. There is some skin thickening related to the prior radiation. There is no evidence of disease recurrence. The left axilla is benign.   LAB RESULTS:  CMP     Component Value Date/Time   NA 140 05/21/2015 1138   NA 140 02/21/2015 1200   K 3.5 05/21/2015 1138   K 4.9 02/21/2015 1200   CL 100* 02/21/2015 1200   CO2 29 05/21/2015 1138   CO2 32 02/21/2015 1200   GLUCOSE 111 05/21/2015  1138   GLUCOSE 119* 02/21/2015 1200   BUN 18.2 05/21/2015 1138   BUN 15 02/21/2015 1200   CREATININE 0.7 05/21/2015 1138   CREATININE 0.92 02/21/2015 1200   CALCIUM 9.0 05/21/2015 1138   CALCIUM 9.4 02/21/2015 1200   PROT 6.9 05/21/2015 1138   ALBUMIN 3.5 05/21/2015 1138   AST 33 05/21/2015 1138   ALT 37 05/21/2015 1138   ALKPHOS 94 05/21/2015 1138   BILITOT 0.60 05/21/2015 1138   GFRNONAA >60 02/21/2015 1200   GFRAA >60 02/21/2015 1200    INo results found for: SPEP, UPEP  Lab Results  Component Value Date   WBC 9.0 05/21/2015   NEUTROABS 6.1 05/21/2015   HGB 14.8 05/21/2015   HCT 44.3 05/21/2015   MCV 91.3 05/21/2015   PLT 213 05/21/2015      Chemistry      Component Value Date/Time   NA 140 05/21/2015 1138   NA 140 02/21/2015 1200   K 3.5 05/21/2015 1138   K 4.9 02/21/2015 1200   CL 100* 02/21/2015 1200   CO2 29 05/21/2015 1138   CO2 32 02/21/2015 1200   BUN 18.2 05/21/2015 1138   BUN 15 02/21/2015 1200   CREATININE 0.7 05/21/2015 1138   CREATININE 0.92 02/21/2015 1200      Component Value Date/Time   CALCIUM 9.0 05/21/2015 1138   CALCIUM 9.4 02/21/2015 1200   ALKPHOS 94 05/21/2015 1138   AST 33 05/21/2015 1138   ALT 37 05/21/2015 1138   BILITOT 0.60 05/21/2015 1138       No results found for: LABCA2  No components found for: LABCA125  No results for input(s): INR in the last 168 hours.  Urinalysis No results found for: COLORURINE, APPEARANCEUR, LABSPEC, PHURINE, GLUCOSEU, HGBUR, BILIRUBINUR, KETONESUR, PROTEINUR, UROBILINOGEN, NITRITE, LEUKOCYTESUR  STUDIES: No results found.  ASSESSMENT: 70 y.o. [place], [place] woman s/p left upper outer quadrant lumpectomy 02/28/2015 for a pT1b pN0, stage IA  invasive ductal carcinoma, grade 1, estrogen and progesterone receptor positive, HER-2 not amplified, with an MIB-1 of 5%.   (1) adjuvant radiation 04/09/2015-05/23/2015: Left breast 50.4 gray in 28 fractions, lumpectomy cavity boost 10 gray in 5  fractions  (2) Considering anastrozole  PLAN: Will is very reluctant to start any medication but particularly anastrozole. She is concerned about the aches and pains. She is worried about in weakening her bones and then her falling and breaking her leg. The hot flashes and vaginal dryness issues develop before her very much.  We reviewed the reason she might want to take an aromatase inhibitor. Basically this will cause her risk of breast cancer in half. I quoted her risk of local recurrence in the 10% range and a  risk of systemic recurrence in the 8-12% range. She also has a risk of developing a new breast cancer in either breast and that can be as high as 1% per year. Each one of his risks would be cut in half she was able to take an aromatase inhibitor for 5 years.  At this point she is considering perhaps giving anastrozole a month or 2 trial and see if it is something she can take.  She does need to increase her vitamin D intake and I have suggested she start 1000 mg daily in addition to her calcium and D supplementation.  She has very disturbed sleep habits and I wonder if she would benefit from a formal sleep study. She will discuss that with her primary care physician.  Otherwise she will have her mammography in October, sending as her daughter, and return to see me in November. She knows to call for any problems that may develop before that visit.   Chauncey Cruel, MD   08/26/2015 2:55 PM Medical Oncology and Hematology [facility] [address] [place], [place] [address] Tel. [phone]    Fax. [phone]

## 2015-10-09 ENCOUNTER — Ambulatory Visit: Admission: RE | Admit: 2015-10-09 | Payer: Medicare Other | Source: Ambulatory Visit | Admitting: Radiation Oncology

## 2015-10-27 DIAGNOSIS — L219 Seborrheic dermatitis, unspecified: Secondary | ICD-10-CM | POA: Diagnosis not present

## 2015-10-27 DIAGNOSIS — L400 Psoriasis vulgaris: Secondary | ICD-10-CM | POA: Diagnosis not present

## 2015-11-10 DIAGNOSIS — R079 Chest pain, unspecified: Secondary | ICD-10-CM | POA: Diagnosis not present

## 2015-11-10 DIAGNOSIS — Z0000 Encounter for general adult medical examination without abnormal findings: Secondary | ICD-10-CM | POA: Diagnosis not present

## 2015-11-10 DIAGNOSIS — R0609 Other forms of dyspnea: Secondary | ICD-10-CM | POA: Diagnosis not present

## 2015-11-27 DIAGNOSIS — L400 Psoriasis vulgaris: Secondary | ICD-10-CM | POA: Diagnosis not present

## 2015-12-09 DIAGNOSIS — Z13220 Encounter for screening for lipoid disorders: Secondary | ICD-10-CM | POA: Diagnosis not present

## 2015-12-09 DIAGNOSIS — Z1159 Encounter for screening for other viral diseases: Secondary | ICD-10-CM | POA: Diagnosis not present

## 2015-12-09 DIAGNOSIS — Z79899 Other long term (current) drug therapy: Secondary | ICD-10-CM | POA: Diagnosis not present

## 2015-12-09 DIAGNOSIS — E1165 Type 2 diabetes mellitus with hyperglycemia: Secondary | ICD-10-CM | POA: Diagnosis not present

## 2015-12-09 DIAGNOSIS — I10 Essential (primary) hypertension: Secondary | ICD-10-CM | POA: Diagnosis not present

## 2015-12-09 DIAGNOSIS — K219 Gastro-esophageal reflux disease without esophagitis: Secondary | ICD-10-CM | POA: Diagnosis not present

## 2015-12-09 DIAGNOSIS — R079 Chest pain, unspecified: Secondary | ICD-10-CM | POA: Diagnosis not present

## 2016-01-02 ENCOUNTER — Ambulatory Visit
Admission: RE | Admit: 2016-01-02 | Discharge: 2016-01-02 | Disposition: A | Discharge status: Home / Self Care | Payer: Medicare Other | Source: Ambulatory Visit | Attending: Oncology | Admitting: Oncology

## 2016-01-02 DIAGNOSIS — R928 Other abnormal and inconclusive findings on diagnostic imaging of breast: Secondary | ICD-10-CM | POA: Diagnosis not present

## 2016-01-02 DIAGNOSIS — C50412 Malignant neoplasm of upper-outer quadrant of left female breast: Secondary | ICD-10-CM

## 2016-01-23 DIAGNOSIS — M25552 Pain in left hip: Secondary | ICD-10-CM | POA: Diagnosis not present

## 2016-01-23 DIAGNOSIS — S29012A Strain of muscle and tendon of back wall of thorax, initial encounter: Secondary | ICD-10-CM | POA: Diagnosis not present

## 2016-01-26 DIAGNOSIS — H26492 Other secondary cataract, left eye: Secondary | ICD-10-CM | POA: Diagnosis not present

## 2016-02-03 ENCOUNTER — Ambulatory Visit: Payer: Medicare Other | Admitting: Oncology

## 2016-02-03 ENCOUNTER — Encounter: Payer: Self-pay | Admitting: Oncology

## 2016-02-03 ENCOUNTER — Other Ambulatory Visit: Payer: Medicare Other

## 2016-02-23 DIAGNOSIS — C50412 Malignant neoplasm of upper-outer quadrant of left female breast: Secondary | ICD-10-CM | POA: Diagnosis not present

## 2016-03-08 DIAGNOSIS — I10 Essential (primary) hypertension: Secondary | ICD-10-CM | POA: Diagnosis not present

## 2016-03-08 DIAGNOSIS — M79662 Pain in left lower leg: Secondary | ICD-10-CM | POA: Diagnosis not present

## 2016-05-14 DIAGNOSIS — Z13820 Encounter for screening for osteoporosis: Secondary | ICD-10-CM | POA: Diagnosis not present

## 2016-05-14 DIAGNOSIS — M859 Disorder of bone density and structure, unspecified: Secondary | ICD-10-CM | POA: Diagnosis not present

## 2016-05-14 DIAGNOSIS — J40 Bronchitis, not specified as acute or chronic: Secondary | ICD-10-CM | POA: Diagnosis not present

## 2016-05-14 DIAGNOSIS — M5432 Sciatica, left side: Secondary | ICD-10-CM | POA: Diagnosis not present

## 2016-05-31 DIAGNOSIS — M5417 Radiculopathy, lumbosacral region: Secondary | ICD-10-CM | POA: Diagnosis not present

## 2016-05-31 DIAGNOSIS — M25552 Pain in left hip: Secondary | ICD-10-CM | POA: Diagnosis not present

## 2016-05-31 DIAGNOSIS — M25551 Pain in right hip: Secondary | ICD-10-CM | POA: Diagnosis not present

## 2016-06-01 DIAGNOSIS — M5416 Radiculopathy, lumbar region: Secondary | ICD-10-CM | POA: Diagnosis not present

## 2016-06-08 ENCOUNTER — Other Ambulatory Visit: Payer: Self-pay | Admitting: Family Medicine

## 2016-06-08 DIAGNOSIS — M5416 Radiculopathy, lumbar region: Secondary | ICD-10-CM

## 2016-06-14 ENCOUNTER — Ambulatory Visit
Admission: RE | Admit: 2016-06-14 | Discharge: 2016-06-14 | Disposition: A | Discharge status: Home / Self Care | Payer: Medicare Other | Source: Ambulatory Visit | Attending: Family Medicine | Admitting: Family Medicine

## 2016-06-14 DIAGNOSIS — M5416 Radiculopathy, lumbar region: Secondary | ICD-10-CM

## 2016-06-14 DIAGNOSIS — M48061 Spinal stenosis, lumbar region without neurogenic claudication: Secondary | ICD-10-CM | POA: Diagnosis not present

## 2016-06-14 MED ORDER — GADOBENATE DIMEGLUMINE 529 MG/ML IV SOLN
20.0000 mL | Freq: Once | INTRAVENOUS | Status: AC | PRN
  Administered 2016-06-14: 20 mL via INTRAVENOUS

## 2016-06-17 DIAGNOSIS — M549 Dorsalgia, unspecified: Secondary | ICD-10-CM | POA: Diagnosis not present

## 2016-06-17 DIAGNOSIS — M48062 Spinal stenosis, lumbar region with neurogenic claudication: Secondary | ICD-10-CM | POA: Diagnosis not present

## 2016-06-17 DIAGNOSIS — M47816 Spondylosis without myelopathy or radiculopathy, lumbar region: Secondary | ICD-10-CM | POA: Diagnosis not present

## 2016-06-23 DIAGNOSIS — M545 Low back pain: Secondary | ICD-10-CM | POA: Diagnosis not present

## 2016-06-23 DIAGNOSIS — M6281 Muscle weakness (generalized): Secondary | ICD-10-CM | POA: Diagnosis not present

## 2016-06-23 DIAGNOSIS — R2689 Other abnormalities of gait and mobility: Secondary | ICD-10-CM | POA: Diagnosis not present

## 2016-06-25 DIAGNOSIS — M545 Low back pain: Secondary | ICD-10-CM | POA: Diagnosis not present

## 2016-06-25 DIAGNOSIS — R2689 Other abnormalities of gait and mobility: Secondary | ICD-10-CM | POA: Diagnosis not present

## 2016-06-25 DIAGNOSIS — M6281 Muscle weakness (generalized): Secondary | ICD-10-CM | POA: Diagnosis not present

## 2016-07-02 DIAGNOSIS — R2689 Other abnormalities of gait and mobility: Secondary | ICD-10-CM | POA: Diagnosis not present

## 2016-07-02 DIAGNOSIS — M545 Low back pain: Secondary | ICD-10-CM | POA: Diagnosis not present

## 2016-07-02 DIAGNOSIS — M6281 Muscle weakness (generalized): Secondary | ICD-10-CM | POA: Diagnosis not present

## 2016-07-05 DIAGNOSIS — M48062 Spinal stenosis, lumbar region with neurogenic claudication: Secondary | ICD-10-CM | POA: Diagnosis not present

## 2016-07-19 DIAGNOSIS — M48062 Spinal stenosis, lumbar region with neurogenic claudication: Secondary | ICD-10-CM | POA: Diagnosis not present

## 2016-07-19 DIAGNOSIS — M791 Myalgia: Secondary | ICD-10-CM | POA: Diagnosis not present

## 2016-07-19 DIAGNOSIS — M47816 Spondylosis without myelopathy or radiculopathy, lumbar region: Secondary | ICD-10-CM | POA: Diagnosis not present

## 2016-08-09 DIAGNOSIS — Z5320 Procedure and treatment not carried out because of patient's decision for unspecified reasons: Secondary | ICD-10-CM | POA: Diagnosis not present

## 2016-08-09 DIAGNOSIS — R51 Headache: Secondary | ICD-10-CM | POA: Diagnosis not present

## 2016-08-09 DIAGNOSIS — B379 Candidiasis, unspecified: Secondary | ICD-10-CM | POA: Diagnosis not present

## 2016-08-09 DIAGNOSIS — G43909 Migraine, unspecified, not intractable, without status migrainosus: Secondary | ICD-10-CM | POA: Diagnosis not present

## 2016-08-09 DIAGNOSIS — Z7982 Long term (current) use of aspirin: Secondary | ICD-10-CM | POA: Diagnosis not present

## 2016-08-09 DIAGNOSIS — L03311 Cellulitis of abdominal wall: Secondary | ICD-10-CM | POA: Diagnosis not present

## 2016-08-09 DIAGNOSIS — I10 Essential (primary) hypertension: Secondary | ICD-10-CM | POA: Diagnosis not present

## 2016-08-09 DIAGNOSIS — Z79899 Other long term (current) drug therapy: Secondary | ICD-10-CM | POA: Diagnosis not present

## 2016-08-24 DIAGNOSIS — C50412 Malignant neoplasm of upper-outer quadrant of left female breast: Secondary | ICD-10-CM | POA: Diagnosis not present

## 2016-09-07 DIAGNOSIS — L0390 Cellulitis, unspecified: Secondary | ICD-10-CM | POA: Diagnosis not present

## 2016-09-23 DIAGNOSIS — M791 Myalgia: Secondary | ICD-10-CM | POA: Diagnosis not present

## 2016-09-23 DIAGNOSIS — M47816 Spondylosis without myelopathy or radiculopathy, lumbar region: Secondary | ICD-10-CM | POA: Diagnosis not present

## 2016-09-23 DIAGNOSIS — M48062 Spinal stenosis, lumbar region with neurogenic claudication: Secondary | ICD-10-CM | POA: Diagnosis not present

## 2016-10-06 DIAGNOSIS — M47816 Spondylosis without myelopathy or radiculopathy, lumbar region: Secondary | ICD-10-CM | POA: Diagnosis not present

## 2016-10-19 DIAGNOSIS — L409 Psoriasis, unspecified: Secondary | ICD-10-CM | POA: Diagnosis not present

## 2016-10-19 DIAGNOSIS — I10 Essential (primary) hypertension: Secondary | ICD-10-CM | POA: Diagnosis not present

## 2016-10-20 DIAGNOSIS — M47816 Spondylosis without myelopathy or radiculopathy, lumbar region: Secondary | ICD-10-CM | POA: Diagnosis not present

## 2016-11-23 ENCOUNTER — Other Ambulatory Visit: Payer: Self-pay | Admitting: General Surgery

## 2016-11-23 ENCOUNTER — Other Ambulatory Visit: Payer: Self-pay | Admitting: Oncology

## 2016-11-23 DIAGNOSIS — Z853 Personal history of malignant neoplasm of breast: Secondary | ICD-10-CM

## 2016-11-24 DIAGNOSIS — E1165 Type 2 diabetes mellitus with hyperglycemia: Secondary | ICD-10-CM

## 2016-11-24 DIAGNOSIS — E119 Type 2 diabetes mellitus without complications: Secondary | ICD-10-CM

## 2016-11-24 DIAGNOSIS — Z13220 Encounter for screening for lipoid disorders: Secondary | ICD-10-CM | POA: Diagnosis not present

## 2016-11-24 DIAGNOSIS — J40 Bronchitis, not specified as acute or chronic: Secondary | ICD-10-CM | POA: Diagnosis not present

## 2016-11-24 DIAGNOSIS — K219 Gastro-esophageal reflux disease without esophagitis: Secondary | ICD-10-CM

## 2016-11-24 DIAGNOSIS — E1169 Type 2 diabetes mellitus with other specified complication: Secondary | ICD-10-CM | POA: Insufficient documentation

## 2016-11-24 DIAGNOSIS — I10 Essential (primary) hypertension: Secondary | ICD-10-CM | POA: Diagnosis not present

## 2016-11-24 HISTORY — DX: Gastro-esophageal reflux disease without esophagitis: K21.9

## 2016-11-24 HISTORY — DX: Type 2 diabetes mellitus with other specified complication: E11.69

## 2016-11-24 HISTORY — DX: Type 2 diabetes mellitus without complications: E11.9

## 2016-11-24 HISTORY — DX: Type 2 diabetes mellitus with hyperglycemia: E11.65

## 2016-12-29 DIAGNOSIS — J4541 Moderate persistent asthma with (acute) exacerbation: Secondary | ICD-10-CM | POA: Diagnosis not present

## 2016-12-29 DIAGNOSIS — R259 Unspecified abnormal involuntary movements: Secondary | ICD-10-CM | POA: Diagnosis not present

## 2016-12-29 DIAGNOSIS — K140 Glossitis: Secondary | ICD-10-CM | POA: Diagnosis not present

## 2016-12-29 DIAGNOSIS — J3089 Other allergic rhinitis: Secondary | ICD-10-CM | POA: Diagnosis not present

## 2017-01-04 ENCOUNTER — Other Ambulatory Visit: Payer: Self-pay | Admitting: General Surgery

## 2017-01-04 ENCOUNTER — Ambulatory Visit
Admission: RE | Admit: 2017-01-04 | Discharge: 2017-01-04 | Disposition: A | Discharge status: Home / Self Care | Payer: Medicare Other | Source: Ambulatory Visit | Attending: General Surgery | Admitting: General Surgery

## 2017-01-04 DIAGNOSIS — R928 Other abnormal and inconclusive findings on diagnostic imaging of breast: Secondary | ICD-10-CM | POA: Diagnosis not present

## 2017-01-04 DIAGNOSIS — R921 Mammographic calcification found on diagnostic imaging of breast: Secondary | ICD-10-CM

## 2017-01-04 DIAGNOSIS — Z853 Personal history of malignant neoplasm of breast: Secondary | ICD-10-CM

## 2017-01-04 HISTORY — DX: Personal history of irradiation: Z92.3

## 2017-07-07 ENCOUNTER — Ambulatory Visit
Admission: RE | Admit: 2017-07-07 | Discharge: 2017-07-07 | Disposition: A | Discharge status: Home / Self Care | Payer: Medicare Other | Source: Ambulatory Visit | Attending: General Surgery | Admitting: General Surgery

## 2017-07-07 DIAGNOSIS — R921 Mammographic calcification found on diagnostic imaging of breast: Secondary | ICD-10-CM

## 2017-08-07 IMAGING — MR MR LUMBAR SPINE WO/W CM
5 of 7 series · 26 of 48 positions shown · IV contrast (20ml Multihance)
Comparison: MRI dated 01/24/2009

CLINICAL DATA: Six-month history of low back and left buttock pain
with numbness in the right foot. Lumbar radiculopathy.

EXAM:
MRI LUMBAR SPINE WITHOUT AND WITH CONTRAST
TECHNIQUE: Multiplanar and multiecho pulse sequences of the lumbar spine were
obtained without and with intravenous contrast.
CONTRAST:  20mL MULTIHANCE GADOBENATE DIMEGLUMINE 529 MG/ML IV SOLN

[Series 4: T1 · sagittal · 4.0mm · 0.55mm/px · 5 of 13 slices shown (1 of 2)]
[im 1/13]
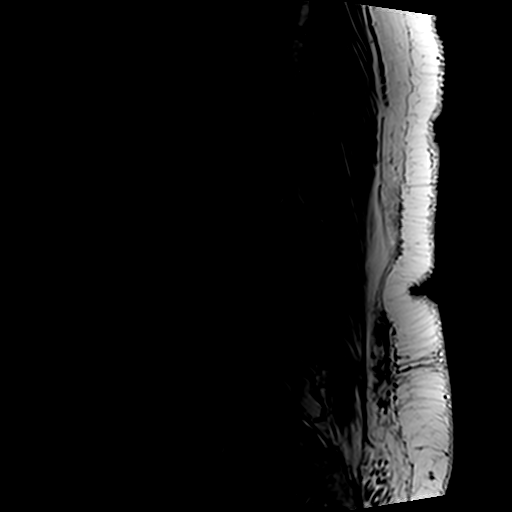
[im 4/13]
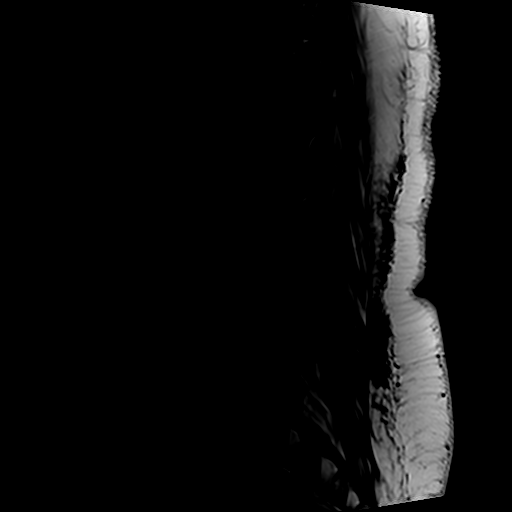
[im 7/13]
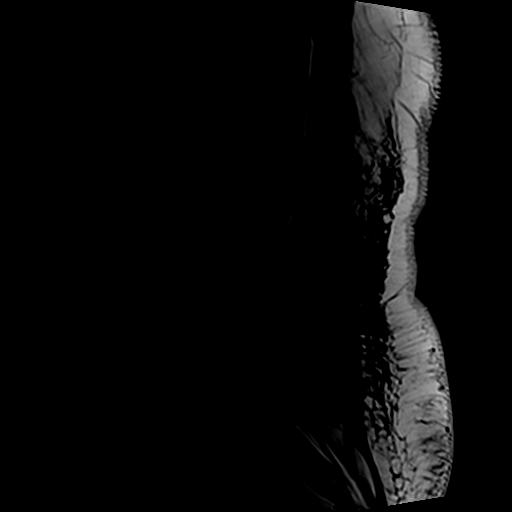
[im 10/13]
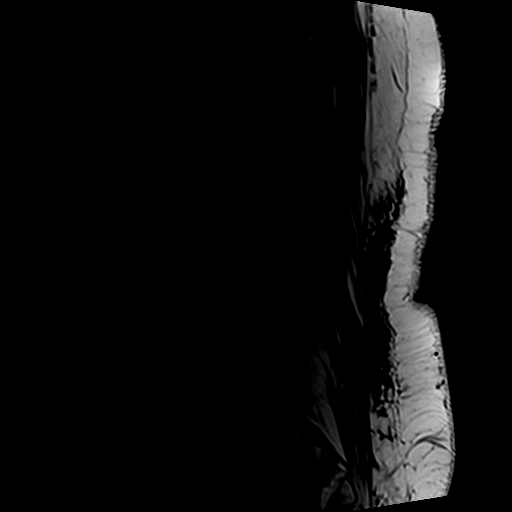
[im 13/13]
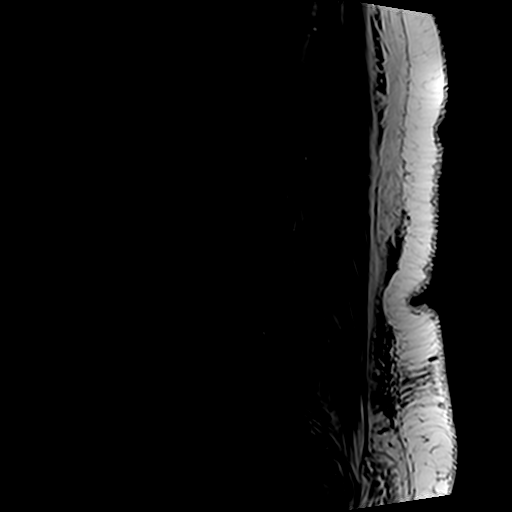

[Series 5: T1 · axial · 4.0mm · 0.35mm/px · z∈[-63,+111]mm · 8 of 32 slices shown (2 of 2)]
[im 1/32]
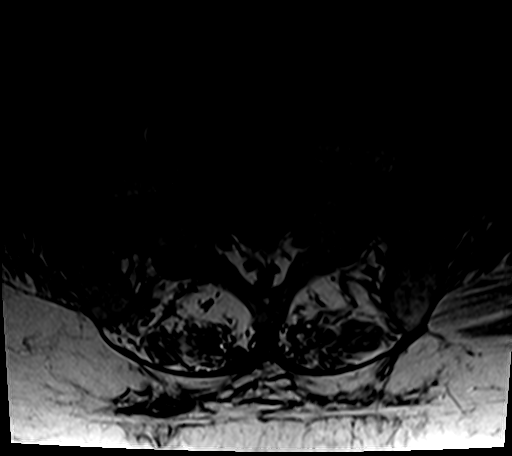
[im 4/32]
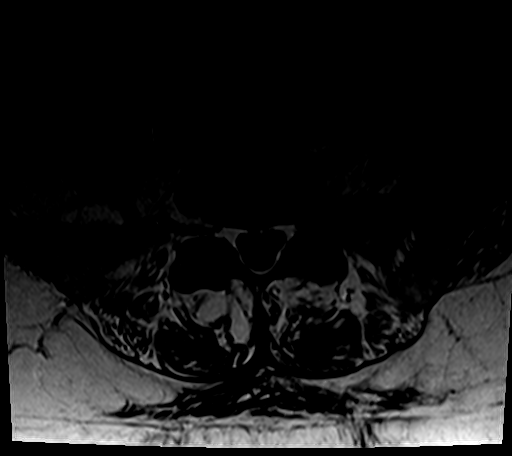
[im 11/32]
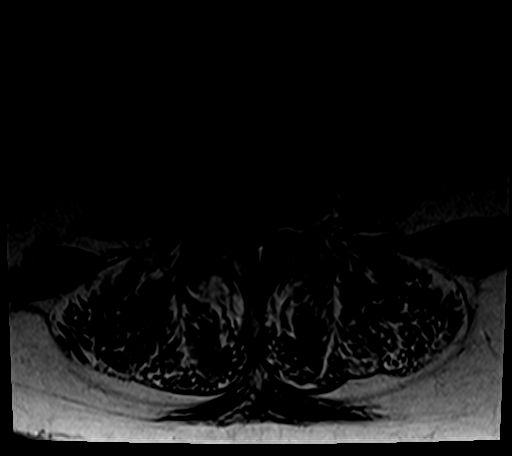
[im 14/32]
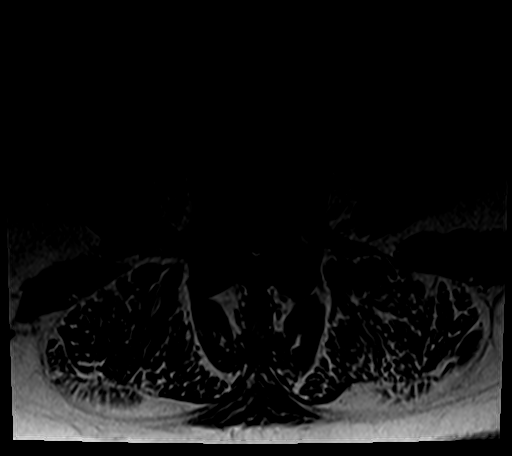
[im 18/32]
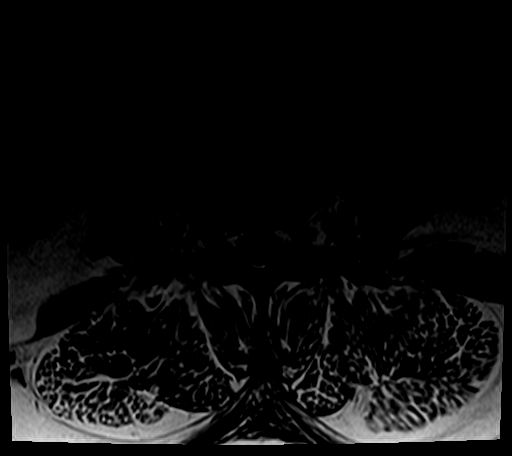
[im 21/32]
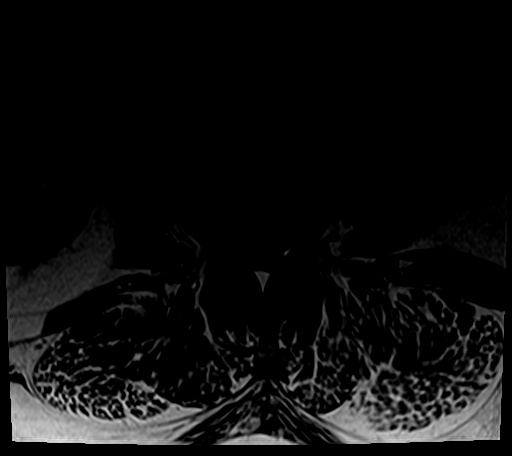
[im 28/32]
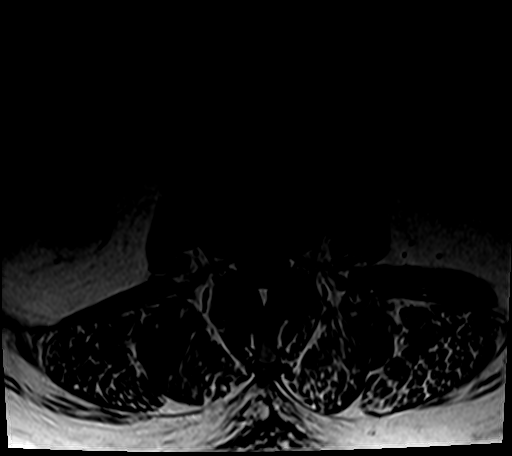
[im 32/32]
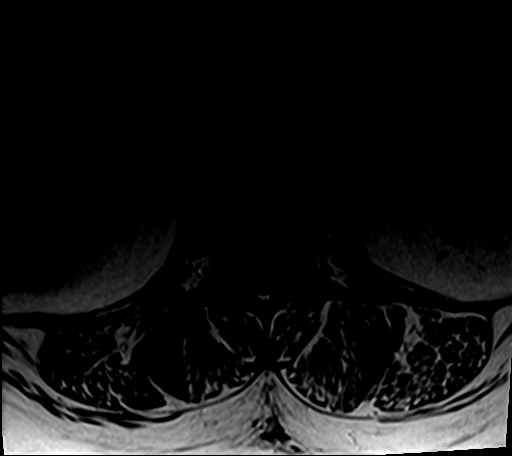

[Series 6: T2 · axial · 4.0mm · 0.70mm/px · z∈[-63,+111]mm · 8 of 32 slices shown]
[im 1/32]
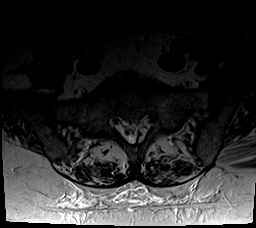
[im 4/32]
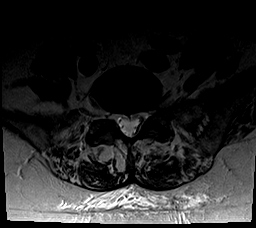
[im 11/32]
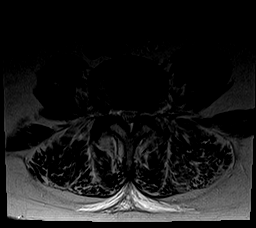
[im 14/32]
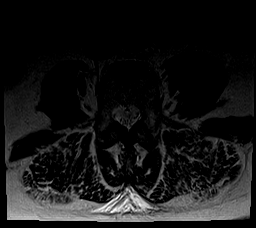
[im 18/32]
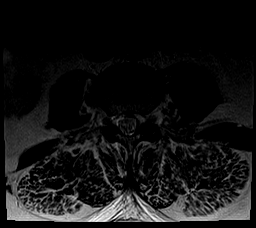
[im 21/32]
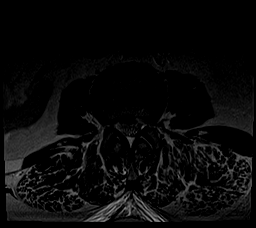
[im 28/32]
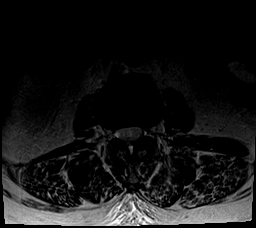
[im 32/32]
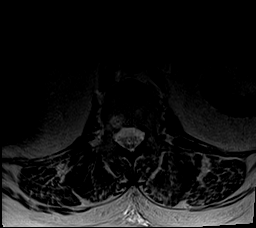

[Series 7: T2 post-contrast · sagittal · 4.0mm · 0.55mm/px · 4 of 13 slices shown]
[im 1/13]
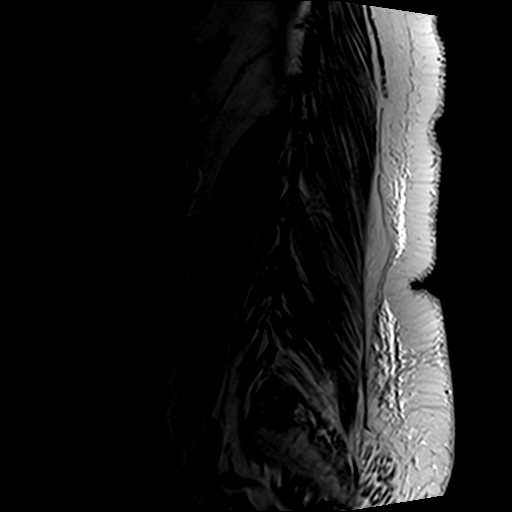
[im 5/13]
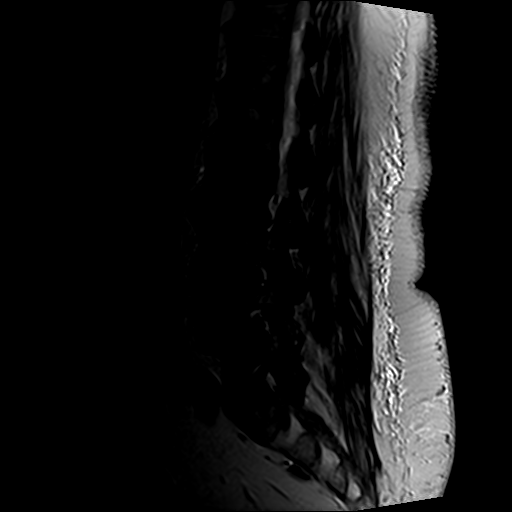
[im 9/13]
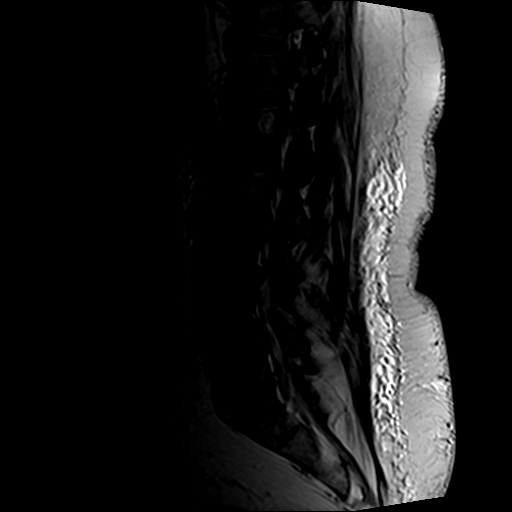
[im 13/13]
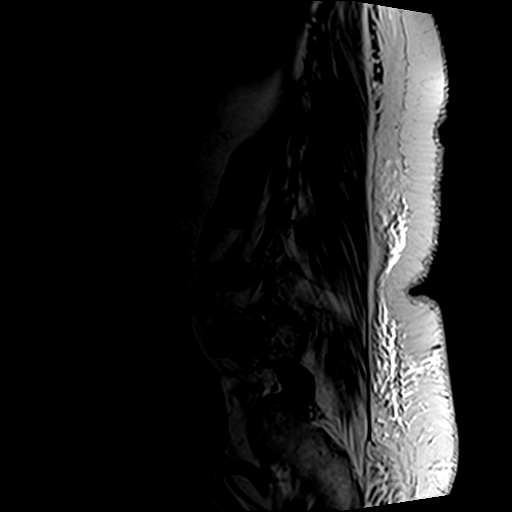

[Series 8: T1 fat-sat post-contrast · sagittal · 4.0mm · 0.88mm/px · 1 of 13 slices shown]
[im 1/13]
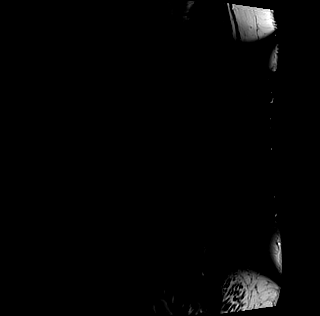

[26 of 48 positions shown; findings below may reference images not displayed]

FINDINGS: Segmentation:  Normal.

Alignment: 4.4 mm spondylolisthesis at L4-5 due to bilateral facet
arthritis.

Vertebrae:  No significant abnormalities.

Conus medullaris: Extends to the L1 level and appears normal.

Paraspinal and other soft tissues: Negative.

Disc levels:

T12-L1 and L1-2:  Negative.

L2-3: Small broad-based soft disc protrusion with slight compression
of the thecal sac without neural impingement. The protrusion is
slightly more prominent centrally than on the prior exam.

L3-4: Small broad-based soft disc protrusion asymmetric into and
lateral to the right neural foramen which could affect the right L3
nerve. This is minimally more prominent than on the prior study.
Slight hypertrophy of the facet joints and ligamentum flavum create
mild spinal stenosis.

L4-5: Severe bilateral facet arthritis with hypertrophy of the facet
joints and ligamentum flavum and grade 1 spondylolisthesis. Minimal
bulging of the uncovered disc without protrusion. Moderate bilateral
foraminal stenosis, right greater than left. Moderate spinal
stenosis, unchanged.

L5-S1: Normal disc. Progressive moderate bilateral facet arthritis,
slightly more on the left than the right. No neural impingement.

No pathologic enhancement in the lumbar spine after contrast
administration. No evidence of metastatic disease to the lumbar
spine.
IMPRESSION: Slight progression of degenerative facet arthritis in the lumbar
spine.

Slight progression of broad-based disc protrusion at L2-3 without
focal neural impingement.

## 2017-08-10 ENCOUNTER — Other Ambulatory Visit: Payer: Self-pay | Admitting: General Surgery

## 2017-08-10 DIAGNOSIS — L539 Erythematous condition, unspecified: Secondary | ICD-10-CM

## 2017-08-10 DIAGNOSIS — N644 Mastodynia: Secondary | ICD-10-CM

## 2017-08-11 ENCOUNTER — Other Ambulatory Visit: Payer: Self-pay | Admitting: General Surgery

## 2017-08-11 ENCOUNTER — Ambulatory Visit
Admission: RE | Admit: 2017-08-11 | Discharge: 2017-08-11 | Disposition: A | Discharge status: Home / Self Care | Payer: Medicare Other | Source: Ambulatory Visit | Attending: General Surgery | Admitting: General Surgery

## 2017-08-11 ENCOUNTER — Ambulatory Visit: Payer: Medicare Other

## 2017-08-11 DIAGNOSIS — L539 Erythematous condition, unspecified: Secondary | ICD-10-CM

## 2017-08-11 DIAGNOSIS — N644 Mastodynia: Secondary | ICD-10-CM

## 2017-11-08 ENCOUNTER — Other Ambulatory Visit: Payer: Self-pay | Admitting: General Surgery

## 2017-11-08 DIAGNOSIS — Z9889 Other specified postprocedural states: Secondary | ICD-10-CM

## 2017-11-22 DIAGNOSIS — H9203 Otalgia, bilateral: Secondary | ICD-10-CM

## 2017-11-22 HISTORY — DX: Otalgia, bilateral: H92.03

## 2017-12-05 ENCOUNTER — Other Ambulatory Visit: Payer: Self-pay | Admitting: General Surgery

## 2017-12-05 DIAGNOSIS — Z9889 Other specified postprocedural states: Secondary | ICD-10-CM

## 2017-12-13 NOTE — Progress Notes (Signed)
Cardiology Office Note:    Date:  12/14/2017   ID:  Donna Shaffer, Donna Shaffer January 29, 1946, MRN [id number]  PCP:  Myrlene Broker, MD  Cardiologist:  Shirlee More, MD    Referring MD: Myrlene Broker, MD    ASSESSMENT:    1. SOB (shortness of breath) on exertion   2. Essential hypertension   3. Type 2 diabetes mellitus with hyperglycemia, without long-term current use of insulin (HCC)    PLAN:    In order of problems listed above:  1. Clinical concern is shortness of breath related to heart failure either due to hypertension diabetes, sequelae of radiation therapy or complication of her treatment for psoriatic arthritis with Humira.  Evaluation echocardiogram BNP level.  If normal benefit from pulmonary evaluation PFTs high-resolution CT scan and chest x-ray also ordered. 2. Stable at target continue calcium channel blocker 3. Stable managed by her PCP   Next appointment: 6 weeks   Medication Adjustments/Labs and Tests Ordered: Current medicines are reviewed at length with the patient today.  Concerns regarding medicines are outlined above.  Orders Placed This Encounter  Procedures  . DG Chest 2 View  . Pro b natriuretic peptide (BNP)  . EKG 12-Lead  . ECHOCARDIOGRAM COMPLETE   No orders of the defined types were placed in this encounter.   Chief Complaint  Patient presents with  . Follow-up    to re establish care    History of Present Illness:    Donna Shaffer is a 72 y.o. female with a hx of hypertension, Type 2 DM  and chest pain last seen 12/09/15 by Dr Geraldo Pitter at [facility] cardiology. She had breast cancer with lumpectomy and radiation treatment in 2017. Compliance with diet, lifestyle and medications: She has noticed over the last year shortness of breath with activities ranging between ADLs and ambulating in the home no edema orthopnea.  She is concerned about her risk for heart failure with breast cancer radiation and type 2 diabetes.  She also has had  infrequent episodes of left chest tightness without physical activity.  She has no cough wheezing or underlying lung disease.  She does snore but not severe and has no known sleep apnea Past Medical History:  Diagnosis Date  . Anxiety   . Breast cancer ([place])    left  . Cancer ([place])   . Colon polyps   . Depression   . Hypertension   . Personal history of radiation therapy   . Radiation 04/09/15-05/23/15   left breast 50.4 gray, lumpectomy cavity boost 10 gray    Past Surgical History:  Procedure Laterality Date  . ABDOMINAL HYSTERECTOMY    . APPENDECTOMY    . BILATERAL OOPHORECTOMY    . BREAST LUMPECTOMY Left 02/2015  . CATARACT EXTRACTION Bilateral   . CHOLECYSTECTOMY    . RADIOACTIVE SEED GUIDED PARTIAL MASTECTOMY WITH AXILLARY SENTINEL LYMPH NODE BIOPSY Left 02/28/2015   Procedure: RADIOACTIVE SEED GUIDED PARTIAL MASTECTOMY WITH AXILLARY SENTINEL LYMPH NODE BIOPSY;  Surgeon: Autumn Messing III, MD;  Location: [facility];  Service: General;  Laterality: Left;    Current Medications: Current Meds  Medication Sig  . acetaminophen (TYLENOL 8 HOUR ARTHRITIS PAIN) 650 MG CR tablet Take 1,300 mg by mouth every 8 (eight) hours as needed for pain.  . Adalimumab 40 MG/0.4ML PSKT Inject 40 mg into the skin every 14 (fourteen) days.  Marland Kitchen albuterol (PROVENTIL HFA;VENTOLIN HFA) 108 (90 Base) MCG/ACT inhaler Inhale 2 puffs into the lungs every  4 (four) hours as needed.  Marland Kitchen amLODipine (NORVASC) 5 MG tablet Take 5 mg by mouth daily.  . budesonide-formoterol (SYMBICORT) 160-4.5 MCG/ACT inhaler Inhale 2 puffs into the lungs 2 (two) times daily.  . calcium carbonate (CALCIUM 600) 600 MG TABS tablet Take 1 tablet by mouth daily.  . cholecalciferol (VITAMIN D) 1000 units tablet Take 1 tablet (1,000 Units total) by mouth daily.  . citalopram (CELEXA) 40 MG tablet Take 40 mg by mouth daily.  . irbesartan-hydrochlorothiazide (AVALIDE) 300-12.5 MG tablet Take 1 tablet by mouth daily.  .  pantoprazole (PROTONIX) 40 MG tablet Take 40 mg by mouth daily. Reported on 07/03/2015  . triamcinolone cream (KENALOG) 0.5 % Apply topically 2 (two) times daily.     Allergies:   Sulfa antibiotics   Social History   Socioeconomic History  . Marital status: Single    Spouse name: Not on file  . Number of children: 2  . Years of education: Not on file  . Highest education level: Not on file  Occupational History  . Occupation: Proofreader  Social Needs  . Financial resource strain: Not on file  . Food insecurity:    Worry: Not on file    Inability: Not on file  . Transportation needs:    Medical: Not on file    Non-medical: Not on file  Tobacco Use  . Smoking status: Never Smoker  . Smokeless tobacco: Never Used  Substance and Sexual Activity  . Alcohol use: No  . Drug use: No  . Sexual activity: Not on file  Lifestyle  . Physical activity:    Days per week: Not on file    Minutes per session: Not on file  . Stress: Not on file  Relationships  . Social connections:    Talks on phone: Not on file    Gets together: Not on file    Attends religious service: Not on file    Active member of club or organization: Not on file    Attends meetings of clubs or organizations: Not on file    Relationship status: Not on file  Other Topics Concern  . Not on file  Social History Narrative  . Not on file     Family History: The patient's family history includes Melanoma in her mother; Skin cancer in her father. ROS:   Please see the history of present illness.    All other systems reviewed and are negative.  EKGs/Labs/Other Studies Reviewed:    The following studies were reviewed today:  EKG:  EKG ordered today.  The ekg ordered today demonstrates sinus rhythm normal  Recent Labs: 11/24/2016 CMP is normal minimal elevation of AST ALT cholesterol is 160 LDL 91 HDL 55 No results found for requested labs within last 8760 hours.  Recent Lipid Panel No results found  for: CHOL, TRIG, HDL, CHOLHDL, VLDL, LDLCALC, LDLDIRECT  Physical Exam:    VS:  BP 114/68 (BP Location: Right Arm, Patient Position: Sitting, Cuff Size: Large)   Pulse 85   Ht 5\' 2"  (1.575 m)   Wt 278 lb 12.8 oz (126.5 kg)   SpO2 96%   BMI 50.99 kg/m     Wt Readings from Last 3 Encounters:  12/14/17 278 lb 12.8 oz (126.5 kg)  08/26/15 269 lb 14.4 oz (122.4 kg)  07/17/15 266 lb 11.2 oz (121 kg)     GEN: She is quite obese BMI greater than 50 well nourished, well developed in no acute distress HEENT: Normal NECK:  No JVD; No carotid bruits LYMPHATICS: No lymphadenopathy CARDIAC: RRR, no murmurs, rubs, gallops RESPIRATORY:  Clear to auscultation without rales, wheezing or rhonchi  ABDOMEN: Soft, non-tender, non-distended MUSCULOSKELETAL:  No edema; No deformity  SKIN: Warm and dry NEUROLOGIC:  Alert and oriented x 3 PSYCHIATRIC:  Normal affect    Signed, Shirlee More, MD  12/14/2017 5:52 PM    [facility]

## 2017-12-14 ENCOUNTER — Encounter: Payer: Self-pay | Admitting: *Deleted

## 2017-12-14 ENCOUNTER — Encounter: Payer: Self-pay | Admitting: Cardiology

## 2017-12-14 ENCOUNTER — Ambulatory Visit (INDEPENDENT_AMBULATORY_CARE_PROVIDER_SITE_OTHER): Payer: Medicare Other | Admitting: Cardiology

## 2017-12-14 VITALS — BP 114/68 | HR 85 | Ht 62.0 in | Wt 278.8 lb

## 2017-12-14 DIAGNOSIS — I1310 Hypertensive heart and chronic kidney disease without heart failure, with stage 1 through stage 4 chronic kidney disease, or unspecified chronic kidney disease: Secondary | ICD-10-CM

## 2017-12-14 DIAGNOSIS — R0602 Shortness of breath: Secondary | ICD-10-CM

## 2017-12-14 DIAGNOSIS — R0609 Other forms of dyspnea: Secondary | ICD-10-CM

## 2017-12-14 DIAGNOSIS — I119 Hypertensive heart disease without heart failure: Secondary | ICD-10-CM

## 2017-12-14 DIAGNOSIS — R079 Chest pain, unspecified: Secondary | ICD-10-CM | POA: Insufficient documentation

## 2017-12-14 DIAGNOSIS — R0600 Dyspnea, unspecified: Secondary | ICD-10-CM

## 2017-12-14 DIAGNOSIS — I10 Essential (primary) hypertension: Secondary | ICD-10-CM

## 2017-12-14 DIAGNOSIS — I130 Hypertensive heart and chronic kidney disease with heart failure and stage 1 through stage 4 chronic kidney disease, or unspecified chronic kidney disease: Secondary | ICD-10-CM

## 2017-12-14 DIAGNOSIS — E1165 Type 2 diabetes mellitus with hyperglycemia: Secondary | ICD-10-CM | POA: Diagnosis not present

## 2017-12-14 HISTORY — DX: Hypertensive heart and chronic kidney disease without heart failure, with stage 1 through stage 4 chronic kidney disease, or unspecified chronic kidney disease: I13.10

## 2017-12-14 HISTORY — DX: Other forms of dyspnea: R06.09

## 2017-12-14 HISTORY — DX: Hypertensive heart and chronic kidney disease with heart failure and stage 1 through stage 4 chronic kidney disease, or unspecified chronic kidney disease: I13.0

## 2017-12-14 HISTORY — DX: Chest pain, unspecified: R07.9

## 2017-12-14 HISTORY — DX: Dyspnea, unspecified: R06.00

## 2017-12-14 HISTORY — DX: Shortness of breath: R06.02

## 2017-12-14 NOTE — Patient Instructions (Signed)
Medication Instructions:  Your physician recommends that you continue on your current medications as directed. Please refer to the Current Medication list given to you today.   Labwork: Your physician recommends that you return for lab work today: [place].   Testing/Procedures: You had an EKG today.   Your physician has requested that you have an echocardiogram. Echocardiography is a painless test that uses sound waves to create images of your heart. It provides your doctor with information about the size and shape of your heart and how well your heart's chambers and valves are working. This procedure takes approximately one hour. There are no restrictions for this procedure.  A chest x-ray takes a picture of the organs and structures inside the chest, including the heart, lungs, and blood vessels. This test can show several things, including, whether the heart is enlarges; whether fluid is building up in the lungs; and whether pacemaker / defibrillator leads are still in place.  Follow-Up: Your physician recommends that you schedule a follow-up appointment in: 6 weeks.   If you need a refill on your cardiac medications before your next appointment, please call your pharmacy.   Thank you for choosing [facility]! Robyne Peers, RN [phone]   Echocardiogram An echocardiogram, or echocardiography, uses sound waves (ultrasound) to produce an image of your heart. The echocardiogram is simple, painless, obtained within a short period of time, and offers valuable information to your health care provider. The images from an echocardiogram can provide information such as:  Evidence of coronary artery disease (CAD).  Heart size.  Heart muscle function.  Heart valve function.  Aneurysm detection.  Evidence of a past heart attack.  Fluid buildup around the heart.  Heart muscle thickening.  Assess heart valve function.  Tell a health care provider about:  Any allergies  you have.  All medicines you are taking, including vitamins, herbs, eye drops, creams, and over-the-counter medicines.  Any problems you or family members have had with anesthetic medicines.  Any blood disorders you have.  Any surgeries you have had.  Any medical conditions you have.  Whether you are pregnant or may be pregnant. What happens before the procedure? No special preparation is needed. Eat and drink normally. What happens during the procedure?  In order to produce an image of your heart, gel will be applied to your chest and a wand-like tool (transducer) will be moved over your chest. The gel will help transmit the sound waves from the transducer. The sound waves will harmlessly bounce off your heart to allow the heart images to be captured in real-time motion. These images will then be recorded.  You may need an IV to receive a medicine that improves the quality of the pictures. What happens after the procedure? You may return to your normal schedule including diet, activities, and medicines, unless your health care provider tells you otherwise. This information is not intended to replace advice given to you by your health care provider. Make sure you discuss any questions you have with your health care provider. Document Released: 03/05/2000 Document Revised: 10/25/2015 Document Reviewed: 11/13/2012 Elsevier Interactive Patient Education  2017 [employer].    Chest X-Ray A chest X-ray is a painless test that uses radiation to create images of the structures inside of your chest. Chest X-rays are used to look for many health conditions, including heart failure, pneumonia, tuberculosis, rib fractures, breathing disorders, and cancer. They may be used to diagnose chest pain, constant coughing, or trouble breathing. Tell a  health care provider about:  Any allergies you have.  All medicines you are taking, including vitamins, herbs, eye drops, creams, and over-the-counter  medicines.  Any surgeries you have had.  Any medical conditions you have.  Whether you are pregnant or may be pregnant. What are the risks? Getting a chest X-ray is a safe procedure. However, you will be exposed to a small amount of radiation. Being exposed to too much radiation over a lifetime can increase the risk of cancer. This risk is small, but it may occur if you have many X-rays throughout your life. What happens before the procedure?  You may be asked to remove glasses, jewelry, and any other metal objects.  You will be asked to undress from the waist up. You may be given a hospital gown to wear.  You may be asked to wear a protective lead apron to protect parts of your body from radiation. What happens during the procedure?  You will be asked to stand still as each picture is taken to get the best possible images.  You will be asked to take a deep breath and hold your breath for a few seconds.  The X-ray machine will create a picture of your chest using a tiny burst of radiation. This is painless.  More pictures may be taken from other angles. Typically, one picture will be taken while you face the X-ray camera, and another picture will be taken from the side while you stand. If you cannot stand, you may be asked to lie down. The procedure may vary among health care providers and hospitals. What happens after the procedure?  The X-ray(s) will be reviewed by your health care provider or an X-ray (radiology) specialist.  It is up to you to get your test results. Ask your health care provider, or the department that is doing the test, when your results will be ready.  Your health care provider will tell you if you need more tests or a follow-up exam. Keep all follow-up visits as told by your health care provider. This is important. Summary  A chest X-ray is a safe, painless test that is used to examine the inside of the chest, heart, and lungs.  You will need to undress  from the waist up and remove jewelry and metal objects before the procedure.  You will be exposed to a small amount of radiation during the procedure.  The X-ray machine will take one or more pictures of your chest while you remain as still as possible.  Later, a health care provider or specialist will review the test results with you. This information is not intended to replace advice given to you by your health care provider. Make sure you discuss any questions you have with your health care provider. Document Released: 05/04/2016 Document Revised: 05/04/2016 Document Reviewed: 05/04/2016 Elsevier Interactive Patient Education  2018 [employer].   Natriuretic Peptides Test Why am I having this test? These are tests that help to diagnose the presence of heart failure and determine how severe it is. They may be done if you are being treated for heart failure. They may be done if you have symptoms of heart failure including:  Shortness of breath.  Fatigue.  There are three main natriuretic peptides (NPs):  ANP. This is produced by the part of your heart that receives blood from the body (atrium).  BNP. This is produced by your heart's main pumping chamber (left ventricle).  CNP. This is produced by the cells  that line your blood vessels (endothelial cells).  In particular, BNP levels may help your health care provider:  To diagnose heart abnormalities and heart failure.  To monitor disease progression and treatment.  What kind of sample is taken? A blood sample is required for this test. It is usually collected by inserting a needle into a vein. How do I prepare for this test? There is no preparation required for this test. What are the reference values? Reference values are considered healthy values established after testing a large group of healthy people. Reference values may vary among different people, labs, and hospitals. It is your responsibility to obtain your test  results. Ask the lab or department performing the test when and how you will get your results. Reference values are as follows:  ANP: 22-77 pg/mL or 22-77 ng/L (SI units).  BNP: Less than 100 pg/mL or less than 100 ng/L (SI units).  CNP: These values are yet to be determined.  What do the results mean? Results that are higher than the reference values may indicate:  Heart failure.  Heart attack.  High blood pressure (hypertension).  Heart transplant rejection.  Talk with your health care provider to discuss your results, treatment options, and if necessary, the need for more tests. Talk with your health care provider if you have any questions about your results. Talk with your health care provider to discuss your results, treatment options, and if necessary, the need for more tests. Talk with your health care provider if you have any questions about your results. This information is not intended to replace advice given to you by your health care provider. Make sure you discuss any questions you have with your health care provider. Document Released: 04/10/2004 Document Revised: 11/12/2015 Document Reviewed: 08/27/2013 Elsevier Interactive Patient Education  2018 [employer].

## 2017-12-15 LAB — PRO B NATRIURETIC PEPTIDE: NT-Pro BNP: 185 pg/mL (ref 0–301)

## 2017-12-20 ENCOUNTER — Other Ambulatory Visit: Payer: Self-pay

## 2017-12-20 DIAGNOSIS — R9389 Abnormal findings on diagnostic imaging of other specified body structures: Secondary | ICD-10-CM

## 2017-12-20 DIAGNOSIS — R079 Chest pain, unspecified: Secondary | ICD-10-CM

## 2017-12-20 HISTORY — DX: Abnormal findings on diagnostic imaging of other specified body structures: R93.89

## 2017-12-23 ENCOUNTER — Encounter: Payer: Self-pay | Admitting: Cardiology

## 2017-12-28 DIAGNOSIS — E559 Vitamin D deficiency, unspecified: Secondary | ICD-10-CM

## 2017-12-28 DIAGNOSIS — J3089 Other allergic rhinitis: Secondary | ICD-10-CM

## 2017-12-28 DIAGNOSIS — Z0000 Encounter for general adult medical examination without abnormal findings: Secondary | ICD-10-CM

## 2017-12-28 DIAGNOSIS — J4541 Moderate persistent asthma with (acute) exacerbation: Secondary | ICD-10-CM

## 2017-12-28 HISTORY — DX: Encounter for general adult medical examination without abnormal findings: Z00.00

## 2017-12-28 HISTORY — DX: Vitamin D deficiency, unspecified: E55.9

## 2017-12-28 HISTORY — DX: Moderate persistent asthma with (acute) exacerbation: J45.41

## 2017-12-28 HISTORY — DX: Other allergic rhinitis: J30.89

## 2018-01-03 ENCOUNTER — Other Ambulatory Visit: Payer: Self-pay | Admitting: General Surgery

## 2018-01-03 DIAGNOSIS — Z9889 Other specified postprocedural states: Secondary | ICD-10-CM

## 2018-01-03 DIAGNOSIS — N6310 Unspecified lump in the right breast, unspecified quadrant: Secondary | ICD-10-CM

## 2018-01-05 ENCOUNTER — Ambulatory Visit
Admission: RE | Admit: 2018-01-05 | Discharge: 2018-01-05 | Disposition: A | Discharge status: Home / Self Care | Payer: Medicare Other | Source: Ambulatory Visit | Attending: General Surgery | Admitting: General Surgery

## 2018-01-05 ENCOUNTER — Other Ambulatory Visit: Payer: Self-pay | Admitting: General Surgery

## 2018-01-05 DIAGNOSIS — Z9889 Other specified postprocedural states: Secondary | ICD-10-CM

## 2018-01-05 DIAGNOSIS — N6310 Unspecified lump in the right breast, unspecified quadrant: Secondary | ICD-10-CM

## 2018-01-26 ENCOUNTER — Ambulatory Visit (INDEPENDENT_AMBULATORY_CARE_PROVIDER_SITE_OTHER): Payer: Medicare Other

## 2018-01-26 DIAGNOSIS — I10 Essential (primary) hypertension: Secondary | ICD-10-CM

## 2018-01-26 DIAGNOSIS — R0602 Shortness of breath: Secondary | ICD-10-CM | POA: Diagnosis not present

## 2018-01-26 NOTE — Progress Notes (Signed)
Complete echocardiogram has been performed.  Jimmy Jarmaine Ehrler RDCS, RVT 

## 2018-01-30 ENCOUNTER — Ambulatory Visit (INDEPENDENT_AMBULATORY_CARE_PROVIDER_SITE_OTHER): Payer: Medicare Other | Admitting: Cardiology

## 2018-01-30 VITALS — BP 134/66 | HR 81 | Ht 62.0 in | Wt 284.0 lb

## 2018-01-30 DIAGNOSIS — I10 Essential (primary) hypertension: Secondary | ICD-10-CM

## 2018-01-30 DIAGNOSIS — R0602 Shortness of breath: Secondary | ICD-10-CM

## 2018-01-30 MED ORDER — FUROSEMIDE 20 MG PO TABS: 20.0000 mg | ORAL_TABLET | Freq: Every day | ORAL | 3 refills | Status: DC

## 2018-01-30 NOTE — Patient Instructions (Signed)
Medication Instructions:  Your physician has recommended you make the following change in your medication:   STOP amlodipine (norvasc)   START furosemide (lasix) 20 mg: Take 1 tablet daily   If you need a refill on your cardiac medications before your next appointment, please call your pharmacy.   Lab work: None  If you have labs (blood work) drawn today and your tests are completely normal, you will receive your results only by: Marland Kitchen MyChart Message (if you have MyChart) OR . A paper copy in the mail If you have any lab test that is abnormal or we need to change your treatment, we will call you to review the results.  Testing/Procedures: None  Follow-Up: At [facility], you and your health needs are our priority.  As part of our continuing mission to provide you with exceptional heart care, we have created designated Provider Care Teams.  These Care Teams include your primary Cardiologist (physician) and Advanced Practice Providers (APPs -  Physician Assistants and Nurse Practitioners) who all work together to provide you with the care you need, when you need it. You will need a follow up appointment in 3 weeks.       Furosemide tablets What is this medicine? FUROSEMIDE (fyoor [place] se mide) is a diuretic. It helps you make more urine and to lose salt and excess water from your body. This medicine is used to treat high blood pressure, and edema or swelling from heart, kidney, or liver disease. This medicine may be used for other purposes; ask your health care provider or pharmacist if you have questions. COMMON BRAND NAME(S): Active-Medicated Specimen Kit, Delone, Diuscreen, Lasix, RX Specimen Collection Kit, Specimen Collection Kit, URINX Medicated Specimen Collection What should I tell my health care provider before I take this medicine? They need to know if you have any of these conditions: -abnormal blood electrolytes -diarrhea or vomiting -gout -heart disease -kidney disease,  small amounts of urine, or difficulty passing urine -liver disease -thyroid disease -an unusual or allergic reaction to furosemide, sulfa drugs, other medicines, foods, dyes, or preservatives -pregnant or trying to get pregnant -breast-feeding How should I use this medicine? Take this medicine by mouth with a glass of water. Follow the directions on the prescription label. You may take this medicine with or without food. If it upsets your stomach, take it with food or milk. Do not take your medicine more often than directed. Remember that you will need to pass more urine after taking this medicine. Do not take your medicine at a time of day that will cause you problems. Do not take at bedtime. Talk to your pediatrician regarding the use of this medicine in children. While this drug may be prescribed for selected conditions, precautions do apply. Overdosage: If you think you have taken too much of this medicine contact a poison control center or emergency room at once. NOTE: This medicine is only for you. Do not share this medicine with others. What if I miss a dose? If you miss a dose, take it as soon as you can. If it is almost time for your next dose, take only that dose. Do not take double or extra doses. What may interact with this medicine? -aspirin and aspirin-like medicines -certain antibiotics -chloral hydrate -cisplatin -cyclosporine -digoxin -diuretics -laxatives -lithium -medicines for blood pressure -medicines that relax muscles for surgery -methotrexate -NSAIDs, medicines for pain and inflammation like ibuprofen, naproxen, or indomethacin -phenytoin -steroid medicines like prednisone or cortisone -sucralfate -thyroid hormones This  list may not describe all possible interactions. Give your health care provider a list of all the medicines, herbs, non-prescription drugs, or dietary supplements you use. Also tell them if you smoke, drink alcohol, or use illegal drugs. Some  items may interact with your medicine. What should I watch for while using this medicine? Visit your doctor or health care professional for regular checks on your progress. Check your blood pressure regularly. Ask your doctor or health care professional what your blood pressure should be, and when you should contact him or her. If you are a diabetic, check your blood sugar as directed. You may need to be on a special diet while taking this medicine. Check with your doctor. Also, ask how many glasses of fluid you need to drink a day. You must not get dehydrated. You may get drowsy or dizzy. Do not drive, use machinery, or do anything that needs mental alertness until you know how this drug affects you. Do not stand or sit up quickly, especially if you are an older patient. This reduces the risk of dizzy or fainting spells. Alcohol can make you more drowsy and dizzy. Avoid alcoholic drinks. This medicine can make you more sensitive to the sun. Keep out of the sun. If you cannot avoid being in the sun, wear protective clothing and use sunscreen. Do not use sun lamps or tanning beds/booths. What side effects may I notice from receiving this medicine? Side effects that you should report to your doctor or health care professional as soon as possible: -blood in urine or stools -dry mouth -fever or chills -hearing loss or ringing in the ears -irregular heartbeat -muscle pain or weakness, cramps -skin rash -stomach upset, pain, or nausea -tingling or numbness in the hands or feet -unusually weak or tired -vomiting or diarrhea -yellowing of the eyes or skin Side effects that usually do not require medical attention (report to your doctor or health care professional if they continue or are bothersome): -headache -loss of appetite -unusual bleeding or bruising This list may not describe all possible side effects. Call your doctor for medical advice about side effects. You may report side effects to FDA  at 1-800-FDA-1088. Where should I keep my medicine? Keep out of the reach of children. Store at room temperature between 15 and 30 degrees C (59 and 86 degrees F). Protect from light. Throw away any unused medicine after the expiration date. NOTE: This sheet is a summary. It may not cover all possible information. If you have questions about this medicine, talk to your doctor, pharmacist, or health care provider.  2018 Elsevier/Gold Standard (2014-05-29 13:49:50)

## 2018-01-30 NOTE — Progress Notes (Signed)
Cardiology Office Note:    Date:  01/30/2018   ID:  Donna, Shaffer [birth year], MRN [id number]  PCP:  Myrlene Broker, MD  Cardiologist:  Shirlee More, MD    Referring MD: Myrlene Broker, MD    ASSESSMENT:    1. SOB (shortness of breath) on exertion   2. Essential hypertension    PLAN:    In order of problems listed above:  1. Clinically I feel she has heart failure diastolic she has a normal proBNP level but is edematous short of breath and her diastolic filling indices are consistent with elevated pressure.  We will place her on a loop diuretic as a trial furosemide 20 mg daily and stop her calcium channel blocker which is potentiating edema.  Her CT of the chest showed a nodule small otherwise unremarkable.  If unimproved she will need an ischemia evaluation. 2. Discontinue calcium channel blocker and loop diuretic   Next appointment: 2 to 3 weeks   Medication Adjustments/Labs and Tests Ordered: Current medicines are reviewed at length with the patient today.  Concerns regarding medicines are outlined above.  No orders of the defined types were placed in this encounter.  No orders of the defined types were placed in this encounter.   Chief Complaint  Patient presents with  . Follow-up  . Shortness of Breath    History of Present Illness:    Donna Shaffer is a 72 y.o. female with a hx of hypertension, Type 2 DM  and chest pain last seen 12/09/15 by Dr Geraldo Pitter at [facility] cardiology. She had breast cancer with lumpectomy and radiation treatment in 2017.  She was last seen 12/14/17 for SOB.   Echo 01/26/18: - 1. Normal systolic function.  LV E/e&', average  19.57             2. Impaired LV relaxation.   3. Small pericardial effusion versus fat pad.   Ref Range & Units 19mo ago  NT-Pro BNP 0 - 301 pg/mL 185      Compliance with diet, lifestyle and medications: Yes  She continues to be breathless with more than minor activities and had one  episode walking a long distance where she had substernal tightness with shortness of breath relieved with rest she is not having a typical pattern of angina I think she is poorly suited for myocardial perfusion imaging and if she is unimproved with treatment for diastolic heart failure should need cardiac CTA.  She continues to have edema orthopnea. Past Medical History:  Diagnosis Date  . Anxiety   . Breast cancer ([place])    left  . Cancer ([place])   . Colon polyps   . Depression   . Hypertension   . Personal history of radiation therapy   . Radiation 04/09/15-05/23/15   left breast 50.4 gray, lumpectomy cavity boost 10 gray    Past Surgical History:  Procedure Laterality Date  . ABDOMINAL HYSTERECTOMY    . APPENDECTOMY    . BILATERAL OOPHORECTOMY    . BREAST LUMPECTOMY Left 02/2015  . CATARACT EXTRACTION Bilateral   . CHOLECYSTECTOMY    . RADIOACTIVE SEED GUIDED PARTIAL MASTECTOMY WITH AXILLARY SENTINEL LYMPH NODE BIOPSY Left 02/28/2015   Procedure: RADIOACTIVE SEED GUIDED PARTIAL MASTECTOMY WITH AXILLARY SENTINEL LYMPH NODE BIOPSY;  Surgeon: Autumn Messing III, MD;  Location: [place];  Service: General;  Laterality: Left;    Current Medications: Current Meds  Medication Sig  . acetaminophen (TYLENOL 8 HOUR ARTHRITIS  PAIN) 650 MG CR tablet Take 1,300 mg by mouth every 8 (eight) hours as needed for pain.  . Adalimumab 40 MG/0.4ML PSKT Inject 40 mg into the skin every 14 (fourteen) days.  Marland Kitchen albuterol (PROVENTIL HFA;VENTOLIN HFA) 108 (90 Base) MCG/ACT inhaler Inhale 2 puffs into the lungs every 4 (four) hours as needed.  Marland Kitchen amLODipine (NORVASC) 5 MG tablet Take 5 mg by mouth daily.  . budesonide-formoterol (SYMBICORT) 160-4.5 MCG/ACT inhaler Inhale 2 puffs into the lungs 2 (two) times daily.  . calcium carbonate (CALCIUM 600) 600 MG TABS tablet Take 1 tablet by mouth daily.  . cholecalciferol (VITAMIN D) 1000 units tablet Take 1 tablet (1,000 Units total) by mouth daily.  .  citalopram (CELEXA) 40 MG tablet Take 40 mg by mouth daily.  . fluticasone (FLONASE) 50 MCG/ACT nasal spray Place 1 spray into the nose daily.  . hydrOXYzine (ATARAX/VISTARIL) 25 MG tablet Take 25 mg by mouth daily as needed.   . irbesartan-hydrochlorothiazide (AVALIDE) 300-12.5 MG tablet Take 1 tablet by mouth daily.  . montelukast (SINGULAIR) 10 MG tablet Take 10 mg by mouth at bedtime.  . pantoprazole (PROTONIX) 40 MG tablet Take 40 mg by mouth daily. Reported on 07/03/2015  . triamcinolone cream (KENALOG) 0.5 % Apply topically 2 (two) times daily.     Allergies:   Sulfa antibiotics   Social History   Socioeconomic History  . Marital status: Single    Spouse name: Not on file  . Number of children: 2  . Years of education: Not on file  . Highest education level: Not on file  Occupational History  . Occupation: Proofreader  Social Needs  . Financial resource strain: Not on file  . Food insecurity:    Worry: Not on file    Inability: Not on file  . Transportation needs:    Medical: Not on file    Non-medical: Not on file  Tobacco Use  . Smoking status: Never Smoker  . Smokeless tobacco: Never Used  Substance and Sexual Activity  . Alcohol use: No  . Drug use: No  . Sexual activity: Not on file  Lifestyle  . Physical activity:    Days per week: Not on file    Minutes per session: Not on file  . Stress: Not on file  Relationships  . Social connections:    Talks on phone: Not on file    Gets together: Not on file    Attends religious service: Not on file    Active member of club or organization: Not on file    Attends meetings of clubs or organizations: Not on file    Relationship status: Not on file  Other Topics Concern  . Not on file  Social History Narrative  . Not on file     Family History: The patient's family history includes Melanoma in her mother; Skin cancer in her father. ROS:   Please see the history of present illness.    All other systems  reviewed and are negative.  EKGs/Labs/Other Studies Reviewed:    The following studies were reviewed today:    Recent Labs: 12/14/2017: NT-Pro BNP 185  Recent Lipid Panel No results found for: CHOL, TRIG, HDL, CHOLHDL, VLDL, LDLCALC, LDLDIRECT  Physical Exam:    VS:  BP 134/66 (BP Location: Right Arm, Patient Position: Sitting, Cuff Size: Large)   Pulse 81   Ht 5\' 2"  (1.575 m)   Wt 284 lb (128.8 kg)   SpO2 96%   BMI  51.94 kg/m     Wt Readings from Last 3 Encounters:  01/30/18 284 lb (128.8 kg)  12/14/17 278 lb 12.8 oz (126.5 kg)  08/26/15 269 lb 14.4 oz (122.4 kg)     GEN:  Well nourished, well developed in no acute distress HEENT: Normal NECK: No JVD; No carotid bruits LYMPHATICS: No lymphadenopathy CARDIAC: RRR, no murmurs, rubs, gallops RESPIRATORY:  Clear to auscultation without rales, wheezing or rhonchi  ABDOMEN: Soft, non-tender, non-distended MUSCULOSKELETAL:  4+ bilateral to the knee edema; No deformity  SKIN: Warm and dry NEUROLOGIC:  Alert and oriented x 3 PSYCHIATRIC:  Normal affect    Signed, Shirlee More, MD  01/30/2018 1:40 PM     [facility]

## 2018-02-21 NOTE — Progress Notes (Deleted)
Cardiology Office Note:    Date:  02/21/2018   ID:  Donna Shaffer, Donna Shaffer [birth year], MRN [id number]  PCP:  Myrlene Broker, MD  Cardiologist:  Shirlee More, MD    Referring MD: Myrlene Broker, MD    ASSESSMENT:    No diagnosis found. PLAN:    In order of problems listed above:  1. ***   Next appointment: ***   Medication Adjustments/Labs and Tests Ordered: Current medicines are reviewed at length with the patient today.  Concerns regarding medicines are outlined above.  No orders of the defined types were placed in this encounter.  No orders of the defined types were placed in this encounter.   No chief complaint on file.   History of Present Illness:    Donna Shaffer is a 72 y.o. female with a hx of hypertension, Type 2 DM  and chest pain last seen 12/09/15 by Dr Geraldo Pitter at [facility] cardiology. She had breast cancer with lumpectomy and radiation treatment in 2017 62/69/48 with diastolic heart failure last seen. Compliance with diet, lifestyle and medications: *** Past Medical History:  Diagnosis Date  . Anxiety   . Breast cancer ([place])    left  . Cancer ([place])   . Colon polyps   . Depression   . Hypertension   . Personal history of radiation therapy   . Radiation 04/09/15-05/23/15   left breast 50.4 gray, lumpectomy cavity boost 10 gray    Past Surgical History:  Procedure Laterality Date  . ABDOMINAL HYSTERECTOMY    . APPENDECTOMY    . BILATERAL OOPHORECTOMY    . BREAST LUMPECTOMY Left 02/2015  . CATARACT EXTRACTION Bilateral   . CHOLECYSTECTOMY    . RADIOACTIVE SEED GUIDED PARTIAL MASTECTOMY WITH AXILLARY SENTINEL LYMPH NODE BIOPSY Left 02/28/2015   Procedure: RADIOACTIVE SEED GUIDED PARTIAL MASTECTOMY WITH AXILLARY SENTINEL LYMPH NODE BIOPSY;  Surgeon: Autumn Messing III, MD;  Location: [place];  Service: General;  Laterality: Left;    Current Medications: No outpatient medications have been marked as taking for the 02/22/18  encounter (Appointment) with Richardo Priest, MD.     Allergies:   Sulfa antibiotics   Social History   Socioeconomic History  . Marital status: Single    Spouse name: Not on file  . Number of children: 2  . Years of education: Not on file  . Highest education level: Not on file  Occupational History  . Occupation: Proofreader  Social Needs  . Financial resource strain: Not on file  . Food insecurity:    Worry: Not on file    Inability: Not on file  . Transportation needs:    Medical: Not on file    Non-medical: Not on file  Tobacco Use  . Smoking status: Never Smoker  . Smokeless tobacco: Never Used  Substance and Sexual Activity  . Alcohol use: No  . Drug use: No  . Sexual activity: Not on file  Lifestyle  . Physical activity:    Days per week: Not on file    Minutes per session: Not on file  . Stress: Not on file  Relationships  . Social connections:    Talks on phone: Not on file    Gets together: Not on file    Attends religious service: Not on file    Active member of club or organization: Not on file    Attends meetings of clubs or organizations: Not on file    Relationship status: Not on  file  Other Topics Concern  . Not on file  Social History Narrative  . Not on file     Family History: The patient's ***family history includes Melanoma in her mother; Skin cancer in her father. ROS:   Please see the history of present illness.    All other systems reviewed and are negative.  EKGs/Labs/Other Studies Reviewed:    The following studies were reviewed today:  EKG:  EKG ordered today.  The ekg ordered today demonstrates ***  Recent Labs: 12/14/2017: NT-Pro BNP 185  Recent Lipid Panel No results found for: CHOL, TRIG, HDL, CHOLHDL, VLDL, LDLCALC, LDLDIRECT  Physical Exam:    VS:  There were no vitals taken for this visit.    Wt Readings from Last 3 Encounters:  01/30/18 284 lb (128.8 kg)  12/14/17 278 lb 12.8 oz (126.5 kg)  08/26/15 269  lb 14.4 oz (122.4 kg)     GEN: *** Well nourished, well developed in no acute distress HEENT: Normal NECK: No JVD; No carotid bruits LYMPHATICS: No lymphadenopathy CARDIAC: ***RRR, no murmurs, rubs, gallops RESPIRATORY:  Clear to auscultation without rales, wheezing or rhonchi  ABDOMEN: Soft, non-tender, non-distended MUSCULOSKELETAL:  No edema; No deformity  SKIN: Warm and dry NEUROLOGIC:  Alert and oriented x 3 PSYCHIATRIC:  Normal affect    Signed, Shirlee More, MD  02/21/2018 5:41 PM    [place] [facility]

## 2018-02-22 ENCOUNTER — Ambulatory Visit: Payer: Medicare Other | Admitting: Cardiology

## 2018-03-18 DIAGNOSIS — J189 Pneumonia, unspecified organism: Secondary | ICD-10-CM

## 2018-03-18 HISTORY — DX: Pneumonia, unspecified organism: J18.9

## 2018-03-20 DIAGNOSIS — J40 Bronchitis, not specified as acute or chronic: Secondary | ICD-10-CM

## 2018-03-20 HISTORY — DX: Bronchitis, not specified as acute or chronic: J40

## 2018-04-07 ENCOUNTER — Ambulatory Visit: Payer: Medicare Other | Admitting: Cardiology

## 2018-04-07 NOTE — Progress Notes (Deleted)
Cardiology Office Note:    Date:  04/07/2018   ID:  Donna, Shaffer 11/19/1945, MRN [id number]  PCP:  Myrlene Broker, MD  Cardiologist:  Shirlee More, MD    Referring MD: Myrlene Broker, MD    ASSESSMENT:    No diagnosis found. PLAN:    In order of problems listed above:  1. ***   Next appointment: ***   Medication Adjustments/Labs and Tests Ordered: Current medicines are reviewed at length with the patient today.  Concerns regarding medicines are outlined above.  No orders of the defined types were placed in this encounter.  No orders of the defined types were placed in this encounter.   No chief complaint on file.   History of Present Illness:    Donna Shaffer is a 73 y.o. female with a hx of hypertension, Type 2 DM  and chest pain. She had breast cancer with lumpectomy and radiation treatment in 2017.   She was last seen 68/34/19 with diastolic heart failure and started on a diuretic . Compliance with diet, lifestyle and medications: *** Past Medical History:  Diagnosis Date  . Anxiety   . Breast cancer ([place])    left  . Cancer ([place])   . Colon polyps   . Depression   . Hypertension   . Personal history of radiation therapy   . Radiation 04/09/15-05/23/15   left breast 50.4 gray, lumpectomy cavity boost 10 gray    Past Surgical History:  Procedure Laterality Date  . ABDOMINAL HYSTERECTOMY    . APPENDECTOMY    . BILATERAL OOPHORECTOMY    . BREAST LUMPECTOMY Left 02/2015  . CATARACT EXTRACTION Bilateral   . CHOLECYSTECTOMY    . RADIOACTIVE SEED GUIDED PARTIAL MASTECTOMY WITH AXILLARY SENTINEL LYMPH NODE BIOPSY Left 02/28/2015   Procedure: RADIOACTIVE SEED GUIDED PARTIAL MASTECTOMY WITH AXILLARY SENTINEL LYMPH NODE BIOPSY;  Surgeon: Autumn Messing III, MD;  Location: [place];  Service: General;  Laterality: Left;    Current Medications: No outpatient medications have been marked as taking for the 04/07/18 encounter  (Appointment) with Richardo Priest, MD.     Allergies:   Sulfa antibiotics   Social History   Socioeconomic History  . Marital status: Single    Spouse name: Not on file  . Number of children: 2  . Years of education: Not on file  . Highest education level: Not on file  Occupational History  . Occupation: Proofreader  Social Needs  . Financial resource strain: Not on file  . Food insecurity:    Worry: Not on file    Inability: Not on file  . Transportation needs:    Medical: Not on file    Non-medical: Not on file  Tobacco Use  . Smoking status: Never Smoker  . Smokeless tobacco: Never Used  Substance and Sexual Activity  . Alcohol use: No  . Drug use: No  . Sexual activity: Not on file  Lifestyle  . Physical activity:    Days per week: Not on file    Minutes per session: Not on file  . Stress: Not on file  Relationships  . Social connections:    Talks on phone: Not on file    Gets together: Not on file    Attends religious service: Not on file    Active member of club or organization: Not on file    Attends meetings of clubs or organizations: Not on file    Relationship status: Not  on file  Other Topics Concern  . Not on file  Social History Narrative  . Not on file     Family History: The patient's ***family history includes Melanoma in her mother; Skin cancer in her father. ROS:   Please see the history of present illness.    All other systems reviewed and are negative.  EKGs/Labs/Other Studies Reviewed:    The following studies were reviewed today:  EKG:  EKG ordered today.  The ekg ordered today demonstrates ***  Recent Labs: 12/14/2017: NT-Pro BNP 185  Recent Lipid Panel No results found for: CHOL, TRIG, HDL, CHOLHDL, VLDL, LDLCALC, LDLDIRECT  Physical Exam:    VS:  There were no vitals taken for this visit.    Wt Readings from Last 3 Encounters:  01/30/18 284 lb (128.8 kg)  12/14/17 278 lb 12.8 oz (126.5 kg)  08/26/15 269 lb 14.4  oz (122.4 kg)     GEN: *** Well nourished, well developed in no acute distress HEENT: Normal NECK: No JVD; No carotid bruits LYMPHATICS: No lymphadenopathy CARDIAC: ***RRR, no murmurs, rubs, gallops RESPIRATORY:  Clear to auscultation without rales, wheezing or rhonchi  ABDOMEN: Soft, non-tender, non-distended MUSCULOSKELETAL:  No edema; No deformity  SKIN: Warm and dry NEUROLOGIC:  Alert and oriented x 3 PSYCHIATRIC:  Normal affect    Signed, Shirlee More, MD  04/07/2018 7:27 AM    [place] [facility]

## 2018-05-11 DIAGNOSIS — J4530 Mild persistent asthma, uncomplicated: Secondary | ICD-10-CM

## 2018-05-11 HISTORY — DX: Mild persistent asthma, uncomplicated: J45.30

## 2018-05-28 NOTE — Progress Notes (Signed)
Cardiology Office Note:    Date:  05/29/2018   ID:  Donna, Shaffer 1945/12/16, MRN [id number]  PCP:  Myrlene Broker, MD  Cardiologist:  Shirlee More, MD    Referring MD: Myrlene Broker, MD    ASSESSMENT:    1. Hypertensive heart disease with chronic diastolic congestive heart failure ([place])    PLAN:    In order of problems listed above:  1. Her blood pressure is above goal and heart failure is decompensated with fluid overload restart diuretic taking 20 mg of torsemide daily or twice daily as needed.  She has labs scheduled for later in the week from dermatology including CBC hepatic function and asked to have BMP proBNP and TSH performed.  Plan to see back in my office in 3 months or sooner if she is not quickly improved resuming her diuretic.   Next appointment: 3 months   Medication Adjustments/Labs and Tests Ordered: Current medicines are reviewed at length with the patient today.  Concerns regarding medicines are outlined above.  No orders of the defined types were placed in this encounter.  No orders of the defined types were placed in this encounter.   Chief Complaint  Patient presents with  . Follow-up  . Shortness of Breath    History of Present Illness:    Donna Shaffer is a 73 y.o. female with a hx of hypertension type 2 diabetes mellitus history of breast cancer with lumpectomy and radiation therapy in 4098 and diastolic heart failure with hypertension.  She was last seen 01/30/2018. Compliance with diet, lifestyle and medications: Yes  She is not doing well last week ran out of her diuretic her weight is up in the range of 9 pounds more short of breath and intermittent wheezing.  Overall she feels very badly she has psoriasis arthritis is being seen by GI pending upper and lower endoscopy and had recent community-acquired pneumonia with oral antibiotic as outpatient.  She also feels depressed and somewhat frustrated.  She has orthopnea  but no chest pain palpitation or syncope. Past Medical History:  Diagnosis Date  . Anxiety   . Breast cancer ([place])    left  . Cancer ([place])   . Colon polyps   . Depression   . Hypertension   . Personal history of radiation therapy   . Radiation 04/09/15-05/23/15   left breast 50.4 gray, lumpectomy cavity boost 10 gray    Past Surgical History:  Procedure Laterality Date  . ABDOMINAL HYSTERECTOMY    . APPENDECTOMY    . BILATERAL OOPHORECTOMY    . BREAST LUMPECTOMY Left 02/2015  . CATARACT EXTRACTION Bilateral   . CHOLECYSTECTOMY    . RADIOACTIVE SEED GUIDED PARTIAL MASTECTOMY WITH AXILLARY SENTINEL LYMPH NODE BIOPSY Left 02/28/2015   Procedure: RADIOACTIVE SEED GUIDED PARTIAL MASTECTOMY WITH AXILLARY SENTINEL LYMPH NODE BIOPSY;  Surgeon: Autumn Messing III, MD;  Location: [facility];  Service: General;  Laterality: Left;    Current Medications: Current Meds  Medication Sig  . acetaminophen (TYLENOL 8 HOUR ARTHRITIS PAIN) 650 MG CR tablet Take 1,300 mg by mouth every 8 (eight) hours as needed for pain.  . Adalimumab 40 MG/0.4ML PSKT Inject 40 mg into the skin every 14 (fourteen) days.  Marland Kitchen albuterol (PROVENTIL HFA;VENTOLIN HFA) 108 (90 Base) MCG/ACT inhaler Inhale 2 puffs into the lungs every 4 (four) hours as needed.  . budesonide-formoterol (SYMBICORT) 160-4.5 MCG/ACT inhaler Inhale 2 puffs into the lungs 2 (two) times daily.  Marland Kitchen  calcium carbonate (CALCIUM 600) 600 MG TABS tablet Take 1 tablet by mouth daily.  . cholecalciferol (VITAMIN D) 1000 units tablet Take 1 tablet (1,000 Units total) by mouth daily.  . citalopram (CELEXA) 40 MG tablet Take 40 mg by mouth daily.  . fluticasone (FLONASE) 50 MCG/ACT nasal spray Place 1 spray into the nose daily.  . furosemide (LASIX) 20 MG tablet Take 1 tablet (20 mg total) by mouth daily.  . hydrOXYzine (ATARAX/VISTARIL) 25 MG tablet Take 25 mg by mouth daily as needed.   . irbesartan-hydrochlorothiazide (AVALIDE) 300-12.5 MG tablet  Take 1 tablet by mouth daily.  . montelukast (SINGULAIR) 10 MG tablet Take 10 mg by mouth at bedtime.  . pantoprazole (PROTONIX) 40 MG tablet Take 40 mg by mouth daily. Reported on 07/03/2015  . triamcinolone cream (KENALOG) 0.5 % Apply topically 2 (two) times daily.     Allergies:   Sulfa antibiotics   Social History   Socioeconomic History  . Marital status: Single    Spouse name: Not on file  . Number of children: 2  . Years of education: Not on file  . Highest education level: Not on file  Occupational History  . Occupation: Proofreader  Social Needs  . Financial resource strain: Not on file  . Food insecurity:    Worry: Not on file    Inability: Not on file  . Transportation needs:    Medical: Not on file    Non-medical: Not on file  Tobacco Use  . Smoking status: Never Smoker  . Smokeless tobacco: Never Used  Substance and Sexual Activity  . Alcohol use: No  . Drug use: No  . Sexual activity: Not on file  Lifestyle  . Physical activity:    Days per week: Not on file    Minutes per session: Not on file  . Stress: Not on file  Relationships  . Social connections:    Talks on phone: Not on file    Gets together: Not on file    Attends religious service: Not on file    Active member of club or organization: Not on file    Attends meetings of clubs or organizations: Not on file    Relationship status: Not on file  Other Topics Concern  . Not on file  Social History Narrative  . Not on file     Family History: The patient's family history includes Melanoma in her mother; Skin cancer in her father. ROS:   Please see the history of present illness.    All other systems reviewed and are negative.  EKGs/Labs/Other Studies Reviewed:    The following studies were reviewed today:    Recent Labs: 12/14/2017: NT-Pro BNP 185  Recent Lipid Panel No results found for: CHOL, TRIG, HDL, CHOLHDL, VLDL, LDLCALC, LDLDIRECT  Physical Exam:    VS:  BP (!)  162/70   Pulse 92   Ht 5\' 2"  (1.575 m)   Wt 289 lb 12.8 oz (131.5 kg)   SpO2 95%   BMI 53.01 kg/m     Wt Readings from Last 3 Encounters:  05/29/18 289 lb 12.8 oz (131.5 kg)  01/30/18 284 lb (128.8 kg)  12/14/17 278 lb 12.8 oz (126.5 kg)     GEN: Obese she is short of breath at rest well nourished, well developed in no acute distress HEENT: Normal NECK: No JVD; No carotid bruits LYMPHATICS: No lymphadenopathy CARDIAC: Distant heart sounds RRR, no murmurs, rubs, gallops RESPIRATORY: Diminished breath sounds without  rales, wheezing or rhonchi  ABDOMEN: Soft, non-tender, non-distended MUSCULOSKELETAL: 3+ bilateral to the knee pitting edema; No deformity  SKIN: Warm and dry NEUROLOGIC:  Alert and oriented x 3 PSYCHIATRIC:  Normal affect    Signed, Shirlee More, MD  05/29/2018 1:54 PM    [facility]

## 2018-05-29 ENCOUNTER — Ambulatory Visit (INDEPENDENT_AMBULATORY_CARE_PROVIDER_SITE_OTHER): Payer: Medicare Other | Admitting: Cardiology

## 2018-05-29 ENCOUNTER — Encounter: Payer: Self-pay | Admitting: Cardiology

## 2018-05-29 VITALS — BP 162/70 | HR 92 | Ht 62.0 in | Wt 289.8 lb

## 2018-05-29 DIAGNOSIS — I5032 Chronic diastolic (congestive) heart failure: Secondary | ICD-10-CM

## 2018-05-29 DIAGNOSIS — I110 Hypertensive heart disease with heart failure: Secondary | ICD-10-CM

## 2018-05-29 MED ORDER — TORSEMIDE 20 MG PO TABS: 20.0000 mg | ORAL_TABLET | ORAL | 3 refills | Status: DC

## 2018-05-29 NOTE — Patient Instructions (Addendum)
Medication Instructions:  Your physician has recommended you make the following change in your medication:  STOP: furosemide  START:  Torsemide 20mg  one tablet daily, take a second tablet in the afternoon as needed.  If you need a refill on your cardiac medications before your next appointment, please call your pharmacy.   Lab work: You will have lab work done at Dr Unk Lightning' office.  BMP, TSH, and proBNP   If you have labs (blood work) drawn today and your tests are completely normal, you will receive your results only by: Marland Kitchen MyChart Message (if you have MyChart) OR . A paper copy in the mail If you have any lab test that is abnormal or we need to change your treatment, we will call you to review the results.  Testing/Procedures: NONE  Follow-Up: At [facility], you and your health needs are our priority.  As part of our continuing mission to provide you with exceptional heart care, we have created designated Provider Care Teams.  These Care Teams include your primary Cardiologist (physician) and Advanced Practice Providers (APPs -  Physician Assistants and Nurse Practitioners) who all work together to provide you with the care you need, when you need it. You will need a follow up appointment in 3 months.

## 2018-07-31 ENCOUNTER — Other Ambulatory Visit: Payer: Self-pay | Admitting: Cardiology

## 2018-08-01 LAB — TSH: TSH: 3.1 u[IU]/mL (ref 0.450–4.500)

## 2018-08-01 LAB — BASIC METABOLIC PANEL
BUN/Creatinine Ratio: 23 (ref 12–28)
BUN: 30 mg/dL — ABNORMAL HIGH (ref 8–27)
CO2: 31 mmol/L — ABNORMAL HIGH (ref 20–29)
Calcium: 10 mg/dL (ref 8.7–10.3)
Chloride: 90 mmol/L — ABNORMAL LOW (ref 96–106)
Creatinine, Ser: 1.31 mg/dL — ABNORMAL HIGH (ref 0.57–1.00)
GFR calc Af Amer: 47 mL/min/{1.73_m2} — ABNORMAL LOW (ref >59)
GFR calc non Af Amer: 41 mL/min/{1.73_m2} — ABNORMAL LOW (ref >59)
Glucose: 175 mg/dL — ABNORMAL HIGH (ref 65–99)
Potassium: 3.7 mmol/L (ref 3.5–5.2)
Sodium: 140 mmol/L (ref 134–144)

## 2018-08-01 LAB — PRO B NATRIURETIC PEPTIDE: NT-Pro BNP: 163 pg/mL (ref 0–301)

## 2018-08-03 ENCOUNTER — Other Ambulatory Visit: Payer: Self-pay | Admitting: Orthopaedic Surgery

## 2018-08-03 DIAGNOSIS — M48062 Spinal stenosis, lumbar region with neurogenic claudication: Secondary | ICD-10-CM

## 2018-08-07 ENCOUNTER — Telehealth: Payer: Self-pay

## 2018-08-07 DIAGNOSIS — I5032 Chronic diastolic (congestive) heart failure: Secondary | ICD-10-CM

## 2018-08-07 DIAGNOSIS — I110 Hypertensive heart disease with heart failure: Secondary | ICD-10-CM

## 2018-08-07 DIAGNOSIS — I10 Essential (primary) hypertension: Secondary | ICD-10-CM

## 2018-08-07 NOTE — Telephone Encounter (Signed)
Results relayed to patient she will come in June 1 for repeat BMP. Moved up f/u visit for 08/24/18 with Dr. Bettina Gavia to discuss diuretics. Copy of results sent to Dr. Lyda Jester office per patient request. Rn expressed need for patient to stay hydrated.

## 2018-08-07 NOTE — Telephone Encounter (Signed)
-----   Message from Jenean Lindau, MD sent at 08/01/2018 12:07 PM EDT ----- Kidney function has deteriorated some.  Tell her to minimize her diuretic use to as little as she can get away with.  Recheck in 2 weeks.  Send a note to Dr. Bettina Gavia about this intervention Jenean Lindau, MD 08/01/2018 12:06 PM

## 2018-08-07 NOTE — Telephone Encounter (Signed)
Left message for patient to call back for results.  

## 2018-08-07 NOTE — Addendum Note (Signed)
Addended by: Beckey Rutter on: 08/07/2018 04:45 PM   Modules accepted: Orders

## 2018-08-07 NOTE — Telephone Encounter (Signed)
RN called patient and received phone consent for telemedicine visit. Patient instructed to have home BP cuff available, weigh herself and all medication out for reconciliation/refills. Patient scheduled for virtual visit and had no further questions.

## 2018-08-07 NOTE — Telephone Encounter (Signed)
Patient returned your call, please call back to [phone].

## 2018-08-08 ENCOUNTER — Other Ambulatory Visit: Payer: Medicare Other

## 2018-08-19 ENCOUNTER — Ambulatory Visit
Admission: RE | Admit: 2018-08-19 | Discharge: 2018-08-19 | Disposition: A | Discharge status: Home / Self Care | Payer: Medicare Other | Source: Ambulatory Visit | Attending: Orthopaedic Surgery | Admitting: Orthopaedic Surgery

## 2018-08-19 ENCOUNTER — Other Ambulatory Visit: Payer: Self-pay

## 2018-08-19 DIAGNOSIS — M48062 Spinal stenosis, lumbar region with neurogenic claudication: Secondary | ICD-10-CM

## 2018-08-22 LAB — BASIC METABOLIC PANEL
BUN/Creatinine Ratio: 27 (ref 12–28)
BUN: 42 mg/dL — ABNORMAL HIGH (ref 8–27)
CO2: 31 mmol/L — ABNORMAL HIGH (ref 20–29)
Calcium: 9.9 mg/dL (ref 8.7–10.3)
Chloride: 88 mmol/L — ABNORMAL LOW (ref 96–106)
Creatinine, Ser: 1.54 mg/dL — ABNORMAL HIGH (ref 0.57–1.00)
GFR calc Af Amer: 39 mL/min/{1.73_m2} — ABNORMAL LOW (ref >59)
GFR calc non Af Amer: 34 mL/min/{1.73_m2} — ABNORMAL LOW (ref >59)
Glucose: 100 mg/dL — ABNORMAL HIGH (ref 65–99)
Potassium: 4 mmol/L (ref 3.5–5.2)
Sodium: 139 mmol/L (ref 134–144)

## 2018-08-23 NOTE — Progress Notes (Signed)
Virtual Visit via Telephone Note   This visit type was conducted due to national recommendations for restrictions regarding the COVID-19 Pandemic (e.g. social distancing) in an effort to limit this patient's exposure and mitigate transmission in our community.  Due to her co-morbid illnesses, this patient is at least at moderate risk for complications without adequate follow up.  This format is felt to be most appropriate for this patient at this time.  The patient did not have access to video technology/had technical difficulties with video requiring transitioning to audio format only (telephone).  All issues noted in this document were discussed and addressed.  No physical exam could be performed with this format.  Please refer to the patient's chart for her  consent to telehealth for [facility].   Date:  08/24/2018   ID:  Liseth, Wann 08-16-1945, MRN [id number]  Patient Location: Home Provider Location: Office  PCP:  Myrlene Broker, MD  Cardiologist:  Shirlee More, MD  Electrophysiologist:  None   Evaluation Performed:  Follow-Up Visit  Chief Complaint:  Chest pain  History of Present Illness:    Chemika Millicent Blazejewski is a 73 y.o. female with  a hx of hypertension type 2 diabetes mellitus history of breast cancer with lumpectomy and radiation therapy in 4268 and diastolic heart failure with hypertension. She was last seen 05/29/18.    Concerning review of systems during our conversation. Reports continued DOE that is "about the same" as it has been. She reports she walks from the bedroom to the kitchen and begins having back pain and SOB. During these episodes she reports chest pain. She reports this chest pain occurs once per day, aggravated by activity, relieved by sitting down to rest. She is unable to describe the character of the pain. She does not feel her chest pain is "heart pain" but I am concerned as she has not mentioned chest pain to me in previous visits.    Reports no LE edema. She has not been weighing daily as her scale broke - recommended she purchase a new one. Endorses going to the restroom often on current diuretic dose and reports she is always thirsty. Drinks approximately eight 16oz drinks daily.  Does report numbness and tingling in her legs that she has been told is related to her back.   Denies palpitations, syncope, PND.  The patient does not have symptoms concerning for COVID-19 infection (fever, chills, cough, or new shortness of breath).    Past Medical History:  Diagnosis Date  . Anxiety   . Breast cancer ([place])    left  . Cancer ([place])   . Colon polyps   . Depression   . Hypertension   . Personal history of radiation therapy   . Radiation 04/09/15-05/23/15   left breast 50.4 gray, lumpectomy cavity boost 10 gray   Past Surgical History:  Procedure Laterality Date  . ABDOMINAL HYSTERECTOMY    . APPENDECTOMY    . BILATERAL OOPHORECTOMY    . BREAST LUMPECTOMY Left 02/2015  . CATARACT EXTRACTION Bilateral   . CHOLECYSTECTOMY    . RADIOACTIVE SEED GUIDED PARTIAL MASTECTOMY WITH AXILLARY SENTINEL LYMPH NODE BIOPSY Left 02/28/2015   Procedure: RADIOACTIVE SEED GUIDED PARTIAL MASTECTOMY WITH AXILLARY SENTINEL LYMPH NODE BIOPSY;  Surgeon: Autumn Messing III, MD;  Location: [facility];  Service: General;  Laterality: Left;     Current Meds  Medication Sig  . acetaminophen (TYLENOL 8 HOUR ARTHRITIS PAIN) 650 MG CR tablet Take 1,300  mg by mouth every 8 (eight) hours as needed for pain.  . Adalimumab 40 MG/0.4ML PSKT Inject 40 mg into the skin every 14 (fourteen) days.  Marland Kitchen albuterol (PROVENTIL HFA;VENTOLIN HFA) 108 (90 Base) MCG/ACT inhaler Inhale 2 puffs into the lungs every 4 (four) hours as needed.  . calcium carbonate (CALCIUM 600) 600 MG TABS tablet Take 1 tablet by mouth daily.  . cholecalciferol (VITAMIN D) 1000 units tablet Take 1 tablet (1,000 Units total) by mouth daily.  . citalopram (CELEXA) 40 MG tablet  Take 40 mg by mouth daily.  . fluticasone (FLONASE) 50 MCG/ACT nasal spray Place 1 spray into the nose daily.  . hydrOXYzine (ATARAX/VISTARIL) 25 MG tablet Take 25 mg by mouth daily as needed.   . irbesartan-hydrochlorothiazide (AVALIDE) 300-12.5 MG tablet Take 1 tablet by mouth daily.  . pantoprazole (PROTONIX) 40 MG tablet Take 40 mg by mouth daily. Reported on 07/03/2015  . torsemide (DEMADEX) 20 MG tablet Take 1 tablet (20 mg total) by mouth as directed.  . triamcinolone cream (KENALOG) 0.5 % Apply topically 2 (two) times daily.     Allergies:   Sulfa antibiotics   Social History   Tobacco Use  . Smoking status: Never Smoker  . Smokeless tobacco: Never Used  Substance Use Topics  . Alcohol use: No  . Drug use: No     Family Hx: The patient's family history includes Melanoma in her mother; Skin cancer in her father.  ROS:   Please see the history of present illness.     All other systems reviewed and are negative.   Prior CV studies:   The following studies were reviewed today:  Echo 01/2018:  Study Conclusions   - Left ventricle: The cavity size was normal. Wall thickness was   increased in a pattern of mild LVH. Systolic function was normal.   The estimated ejection fraction was in the range of 60% to 65%.   Wall motion was normal; there were no regional wall motion   abnormalities. Doppler parameters are consistent with abnormal   left ventricular relaxation (grade 1 diastolic dysfunction).   Impressions:   - 1. Normal systolic function.   2. Impaired LV relaxation.   3. Small pericardial effusion versus fat pad.   Labs/Other Tests and Data Reviewed:    EKG:  An ECG dated 12/14/2017 was personally reviewed today and demonstrated:  NSR rate 85 with L axis devation  Recent Labs: 07/31/2018: NT-Pro BNP 163; TSH 3.100 08/22/2018: BUN 42; Creatinine, Ser 1.54; Potassium 4.0; Sodium 139  GFR 34cc/min  Recent Lipid Panel No results found for: CHOL, TRIG, HDL,  CHOLHDL, LDLCALC, LDLDIRECT  Wt Readings from Last 3 Encounters:  08/24/18 288 lb (130.6 kg)  05/29/18 289 lb 12.8 oz (131.5 kg)  01/30/18 284 lb (128.8 kg)     Objective:    Vital Signs:  BP (!) 148/74 (BP Location: Left Arm, Patient Position: Sitting)   Pulse 88   Temp (!) 97.4 F (36.3 C)   Wt 288 lb (130.6 kg)   BMI 52.68 kg/m    VITAL SIGNS:  reviewed  ASSESSMENT & PLAN:    1. Chest pain - Describes chest pain associated with SOB during activity. Notes with little activity such as walking from bedroom to kitchen. She is unable to describe the character of the pain. It is relieved by rest. Repeats to me that it is "not heart pain", but I am uncertain who told her this and concerned by the very typical  anginal features of her chest pain.  Will plan for Myoview. 2. SOB on exertion - SOB on exertion with possible etiology of CAD versus fluid overload versus obesity versus respiratory. As ProBNP 07/31/18 was 163 she is not in acutely decompensated heart failure. See above for plan.  3. Hypertensive heart disease with heart failure - NYHA class II. Echo 01/2018 with EF 60-65% and grade 1 diastolic dysfunction.   Labs 07/31/18 with creatinine 1.31 and GFR 41 - will adjust anti-hypertensive medications. Stop irbesartan-HCTZ and start Hydralazine 25mg  BID.   Reports no lower extremity edema. Notes constant thirst with her current diuretic. Drinks eight or nine 16oz bottles of fluid daily for a total of 3.8L of fluids - will ask her to restrict to 3L per day. Consider uncontrolled hyperglycemia for etiology of thirst.  4. CKD - Labs 5/11 creatinine 1.31 and GFR 41. Adjustment of antihypertensive medications as above for renal protection.    Repeat BMP in 2 weeks.  5. Breast cancer - Follows with PCP and oncology. Note increased risk for cardiovascular disease.   COVID-19 Education: The signs and symptoms of COVID-19 were discussed with the patient and how to seek care for testing  (follow up with PCP or arrange E-visit).  The importance of social distancing was discussed today.  Time:   Today, I have spent 20 minutes with the patient with telehealth technology discussing the above problems.     Medication Adjustments/Labs and Tests Ordered: Current medicines are reviewed at length with the patient today.  Concerns regarding medicines are outlined above.   Tests Ordered: No orders of the defined types were placed in this encounter.   Medication Changes: No orders of the defined types were placed in this encounter.   Disposition:  Follow up in 4 week(s)  Signed, Shirlee More, MD  08/24/2018 10:56 AM    [facility]

## 2018-08-24 ENCOUNTER — Other Ambulatory Visit: Payer: Self-pay

## 2018-08-24 ENCOUNTER — Encounter: Payer: Self-pay | Admitting: Cardiology

## 2018-08-24 ENCOUNTER — Telehealth (INDEPENDENT_AMBULATORY_CARE_PROVIDER_SITE_OTHER): Payer: Medicare Other | Admitting: Cardiology

## 2018-08-24 ENCOUNTER — Encounter: Payer: Self-pay | Admitting: *Deleted

## 2018-08-24 VITALS — BP 148/74 | HR 88 | Temp 97.4°F | Wt 288.0 lb

## 2018-08-24 DIAGNOSIS — R079 Chest pain, unspecified: Secondary | ICD-10-CM

## 2018-08-24 DIAGNOSIS — I110 Hypertensive heart disease with heart failure: Secondary | ICD-10-CM | POA: Diagnosis not present

## 2018-08-24 DIAGNOSIS — I5032 Chronic diastolic (congestive) heart failure: Secondary | ICD-10-CM | POA: Diagnosis not present

## 2018-08-24 DIAGNOSIS — I129 Hypertensive chronic kidney disease with stage 1 through stage 4 chronic kidney disease, or unspecified chronic kidney disease: Secondary | ICD-10-CM

## 2018-08-24 DIAGNOSIS — I130 Hypertensive heart and chronic kidney disease with heart failure and stage 1 through stage 4 chronic kidney disease, or unspecified chronic kidney disease: Secondary | ICD-10-CM

## 2018-08-24 MED ORDER — HYDRALAZINE HCL 25 MG PO TABS: 25.0000 mg | ORAL_TABLET | Freq: Two times a day (BID) | ORAL | 1 refills | Status: DC

## 2018-08-24 NOTE — Patient Instructions (Signed)
Medication Instructions:  Your physician has recommended you make the following change in your medication:   STOP: Irbesartan/HCTZ  START: Hydralazine 25 mg (1 tab) twice daily  If you need a refill on your cardiac medications before your next appointment, please call your pharmacy.   Lab work: Your physician recommends that you return for lab work in: 2 weeks BMP  If you have labs (blood work) drawn today and your tests are completely normal, you will receive your results only by: Marland Kitchen MyChart Message (if you have MyChart) OR . A paper copy in the mail If you have any lab test that is abnormal or we need to change your treatment, we will call you to review the results.  Testing/Procedures: Your physician has requested that you have a lexiscan myoview. For further information please visit [email or web address]. Please follow instruction sheet, as given.    [facility] [address] [place], [place] [address] Phone:  [phone]  Donna 4, 2020    Donna Shaffer DOB: 21-Sep-1945 MRN: [id number] [address] [place] [address]   Dear Donna Shaffer,  You are scheduled for a 2 day  Myocardial Perfusion Imaging Study XK:GYJE 30,2020 at 11:15 and September 20 2018 at 11:00   .  Please arrive 15 minutes prior to your appointment time for registration and insurance purposes.  The test will take approximately 3 to 4 hours to complete; you may bring reading material.  If someone comes with you to your appointment, they will need to remain in the main lobby due to limited space in the testing area. **If you are pregnant or breastfeeding, please notify the nuclear lab prior to your appointment**  How to prepare for your Myocardial Perfusion Test: . Do not eat or drink 3 hours prior to your test, except you may have water. . Do not consume products containing caffeine (regular or decaffeinated) 12 hours prior to your test. (ex: coffee, chocolate, sodas, tea). . Do  bring a list of your current medications with you.  If not listed below, you may take your medications as normal. . Do wear comfortable clothes (no dresses or overalls) and walking shoes, tennis shoes preferred (No heels or open toe shoes are allowed). . Do NOT wear cologne, perfume, aftershave, or lotions (deodorant is allowed). . If these instructions are not followed, your test will have to be rescheduled.  Please report to [address] for your test.  If you have questions or concerns about your appointment, you can call the [facility] at [phone].  If you cannot keep your appointment, please provide 24 hours notification to the Nuclear Lab, to avoid a possible $50 charge to your account.       Follow-Up: At [facility], you and your health needs are our priority.  As part of our continuing mission to provide you with exceptional heart care, we have created designated Provider Care Teams.  These Care Teams include your primary Cardiologist (physician) and Advanced Practice Providers (APPs -  Physician Assistants and Nurse Practitioners) who all work together to provide you with the care you need, when you need it. You will need a follow up appointment in 4 weeks.  Any Other Special Instructions Will Be Listed Below (If Applicable).

## 2018-09-04 ENCOUNTER — Ambulatory Visit: Payer: Medicare Other | Admitting: Cardiology

## 2018-09-11 ENCOUNTER — Telehealth: Payer: Self-pay | Admitting: Cardiology

## 2018-09-11 NOTE — Telephone Encounter (Signed)
Spoke with Dr Bettina Gavia regarding patients symptoms and advised that patient be seen.  Patient offered an appointment to be seen by Dr Geraldo Pitter in [place] office, but patient refused per Lattie Haw.  Patient was scheduled to see Dr Bettina Gavia on 09-12-2018.

## 2018-09-11 NOTE — Telephone Encounter (Signed)
Patient came in the office with severe SOB and swelling lower extremeties, Dr. Bettina Gavia changed her meds and she is having worse swelling in belly and feet, she is having chest pressure some worse past 2 days, she came to office today for labs for Kidney level.

## 2018-09-12 ENCOUNTER — Ambulatory Visit (INDEPENDENT_AMBULATORY_CARE_PROVIDER_SITE_OTHER): Payer: Medicare Other | Admitting: Cardiology

## 2018-09-12 ENCOUNTER — Other Ambulatory Visit: Payer: Self-pay

## 2018-09-12 VITALS — BP 164/80 | HR 81 | Temp 97.8°F | Ht 62.0 in | Wt 301.1 lb

## 2018-09-12 DIAGNOSIS — I130 Hypertensive heart and chronic kidney disease with heart failure and stage 1 through stage 4 chronic kidney disease, or unspecified chronic kidney disease: Secondary | ICD-10-CM

## 2018-09-12 DIAGNOSIS — R079 Chest pain, unspecified: Secondary | ICD-10-CM

## 2018-09-12 LAB — BASIC METABOLIC PANEL
BUN/Creatinine Ratio: 18 (ref 12–28)
BUN: 20 mg/dL (ref 8–27)
CO2: 29 mmol/L (ref 20–29)
Calcium: 9.5 mg/dL (ref 8.7–10.3)
Chloride: 97 mmol/L (ref 96–106)
Creatinine, Ser: 1.09 mg/dL — ABNORMAL HIGH (ref 0.57–1.00)
GFR calc Af Amer: 59 mL/min/{1.73_m2} — ABNORMAL LOW (ref >59)
GFR calc non Af Amer: 51 mL/min/{1.73_m2} — ABNORMAL LOW (ref >59)
Glucose: 114 mg/dL — ABNORMAL HIGH (ref 65–99)
Potassium: 3.5 mmol/L (ref 3.5–5.2)
Sodium: 140 mmol/L (ref 134–144)

## 2018-09-12 MED ORDER — TORSEMIDE 20 MG PO TABS: 20.0000 mg | ORAL_TABLET | Freq: Two times a day (BID) | ORAL | 1 refills | Status: DC

## 2018-09-12 MED ORDER — POTASSIUM CHLORIDE CRYS ER 20 MEQ PO TBCR: 20.0000 meq | EXTENDED_RELEASE_TABLET | Freq: Every day | ORAL | 1 refills | Status: DC

## 2018-09-12 NOTE — Patient Instructions (Addendum)
Medication Instructions:  Your physician has recommended you make the following change in your medication:   INCREASE: Demadex(torsemide) 20 mg : Take twice daily  START: Potassium 20 meq: Take 1 tab daily  If you need a refill on your cardiac medications before your next appointment, please call your pharmacy.   Lab work: None If you have labs (blood work) drawn today and your tests are completely normal, you will receive your results only by: Marland Kitchen MyChart Message (if you have MyChart) OR . A paper copy in the mail If you have any lab test that is abnormal or we need to change your treatment, we will call you to review the results.  Testing/Procedures: None  Follow-Up: At [facility], you and your health needs are our priority.  As part of our continuing mission to provide you with exceptional heart care, we have created designated Provider Care Teams.  These Care Teams include your primary Cardiologist (physician) and Advanced Practice Providers (APPs -  Physician Assistants and Nurse Practitioners) who all work together to provide you with the care you need, when you need it. You will need a follow up appointment in 2 weeks.  Any Other Special Instructions Will Be Listed Below (If Applicable).  Potassium chloride tablets, extended-release tablets or capsules What is this medicine? POTASSIUM CHLORIDE (poe TASS i um KLOOR ide) is a potassium supplement used to prevent and to treat low potassium. Potassium is important for the heart, muscles, and nerves. Too much or too little potassium in the body can cause serious problems. This medicine may be used for other purposes; ask your health care provider or pharmacist if you have questions. COMMON BRAND NAME(S): ED-K+10, K-10, K-8, K-Dur, K-Tab, Kaon-CL, Klor-Con, Klor-Con M10, Klor-Con M15, Klor-Con M20, Klotrix, Micro-K, Micro-K Extencaps, Slow-K What should I tell my health care provider before I take this medicine? They need to know if  you have any of these conditions: -Addison's disease -dehydration -diabetes -difficulty swallowing -heart disease -high levels of potassium in the blood -irregular heartbeat -kidney disease -recent severe burn -stomach ulcers or other stomach problems -an unusual or allergic reaction to potassium, tartrazine, other medicines, foods, dyes, or preservatives -pregnant or trying to get pregnant -breast-feeding How should I use this medicine? Take this medicine by mouth with a full glass of water. Take with food. Follow the directions on the prescription label. Do not suck on, crush, or chew this medicine. If you have difficulty swallowing, ask the pharmacist how to take. Take your medicine at regular intervals. Do not take it more often than directed. Do not stop taking except on your doctor's advice. Talk to your pediatrician regarding the use of this medicine in children. Special care may be needed. Overdosage: If you think you have taken too much of this medicine contact a poison control center or emergency room at once. NOTE: This medicine is only for you. Do not share this medicine with others. What if I miss a dose? If you miss a dose, take it as soon as you can. If it is almost time for your next dose, take only that dose. Do not take double or extra doses. What may interact with this medicine? Do not take this medicine with any of the following medications: -certain diuretics such as spironolactone, triamterene -certain medicines for stomach problems like atropine; difenoxin and glycopyrrolate -eplerenone -sodium polystyrene sulfonate This medicine may also interact with the following medications: -certain medicines for blood pressure or heart disease like lisinopril, losartan, quinapril, valsartan -  medicines that lower your chance of fighting infection such as cyclosporine, tacrolimus -NSAIDs, medicines for pain and inflammation, like ibuprofen or naproxen -other potassium  supplements -salt substitutes This list may not describe all possible interactions. Give your health care provider a list of all the medicines, herbs, non-prescription drugs, or dietary supplements you use. Also tell them if you smoke, drink alcohol, or use illegal drugs. Some items may interact with your medicine. What should I watch for while using this medicine? Visit your doctor or health care professional for regular check ups. You will need lab work done regularly. You may need to be on a special diet while taking this medicine. Ask your doctor. What side effects may I notice from receiving this medicine? Side effects that you should report to your doctor or health care professional as soon as possible: -allergic reactions like skin rash, itching or hives, swelling of the face, lips, or tongue -black, tarry stools -breathing problems -confusion -heartburn -fast, irregular heartbeat -feeling faint or lightheaded, falls -low blood pressure -numbness or tingling in hands or feet -pain when swallowing -unusually weak or tired -weakness, heaviness of legs Side effects that usually do not require medical attention (report to your doctor or health care professional if they continue or are bothersome): -diarrhea -nausea, vomiting -stomach pain This list may not describe all possible side effects. Call your doctor for medical advice about side effects. You may report side effects to FDA at 1-800-FDA-1088. Where should I keep my medicine? Keep out of the reach of children. Store at room temperature between 15 and 30 degrees C (59 and 86 degrees F ). Keep bottle closed tightly to protect this medicine from light and moisture. Throw away any unused medicine after the expiration date. NOTE: This sheet is a summary. It may not cover all possible information. If you have questions about this medicine, talk to your doctor, pharmacist, or health care provider.  2019 Elsevier/Gold Standard  (2015-12-10 11:43:27)

## 2018-09-12 NOTE — Progress Notes (Signed)
Cardiology Office Note:    Date:  09/12/2018   ID:  Ka, Bench 1946-03-05, MRN [id number]  PCP:  Myrlene Broker, MD  Cardiologist:  Shirlee More, MD    Referring MD: Myrlene Broker, MD    ASSESSMENT:    1. 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 Chest pain, unspecified type    PLAN:    In order of problems listed above:  1. Worsened decompensated heart failure double her diuretic reassess in 2 weeks initiate potassium.  Hypertension should improve with diuresis continue hydralazine 2. Atypical EKG stable proceed with scheduled myocardial perfusion test   Next appointment: Scheduled for July 10   Medication Adjustments/Labs and Tests Ordered: Current medicines are reviewed at length with the patient today.  Concerns regarding medicines are outlined above.  No orders of the defined types were placed in this encounter.  No orders of the defined types were placed in this encounter.   No chief complaint on file.   History of Present Illness:    Donna Shaffer is a 73 y.o. female with a hx of diastolic heart failure, hypertension, Type 2 DM  and chest pain last seen 05/04/20202 . She had breast cancer with lumpectomy and radiation treatment in 2017.    last seen 2 weeks ago. Compliance with diet, lifestyle and medications: Yes and is pending a MPI.  She came to our office in [place] have labs drawn yesterday and received a message from triage that she needed to be seen quickly with chest pain. Her issue is that she is gained 13 pounds increasing edema and she is quite short of breath with any activity struggled in another car and into the office yesterday and is having orthopnea.  Associated with this she is having nonexertional chest pain at night and she says she is very short of breath supine she is sleeping on a couch with her head  propped up and has intermittent sharp momentary chest pain.  When seen today she has obvious decompensated heart failure her kidney function is improved I will double the dose of her diuretic and she is scheduled for myocardial perfusion study this week. Past Medical History:  Diagnosis Date  . Anxiety   . Breast cancer ([place])    left  . Cancer ([place])   . Colon polyps   . Depression   . Hypertension   . Personal history of radiation therapy   . Radiation 04/09/15-05/23/15   left breast 50.4 gray, lumpectomy cavity boost 10 gray    Past Surgical History:  Procedure Laterality Date  . ABDOMINAL HYSTERECTOMY    . APPENDECTOMY    . BILATERAL OOPHORECTOMY    . BREAST LUMPECTOMY Left 02/2015  . CATARACT EXTRACTION Bilateral   . CHOLECYSTECTOMY    . RADIOACTIVE SEED GUIDED PARTIAL MASTECTOMY WITH AXILLARY SENTINEL LYMPH NODE BIOPSY Left 02/28/2015   Procedure: RADIOACTIVE SEED GUIDED PARTIAL MASTECTOMY WITH AXILLARY SENTINEL LYMPH NODE BIOPSY;  Surgeon: Autumn Messing III, MD;  Location: [facility];  Service: General;  Laterality: Left;    Current Medications: Current Meds  Medication Sig  . acetaminophen (TYLENOL 8 HOUR ARTHRITIS PAIN) 650 MG CR tablet Take 1,300 mg by mouth every 8 (eight) hours as needed for pain.  . Adalimumab 40 MG/0.4ML PSKT Inject 40 mg into the skin every 14 (fourteen) days.  . calcium carbonate (CALCIUM 600)  600 MG TABS tablet Take 1 tablet by mouth daily.  . cholecalciferol (VITAMIN D) 1000 units tablet Take 1 tablet (1,000 Units total) by mouth daily.  . citalopram (CELEXA) 40 MG tablet Take 40 mg by mouth daily.  . fluticasone (FLONASE) 50 MCG/ACT nasal spray Place 1 spray into the nose daily.  . hydrALAZINE (APRESOLINE) 25 MG tablet Take 1 tablet (25 mg total) by mouth 2 (two) times a day.  . hydrOXYzine (ATARAX/VISTARIL) 25 MG tablet Take 25 mg by mouth daily as needed.   . pantoprazole (PROTONIX) 40 MG tablet Take 40 mg by mouth daily. Reported on  07/03/2015  . torsemide (DEMADEX) 20 MG tablet Take 1 tablet (20 mg total) by mouth as directed.  . traMADol (ULTRAM) 50 MG tablet TK 1 T PO Q 6 H PRN P  . triamcinolone cream (KENALOG) 0.5 % Apply topically 2 (two) times daily.     Allergies:   Sulfa antibiotics   Social History   Socioeconomic History  . Marital status: Single    Spouse name: Not on file  . Number of children: 2  . Years of education: Not on file  . Highest education level: Not on file  Occupational History  . Occupation: Proofreader  Social Needs  . Financial resource strain: Not on file  . Food insecurity    Worry: Not on file    Inability: Not on file  . Transportation needs    Medical: Not on file    Non-medical: Not on file  Tobacco Use  . Smoking status: Never Smoker  . Smokeless tobacco: Never Used  Substance and Sexual Activity  . Alcohol use: No  . Drug use: No  . Sexual activity: Not on file  Lifestyle  . Physical activity    Days per week: Not on file    Minutes per session: Not on file  . Stress: Not on file  Relationships  . Social Herbalist on phone: Not on file    Gets together: Not on file    Attends religious service: Not on file    Active member of club or organization: Not on file    Attends meetings of clubs or organizations: Not on file    Relationship status: Not on file  Other Topics Concern  . Not on file  Social History Narrative  . Not on file     Family History: The patient's family history includes Melanoma in her mother; Skin cancer in her father. ROS:   Please see the history of present illness.    All other systems reviewed and are negative.  EKGs/Labs/Other Studies Reviewed:    The following studies were reviewed today:  EKG:  EKG ordered today and personally reviewed.  The ekg ordered today demonstrates stable pattern sinus rhythm left anterior hemiblock  Recent Labs: 07/31/2018: NT-Pro BNP 163; TSH 3.100 09/11/2018: BUN 20;  Creatinine, Ser 1.09; Potassium 3.5; Sodium 140  Recent Lipid Panel No results found for: CHOL, TRIG, HDL, CHOLHDL, VLDL, LDLCALC, LDLDIRECT  Physical Exam:    VS:  BP (!) 164/80 (BP Location: Right Arm, Patient Position: Sitting, Cuff Size: Large)   Pulse 81   Temp 97.8 F (36.6 C)   Ht 5\' 2"  (1.575 m)   Wt (!) 301 lb 1.9 oz (136.6 kg)   SpO2 95%   BMI 55.08 kg/m     Wt Readings from Last 3 Encounters:  09/12/18 (!) 301 lb 1.9 oz (136.6 kg)  08/24/18 288  lb (130.6 kg)  05/29/18 289 lb 12.8 oz (131.5 kg)     GEN: Morbidly obese BMI exceeds 50 well nourished, well developed in no acute distress HEENT: Normal NECK: No JVD; No carotid bruits LYMPHATICS: No lymphadenopathy CARDIAC: Distant heart sounds RRR, no murmurs, rubs, gallops RESPIRATORY:  Clear to auscultation without rales, wheezing or rhonchi  ABDOMEN: Soft, non-tender, non-distended MUSCULOSKELETAL: Bilateral ankle to knee 2+ pitting edema; No deformity  SKIN: Warm and dry NEUROLOGIC:  Alert and oriented x 3 PSYCHIATRIC:  Normal affect    Signed, Shirlee More, MD  09/12/2018 9:46 AM    [place]

## 2018-09-15 ENCOUNTER — Telehealth (HOSPITAL_COMMUNITY): Payer: Self-pay | Admitting: *Deleted

## 2018-09-15 DIAGNOSIS — F3342 Major depressive disorder, recurrent, in full remission: Secondary | ICD-10-CM

## 2018-09-15 DIAGNOSIS — R600 Localized edema: Secondary | ICD-10-CM | POA: Insufficient documentation

## 2018-09-15 DIAGNOSIS — R609 Edema, unspecified: Secondary | ICD-10-CM

## 2018-09-15 HISTORY — DX: Localized edema: R60.0

## 2018-09-15 HISTORY — DX: Major depressive disorder, recurrent, in full remission: F33.42

## 2018-09-15 HISTORY — DX: Edema, unspecified: R60.9

## 2018-09-15 NOTE — Telephone Encounter (Signed)
Attempted to call patient regarding upcoming appointment- no answer. Unable to leave a message.  Donna Shaffer

## 2018-09-17 ENCOUNTER — Encounter: Payer: Self-pay | Admitting: Internal Medicine

## 2018-09-17 DIAGNOSIS — K219 Gastro-esophageal reflux disease without esophagitis: Secondary | ICD-10-CM

## 2018-09-17 DIAGNOSIS — I5033 Acute on chronic diastolic (congestive) heart failure: Secondary | ICD-10-CM

## 2018-09-17 DIAGNOSIS — I361 Nonrheumatic tricuspid (valve) insufficiency: Secondary | ICD-10-CM

## 2018-09-17 DIAGNOSIS — R079 Chest pain, unspecified: Secondary | ICD-10-CM | POA: Diagnosis not present

## 2018-09-17 DIAGNOSIS — F418 Other specified anxiety disorders: Secondary | ICD-10-CM

## 2018-09-17 DIAGNOSIS — I350 Nonrheumatic aortic (valve) stenosis: Secondary | ICD-10-CM

## 2018-09-17 DIAGNOSIS — I10 Essential (primary) hypertension: Secondary | ICD-10-CM

## 2018-09-17 DIAGNOSIS — J9601 Acute respiratory failure with hypoxia: Secondary | ICD-10-CM

## 2018-09-17 DIAGNOSIS — I340 Nonrheumatic mitral (valve) insufficiency: Secondary | ICD-10-CM | POA: Diagnosis not present

## 2018-09-18 DIAGNOSIS — R079 Chest pain, unspecified: Secondary | ICD-10-CM | POA: Diagnosis not present

## 2018-09-18 DIAGNOSIS — I5033 Acute on chronic diastolic (congestive) heart failure: Secondary | ICD-10-CM | POA: Diagnosis not present

## 2018-09-18 DIAGNOSIS — R0600 Dyspnea, unspecified: Secondary | ICD-10-CM

## 2018-09-18 DIAGNOSIS — J9601 Acute respiratory failure with hypoxia: Secondary | ICD-10-CM | POA: Diagnosis not present

## 2018-09-19 ENCOUNTER — Telehealth: Payer: Self-pay | Admitting: *Deleted

## 2018-09-19 ENCOUNTER — Ambulatory Visit: Payer: Medicare Other

## 2018-09-19 DIAGNOSIS — R0600 Dyspnea, unspecified: Secondary | ICD-10-CM | POA: Diagnosis not present

## 2018-09-19 DIAGNOSIS — R079 Chest pain, unspecified: Secondary | ICD-10-CM | POA: Diagnosis not present

## 2018-09-19 DIAGNOSIS — I5033 Acute on chronic diastolic (congestive) heart failure: Secondary | ICD-10-CM | POA: Diagnosis not present

## 2018-09-19 DIAGNOSIS — J9601 Acute respiratory failure with hypoxia: Secondary | ICD-10-CM | POA: Diagnosis not present

## 2018-09-19 NOTE — Telephone Encounter (Signed)
Attempted to contact patient regarding normal lexiscan results per Dr. Bettina Gavia with no answer. Unable to leave voicemail, will continue efforts.

## 2018-09-20 ENCOUNTER — Ambulatory Visit: Payer: Medicare Other

## 2018-09-20 DIAGNOSIS — R0600 Dyspnea, unspecified: Secondary | ICD-10-CM | POA: Diagnosis not present

## 2018-09-20 DIAGNOSIS — R079 Chest pain, unspecified: Secondary | ICD-10-CM | POA: Diagnosis not present

## 2018-09-20 DIAGNOSIS — I5033 Acute on chronic diastolic (congestive) heart failure: Secondary | ICD-10-CM | POA: Diagnosis not present

## 2018-09-20 DIAGNOSIS — J9601 Acute respiratory failure with hypoxia: Secondary | ICD-10-CM | POA: Diagnosis not present

## 2018-09-21 DIAGNOSIS — R079 Chest pain, unspecified: Secondary | ICD-10-CM | POA: Diagnosis not present

## 2018-09-21 DIAGNOSIS — I5033 Acute on chronic diastolic (congestive) heart failure: Secondary | ICD-10-CM | POA: Diagnosis not present

## 2018-09-21 DIAGNOSIS — J9601 Acute respiratory failure with hypoxia: Secondary | ICD-10-CM | POA: Diagnosis not present

## 2018-09-21 DIAGNOSIS — R0600 Dyspnea, unspecified: Secondary | ICD-10-CM | POA: Diagnosis not present

## 2018-09-21 NOTE — Telephone Encounter (Signed)
Called patient with no answer. Called patient's daughter, [name], per [facility] and advised for lexiscan results. [name] verbalized understanding. No further questions.

## 2018-09-29 ENCOUNTER — Telehealth: Payer: Medicare Other | Admitting: Cardiology

## 2018-09-29 DIAGNOSIS — G4733 Obstructive sleep apnea (adult) (pediatric): Secondary | ICD-10-CM | POA: Insufficient documentation

## 2018-09-29 DIAGNOSIS — N179 Acute kidney failure, unspecified: Secondary | ICD-10-CM

## 2018-09-29 HISTORY — DX: Acute kidney failure, unspecified: N17.9

## 2018-09-29 HISTORY — DX: Obstructive sleep apnea (adult) (pediatric): G47.33

## 2018-10-01 NOTE — Progress Notes (Signed)
Cardiology Office Note:    Date:  10/03/2018   ID:  Henreitta, Spittler 09-May-1945, MRN [id number]  PCP:  Myrlene Broker, MD  Cardiologist:  Shirlee More, MD    Referring MD: Myrlene Broker, MD  I spent 40 minutes with the patient and daughter reviewing records from the hospital reviewing results examining formulating a treatment plan arranging for follow-up.  ASSESSMENT:    1. 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 Chronic diastolic heart failure (HCC)   3. Chest pain, unspecified type    PLAN:    In order of problems listed above:  1. She is worsened with her shortness of breath remains in decompensated heart failure.  I would add metolazone to her torsemide recheck renal function proBNP see back in the office in 1 week if chest pain remains prominent and if she is capable posturing would consider coronary angiography if renal function is preserved.  Her shortness of breath is clearly multifactorial related to obesity asthma likely severe underlying sleep apnea untreated chronic respiratory failure requiring oxygen and hypertension with heart failure.  Continue current antihypertensives 2. Heart failure is decompensated see plan above 3. Despite a normal myocardial perfusion study remain concerned regarding CAD and ischemia I just do not think physically she could posture to undergo coronary angiography at this time without intubation. 4. She has mild aortic stenosis and regurgitation not responsible for symptoms   Next appointment: 1 week   Medication Adjustments/Labs and Tests Ordered: Current medicines are reviewed at length with the patient today.  Concerns regarding medicines are outlined above.  Orders Placed This Encounter  Procedures   Basic Metabolic Panel (BMET)   Pro b natriuretic peptide   EKG 12-Lead   Meds ordered this encounter    Medications   albuterol (VENTOLIN HFA) 108 (90 Base) MCG/ACT inhaler    Sig: Inhale 2 puffs into the lungs every 4 (four) hours as needed.    Dispense:  90 g    Refill:  2   metolazone (ZAROXOLYN) 5 MG tablet    Sig: Take 0.5 tablets (2.5 mg total) by mouth daily. Take 1/2 hour prior to torsemide    Dispense:  90 tablet    Refill:  1    No chief complaint on file.   History of Present Illness:    Donna Shaffer is a 73 y.o. female with a hx of diastolic heart failure, hypertension, Type 2 DM  and chest pain last seen 06/23/20202 with decompensated heart failure and chest pain was scheduled for myocardial perfusion test. She had breast cancer with lumpectomy and radiation treatment in 2017.    A pharmacologic myocardial perfusion study at [facility] showed normal perfusion and function. 09/19/2018  Compliance with diet, lifestyle and medications: Yes  Unknown to me she was admitted to [facility] 09/17/2018 I reviewed the records was in the room with the patient she was short of breath they did a CTA of her chest and showed no pulmonary embolism they suspected that she had sleep apnea tried BiPAP and she could not tolerate it she was sent home on oxygen she has told her problem is heart failure but her proBNP level was 238 ejection fraction was normal and she had mild aortic stenosis and aortic regurgitation.  She received some IV diuretic she had diuresis but her creatinine rose after the contrast study and  IV diuretics and she was discharged.  Although she said her edema has lessened she is unimproved she is short of breath with any activity she is wheezing she is run out of her bronchodilators she still has peripheral and abdominal edema and she is short of breath with any effort because shortness of breath she has test chest tightness.  She uses a clenched fist her chest and says she has tightness substernal with any physical effort and is sleeping sitting upright.  To  try to compensate heart failure program add metolazone to her high dose loop diuretic recheck renal function today reassess in the office in a week and reconsider the issue of coronary angiography.  At this time I think she is in such a decompensated heart failure she have to be intubated to have the procedure.  If worsen she will present to the emergency room.  Daughter is involved in her care and they are restricting salt she is unable to weigh herself at home with a weight of over 300 pounds Past Medical History:  Diagnosis Date   Anxiety    Breast cancer ([place])    left   Cancer [facility])    Colon polyps    Depression    Hypertension    Personal history of radiation therapy    Radiation 04/09/15-05/23/15   left breast 50.4 gray, lumpectomy cavity boost 10 gray    Past Surgical History:  Procedure Laterality Date   ABDOMINAL HYSTERECTOMY     APPENDECTOMY     BILATERAL OOPHORECTOMY     BREAST LUMPECTOMY Left 02/2015   CATARACT EXTRACTION Bilateral    CHOLECYSTECTOMY     RADIOACTIVE SEED GUIDED PARTIAL MASTECTOMY WITH AXILLARY SENTINEL LYMPH NODE BIOPSY Left 02/28/2015   Procedure: RADIOACTIVE SEED GUIDED PARTIAL MASTECTOMY WITH AXILLARY SENTINEL LYMPH NODE BIOPSY;  Surgeon: Autumn Messing III, MD;  Location: [facility];  Service: General;  Laterality: Left;    Current Medications: Current Meds  Medication Sig   Adalimumab 40 MG/0.4ML PSKT Inject 40 mg into the skin every 14 (fourteen) days.   albuterol (VENTOLIN HFA) 108 (90 Base) MCG/ACT inhaler Inhale 2 puffs into the lungs every 4 (four) hours as needed.   budesonide-formoterol (SYMBICORT) 160-4.5 MCG/ACT inhaler Inhale 2 puffs into the lungs 2 (two) times a day.   calcium carbonate (CALCIUM 600) 600 MG TABS tablet Take 1 tablet by mouth daily.   cholecalciferol (VITAMIN D) 1000 units tablet Take 1 tablet (1,000 Units total) by mouth daily.   citalopram (CELEXA) 40 MG tablet Take 40 mg by mouth daily.    fluticasone (FLONASE) 50 MCG/ACT nasal spray Place 1 spray into the nose daily.   hydrALAZINE (APRESOLINE) 25 MG tablet Take 1 tablet (25 mg total) by mouth 2 (two) times a day.   hydrOXYzine (ATARAX/VISTARIL) 25 MG tablet Take 25 mg by mouth daily as needed.    OXYGEN Inhale 3 L into the lungs continuous.   pantoprazole (PROTONIX) 40 MG tablet Take 40 mg by mouth daily. Reported on 07/03/2015   potassium chloride SA (KLOR-CON M20) 20 MEQ tablet Take 1 tablet (20 mEq total) by mouth daily.   torsemide (DEMADEX) 20 MG tablet Take 1 tablet (20 mg total) by mouth 2 (two) times daily.   traMADol (ULTRAM) 50 MG tablet TK 1 T PO Q 6 H PRN P   triamcinolone cream (KENALOG) 0.5 % Apply topically 2 (two) times daily.   [DISCONTINUED] albuterol (PROVENTIL HFA;VENTOLIN HFA) 108 (90 Base) MCG/ACT inhaler Inhale 2  puffs into the lungs every 4 (four) hours as needed.     Allergies:   Ace inhibitors and Sulfa antibiotics   Social History   Socioeconomic History   Marital status: Single    Spouse name: Not on file   Number of children: 2   Years of education: Not on file   Highest education level: Not on file  Occupational History   Occupation: Proofreader  Social Needs   Financial resource strain: Not on file   Food insecurity    Worry: Not on file    Inability: Not on file   Transportation needs    Medical: Not on file    Non-medical: Not on file  Tobacco Use   Smoking status: Never Smoker   Smokeless tobacco: Never Used  Substance and Sexual Activity   Alcohol use: No   Drug use: No   Sexual activity: Not on file  Lifestyle   Physical activity    Days per week: Not on file    Minutes per session: Not on file   Stress: Not on file  Relationships   Social connections    Talks on phone: Not on file    Gets together: Not on file    Attends religious service: Not on file    Active member of club or organization: Not on file    Attends meetings of clubs  or organizations: Not on file    Relationship status: Not on file  Other Topics Concern   Not on file  Social History Narrative   Not on file     Family History: The patient's family history includes Melanoma in her mother; Skin cancer in her father. ROS:   Please see the history of present illness.    All other systems reviewed and are negative.  EKGs/Labs/Other Studies Reviewed:    The following studies were reviewed today:  EKG:  EKG ordered today and personally reviewed.  The ekg ordered today demonstrates sinus rhythm low voltage no ischemic changes  Recent Labs: 07/31/2018: NT-Pro BNP 163; TSH 3.100 09/11/2018: BUN 20; Creatinine, Ser 1.09; Potassium 3.5; Sodium 140  Recent Lipid Panel No results found for: CHOL, TRIG, HDL, CHOLHDL, VLDL, LDLCALC, LDLDIRECT  Physical Exam:    VS:  BP 132/64 (BP Location: Right Arm, Patient Position: Sitting, Cuff Size: Large)    Pulse 66    Temp 98.4 F (36.9 C)    Ht 5\' 2"  (1.575 m)    Wt (!) 304 lb (137.9 kg)    SpO2 94%    BMI 55.60 kg/m     Wt Readings from Last 3 Encounters:  10/03/18 (!) 304 lb (137.9 kg)  09/12/18 (!) 301 lb 1.9 oz (136.6 kg)  08/24/18 288 lb (130.6 kg)     GEN: Morbidly obese quite anxious audible wheezing without a stethoscope well nourished, well developed in no acute distress HEENT: Normal NECK: No JVD; No carotid bruits LYMPHATICS: No lymphadenopathy CARDIAC: Distant regular no gallop RRR, no murmurs, rubs, gallops RESPIRATORY: Diminished breath sounds prolonged expiration wheezing no rales ABDOMEN: Soft, non-tender, non-distended MUSCULOSKELETAL: She has 2-3+ lower extremity edema bilateral as well as abdominal wall edema; No deformity  SKIN: Warm and dry NEUROLOGIC:  Alert and oriented x 3 PSYCHIATRIC: She is very apprehensive tells me she wonders if she will improve   Signed, Shirlee More, MD  10/03/2018 3:56 PM    [place]

## 2018-10-03 ENCOUNTER — Other Ambulatory Visit: Payer: Self-pay

## 2018-10-03 ENCOUNTER — Ambulatory Visit (INDEPENDENT_AMBULATORY_CARE_PROVIDER_SITE_OTHER): Payer: Medicare Other | Admitting: Cardiology

## 2018-10-03 VITALS — BP 132/64 | HR 66 | Temp 98.4°F | Ht 62.0 in | Wt 304.0 lb

## 2018-10-03 DIAGNOSIS — I5032 Chronic diastolic (congestive) heart failure: Secondary | ICD-10-CM

## 2018-10-03 DIAGNOSIS — R079 Chest pain, unspecified: Secondary | ICD-10-CM | POA: Diagnosis not present

## 2018-10-03 DIAGNOSIS — I130 Hypertensive heart and chronic kidney disease with heart failure and stage 1 through stage 4 chronic kidney disease, or unspecified chronic kidney disease: Secondary | ICD-10-CM | POA: Diagnosis not present

## 2018-10-03 HISTORY — DX: Chronic diastolic (congestive) heart failure: I50.32

## 2018-10-03 MED ORDER — METOLAZONE 5 MG PO TABS: 2.5000 mg | ORAL_TABLET | Freq: Every day | ORAL | 1 refills | Status: DC

## 2018-10-03 MED ORDER — ALBUTEROL SULFATE HFA 108 (90 BASE) MCG/ACT IN AERS: 2.0000 | INHALATION_SPRAY | RESPIRATORY_TRACT | 2 refills | Status: DC | PRN

## 2018-10-03 NOTE — Patient Instructions (Signed)
Medication Instructions:  Your physician has recommended you make the following change in your medication:   START: Metalozone 5 mg: Take 1/2 tab daily 1/2 hour prior to taking torsemide  If you need a refill on your cardiac medications before your next appointment, please call your pharmacy.   Lab work: None If you have labs (blood work) drawn today and your tests are completely normal, you will receive your results only by: Marland Kitchen MyChart Message (if you have MyChart) OR . A paper copy in the mail If you have any lab test that is abnormal or we need to change your treatment, we will call you to review the results.  Testing/Procedures: none  Follow-Up: At [facility], you and your health needs are our priority.  As part of our continuing mission to provide you with exceptional heart care, we have created designated Provider Care Teams.  These Care Teams include your primary Cardiologist (physician) and Advanced Practice Providers (APPs -  Physician Assistants and Nurse Practitioners) who all work together to provide you with the care you need, when you need it. You will need a follow up appointment in 1 weeks.  Any Other Special Instructions Will Be Listed Below (If Applicable).

## 2018-10-04 LAB — BASIC METABOLIC PANEL
BUN/Creatinine Ratio: 17 (ref 12–28)
BUN: 18 mg/dL (ref 8–27)
CO2: 41 mmol/L (ref 20–29)
Calcium: 9.1 mg/dL (ref 8.7–10.3)
Chloride: 91 mmol/L — ABNORMAL LOW (ref 96–106)
Creatinine, Ser: 1.04 mg/dL — ABNORMAL HIGH (ref 0.57–1.00)
GFR calc Af Amer: 62 mL/min/{1.73_m2} (ref >59)
GFR calc non Af Amer: 54 mL/min/{1.73_m2} — ABNORMAL LOW (ref >59)
Glucose: 132 mg/dL — ABNORMAL HIGH (ref 65–99)
Potassium: 4 mmol/L (ref 3.5–5.2)
Sodium: 146 mmol/L — ABNORMAL HIGH (ref 134–144)

## 2018-10-04 LAB — PRO B NATRIURETIC PEPTIDE: NT-Pro BNP: 319 pg/mL — ABNORMAL HIGH (ref 0–301)

## 2018-10-10 ENCOUNTER — Other Ambulatory Visit: Payer: Self-pay

## 2018-10-10 ENCOUNTER — Ambulatory Visit (INDEPENDENT_AMBULATORY_CARE_PROVIDER_SITE_OTHER): Payer: Medicare Other | Admitting: Cardiology

## 2018-10-10 VITALS — BP 120/62 | HR 64 | Temp 96.8°F | Ht 62.0 in | Wt 279.1 lb

## 2018-10-10 DIAGNOSIS — R079 Chest pain, unspecified: Secondary | ICD-10-CM

## 2018-10-10 DIAGNOSIS — I130 Hypertensive heart and chronic kidney disease with heart failure and stage 1 through stage 4 chronic kidney disease, or unspecified chronic kidney disease: Secondary | ICD-10-CM | POA: Diagnosis not present

## 2018-10-10 DIAGNOSIS — I5032 Chronic diastolic (congestive) heart failure: Secondary | ICD-10-CM

## 2018-10-10 MED ORDER — NITROGLYCERIN 0.4 MG SL SUBL: 0.4000 mg | SUBLINGUAL_TABLET | SUBLINGUAL | 3 refills | Status: DC | PRN

## 2018-10-10 NOTE — Patient Instructions (Signed)
Medication Instructions:  STOP: METALOZONE DECREASE: CARVEDILOL 6.25 mg to ONCE DAILY START: NITROGLYCERIN 0.4mg  AS NEEDED for CHEST PAIN  If you need a refill on your cardiac medications before your next appointment, please call your pharmacy.   Lab work:  CMP PRO BNP CBC                  Nitroglycerin sublingual tablets What is this medicine? NITROGLYCERIN (nye troe GLI ser in) is a type of vasodilator. It relaxes blood vessels, increasing the blood and oxygen supply to your heart. This medicine is used to relieve chest pain caused by angina. It is also used to prevent chest pain before activities like climbing stairs, going outdoors in cold weather, or sexual activity. This medicine may be used for other purposes; ask your health care provider or pharmacist if you have questions. COMMON BRAND NAME(S): Nitroquick, Nitrostat, Nitrotab What should I tell my health care provider before I take this medicine? They need to know if you have any of these conditions:  anemia  head injury, recent stroke, or bleeding in the brain  liver disease  previous heart attack  an unusual or allergic reaction to nitroglycerin, other medicines, foods, dyes, or preservatives  pregnant or trying to get pregnant  breast-feeding How should I use this medicine? Take this medicine by mouth as needed. At the first sign of an angina attack (chest pain or tightness) place one tablet under your tongue. You can also take this medicine 5 to 10 minutes before an event likely to produce chest pain. Follow the directions on the prescription label. Let the tablet dissolve under the tongue. Do not swallow whole. Replace the dose if you accidentally swallow it. It will help if your mouth is not dry. Saliva around the tablet will help it to dissolve more quickly. Do not eat or drink, smoke or chew tobacco while a tablet is dissolving. If you are not better within 5 minutes after taking ONE dose of nitroglycerin, call  9-1-1 immediately to seek emergency medical care. Do not take more than 3 nitroglycerin tablets over 15 minutes. If you take this medicine often to relieve symptoms of angina, your doctor or health care professional may provide you with different instructions to manage your symptoms. If symptoms do not go away after following these instructions, it is important to call 9-1-1 immediately. Do not take more than 3 nitroglycerin tablets over 15 minutes. Talk to your pediatrician regarding the use of this medicine in children. Special care may be needed. Overdosage: If you think you have taken too much of this medicine contact a poison control center or emergency room at once. NOTE: This medicine is only for you. Do not share this medicine with others. What if I miss a dose? This does not apply. This medicine is only used as needed. What may interact with this medicine? Do not take this medicine with any of the following medications:  certain migraine medicines like ergotamine and dihydroergotamine (DHE)  medicines used to treat erectile dysfunction like sildenafil, tadalafil, and vardenafil  riociguat This medicine may also interact with the following medications:  alteplase  aspirin  heparin  medicines for high blood pressure  medicines for mental depression  other medicines used to treat angina  phenothiazines like chlorpromazine, mesoridazine, prochlorperazine, thioridazine This list may not describe all possible interactions. Give your health care provider a list of all the medicines, herbs, non-prescription drugs, or dietary supplements you use. Also tell them if you smoke, drink  alcohol, or use illegal drugs. Some items may interact with your medicine. What should I watch for while using this medicine? Tell your doctor or health care professional if you feel your medicine is no longer working. Keep this medicine with you at all times. Sit or lie down when you take your medicine to  prevent falling if you feel dizzy or faint after using it. Try to remain calm. This will help you to feel better faster. If you feel dizzy, take several deep breaths and lie down with your feet propped up, or bend forward with your head resting between your knees. You may get drowsy or dizzy. Do not drive, use machinery, or do anything that needs mental alertness until you know how this drug affects you. Do not stand or sit up quickly, especially if you are an older patient. This reduces the risk of dizzy or fainting spells. Alcohol can make you more drowsy and dizzy. Avoid alcoholic drinks. Do not treat yourself for coughs, colds, or pain while you are taking this medicine without asking your doctor or health care professional for advice. Some ingredients may increase your blood pressure. What side effects may I notice from receiving this medicine? Side effects that you should report to your doctor or health care professional as soon as possible:  blurred vision  dry mouth  skin rash  sweating  the feeling of extreme pressure in the head  unusually weak or tired Side effects that usually do not require medical attention (report to your doctor or health care professional if they continue or are bothersome):  flushing of the face or neck  headache  irregular heartbeat, palpitations  nausea, vomiting This list may not describe all possible side effects. Call your doctor for medical advice about side effects. You may report side effects to FDA at 1-800-FDA-1088. Where should I keep my medicine? Keep out of the reach of children. Store at room temperature between 20 and 25 degrees C (68 and 77 degrees F). Store in [facility] of Staff. Protect from light and moisture. Keep tightly closed. Throw away any unused medicine after the expiration date. NOTE: This sheet is a summary. It may not cover all possible information. If you have questions about this medicine, talk to your doctor, pharmacist,  or health care provider.  2020 Elsevier/Gold Standard (2013-01-04 17:57:36)  CMP CBC PRO BNP If you have labs (blood work) drawn today and your tests are completely normal, you will receive your results only by: Marland Kitchen MyChart Message (if you have MyChart) OR . A paper copy in the mail If you have any lab test that is abnormal or we need to change your treatment, we will call you to review the results.  Testing/Procedures: NONE  Follow-Up: At [facility], you and your health needs are our priority.  As part of our continuing mission to provide you with exceptional heart care, we have created designated Provider Care Teams.  These Care Teams include your primary Cardiologist (physician) and Advanced Practice Providers (APPs -  Physician Assistants and Nurse Practitioners) who all work together to provide you with the care you need, when you need it. . You will need a follow up appointment in 4 weeks.   Any Other Special Instructions Will Be Listed Below (If Applicable).

## 2018-10-10 NOTE — Progress Notes (Signed)
Cardiology Office Note:    Date:  10/10/2018   ID:  Donna, Shaffer 1946-01-06, MRN [id number]  PCP:  Myrlene Broker, MD  Cardiologist:  Shirlee More, MD    Referring MD: Myrlene Broker, MD    ASSESSMENT:    1. Chronic diastolic heart failure ([place])   2. Hypertensive heart and chronic kidney disease with heart failure and stage 1 through stage 4 chronic kidney disease, or chronic kidney disease (HCC)   3. Chest pain, unspecified type    PLAN:    In order of problems listed above:  1. Improved discontinue metolazone continue torsemide check renal function proBNP level 2. Stable reduce beta-blocker once daily resting heart rate 64 recheck labs after profound diuresis stop metolazone 3. Quite atypical for now avoid coronary angiography reassess at next office visit given a prescription for nitroglycerin if she has typical angina   Next appointment: 1 month   Medication Adjustments/Labs and Tests Ordered: Current medicines are reviewed at length with the patient today.  Concerns regarding medicines are outlined above.  No orders of the defined types were placed in this encounter.  No orders of the defined types were placed in this encounter.   Chief Complaint  Patient presents with  . Congestive Heart Failure    History of Present Illness:    Donna Shaffer is a 73 y.o. female with a hx of diastolic heart failure, hypertension, Type 2 DM  and chest pain with a normal myocardial perfusion study at [facility] 09/19/2018 who was last seen 10/03/2018 and decompensated congestive heart failure. She was admitted to [facility] 09/17/2018 I reviewed the records she was short of breath they did a CTA of her chest and showed no pulmonary embolism they suspected that she had sleep apnea tried BiPAP and she could not tolerate it she was sent home on oxygen. She has told her problem is heart failure but her proBNP level was low at 238 and the  ejection  fraction was normal and she had mild aortic stenosis and aortic regurgitation. She received some IV diuretic she had diuresis but her creatinine rose after the contrast study and IV diuretics and she was discharged home.   Compliance with diet, lifestyle and medications: Yes  Fortunately family history mother she has lost 1 excess of 20 pounds her breathing is improved her edema has auditory all but resolved and she has no orthopnea.  Her chest pressure is resolved and she has little soreness along the left costochondral junction no anginal discomfort.  With her profound diuresis will recheck renal function her chest pain is quite atypical and I would like to prefer to avoid coronary angiography if possible because of her obesity posturing and CKD.  Will discontinue metolazone reduce carvedilol to once daily and see back in the office in 4 weeks I gave her a prescription for nitroglycerin if she has typical angina. Past Medical History:  Diagnosis Date  . Anxiety   . Breast cancer ([place])    left  . Cancer ([place])   . Colon polyps   . Depression   . Hypertension   . Personal history of radiation therapy   . Radiation 04/09/15-05/23/15   left breast 50.4 gray, lumpectomy cavity boost 10 gray    Past Surgical History:  Procedure Laterality Date  . ABDOMINAL HYSTERECTOMY    . APPENDECTOMY    . BILATERAL OOPHORECTOMY    . BREAST LUMPECTOMY Left 02/2015  . CATARACT EXTRACTION Bilateral   .  CHOLECYSTECTOMY    . RADIOACTIVE SEED GUIDED PARTIAL MASTECTOMY WITH AXILLARY SENTINEL LYMPH NODE BIOPSY Left 02/28/2015   Procedure: RADIOACTIVE SEED GUIDED PARTIAL MASTECTOMY WITH AXILLARY SENTINEL LYMPH NODE BIOPSY;  Surgeon: Autumn Messing III, MD;  Location: [place];  Service: General;  Laterality: Left;    Current Medications: Current Meds  Medication Sig  . Adalimumab 40 MG/0.4ML PSKT Inject 40 mg into the skin every 14 (fourteen) days.  Marland Kitchen albuterol (VENTOLIN HFA) 108 (90 Base) MCG/ACT  inhaler Inhale 2 puffs into the lungs every 4 (four) hours as needed.  . budesonide-formoterol (SYMBICORT) 160-4.5 MCG/ACT inhaler Inhale 2 puffs into the lungs 2 (two) times a day.  . calcium carbonate (CALCIUM 600) 600 MG TABS tablet Take 1 tablet by mouth daily.  . carvedilol (COREG) 6.25 MG tablet Take 6.25 mg by mouth 2 (two) times a day.  . cholecalciferol (VITAMIN D) 1000 units tablet Take 1 tablet (1,000 Units total) by mouth daily.  . citalopram (CELEXA) 40 MG tablet Take 40 mg by mouth daily.  . fluticasone (FLONASE) 50 MCG/ACT nasal spray Place 1 spray into the nose daily.  . hydrALAZINE (APRESOLINE) 25 MG tablet Take 1 tablet (25 mg total) by mouth 2 (two) times a day.  . hydrOXYzine (ATARAX/VISTARIL) 25 MG tablet Take 25 mg by mouth daily as needed.   . metolazone (ZAROXOLYN) 5 MG tablet Take 0.5 tablets (2.5 mg total) by mouth daily. Take 1/2 hour prior to torsemide  . OXYGEN Inhale 3 L into the lungs continuous.  . pantoprazole (PROTONIX) 40 MG tablet Take 40 mg by mouth daily. Reported on 07/03/2015  . potassium chloride SA (KLOR-CON M20) 20 MEQ tablet Take 1 tablet (20 mEq total) by mouth daily.  Marland Kitchen torsemide (DEMADEX) 20 MG tablet Take 1 tablet (20 mg total) by mouth 2 (two) times daily.  . traMADol (ULTRAM) 50 MG tablet TK 1 T PO Q 6 H PRN P  . triamcinolone cream (KENALOG) 0.5 % Apply topically 2 (two) times daily.     Allergies:   Ace inhibitors and Sulfa antibiotics   Social History   Socioeconomic History  . Marital status: Single    Spouse name: Not on file  . Number of children: 2  . Years of education: Not on file  . Highest education level: Not on file  Occupational History  . Occupation: Proofreader  Social Needs  . Financial resource strain: Not on file  . Food insecurity    Worry: Not on file    Inability: Not on file  . Transportation needs    Medical: Not on file    Non-medical: Not on file  Tobacco Use  . Smoking status: Never Smoker  .  Smokeless tobacco: Never Used  Substance and Sexual Activity  . Alcohol use: No  . Drug use: No  . Sexual activity: Not on file  Lifestyle  . Physical activity    Days per week: Not on file    Minutes per session: Not on file  . Stress: Not on file  Relationships  . Social Herbalist on phone: Not on file    Gets together: Not on file    Attends religious service: Not on file    Active member of club or organization: Not on file    Attends meetings of clubs or organizations: Not on file    Relationship status: Not on file  Other Topics Concern  . Not on file  Social History  Narrative  . Not on file     Family History: The patient's family history includes Melanoma in her mother; Skin cancer in her father. ROS:   Please see the history of present illness.    All other systems reviewed and are negative.  EKGs/Labs/Other Studies Reviewed:    The following studies were reviewed today:     Recent Labs: 07/31/2018: TSH 3.100 10/03/2018: BUN 18; Creatinine, Ser 1.04; NT-Pro BNP 319; Potassium 4.0; Sodium 146  Recent Lipid Panel No results found for: CHOL, TRIG, HDL, CHOLHDL, VLDL, LDLCALC, LDLDIRECT  Physical Exam:    VS:  BP 120/62 (BP Location: Right Arm, Patient Position: Sitting, Cuff Size: Large)   Pulse 64   Temp (!) 96.8 F (36 C)   Ht 5\' 2"  (1.575 m)   Wt 279 lb 1.9 oz (126.6 kg)   SpO2 92%   BMI 51.05 kg/m     Wt Readings from Last 3 Encounters:  10/10/18 279 lb 1.9 oz (126.6 kg)  10/03/18 (!) 304 lb (137.9 kg)  09/12/18 (!) 301 lb 1.9 oz (136.6 kg)     GEN: There is been a marked improvement she is not short of breath well nourished, well developed in no acute distress HEENT: Normal NECK: No JVD; No carotid bruits LYMPHATICS: No lymphadenopathy CARDIAC: Tenderness along the left costochondral junction reproduces her symptoms RRR, no murmurs, rubs, gallops RESPIRATORY:  Clear to auscultation without rales, wheezing or rhonchi  ABDOMEN: Soft,  non-tender, non-distended MUSCULOSKELETAL:  No edema; No deformity  SKIN: Warm and dry NEUROLOGIC:  Alert and oriented x 3 PSYCHIATRIC:  Normal affect    Signed, Shirlee More, MD  10/10/2018 2:59 PM    [place]

## 2018-10-11 ENCOUNTER — Telehealth: Payer: Self-pay

## 2018-10-11 DIAGNOSIS — I5032 Chronic diastolic (congestive) heart failure: Secondary | ICD-10-CM

## 2018-10-11 DIAGNOSIS — I130 Hypertensive heart and chronic kidney disease with heart failure and stage 1 through stage 4 chronic kidney disease, or unspecified chronic kidney disease: Secondary | ICD-10-CM

## 2018-10-11 DIAGNOSIS — E876 Hypokalemia: Secondary | ICD-10-CM

## 2018-10-11 LAB — CBC
Hematocrit: 44.1 % (ref 34.0–46.6)
Hemoglobin: 15 g/dL (ref 11.1–15.9)
MCH: 32.5 pg (ref 26.6–33.0)
MCHC: 34 g/dL (ref 31.5–35.7)
MCV: 96 fL (ref 79–97)
Platelets: 258 10*3/uL (ref 150–450)
RBC: 4.62 x10E6/uL (ref 3.77–5.28)
RDW: 12.2 % (ref 11.7–15.4)
WBC: 9 10*3/uL (ref 3.4–10.8)

## 2018-10-11 LAB — COMPREHENSIVE METABOLIC PANEL
ALT: 72 IU/L — ABNORMAL HIGH (ref 0–32)
AST: 106 IU/L — ABNORMAL HIGH (ref 0–40)
Albumin/Globulin Ratio: 1.4 (ref 1.2–2.2)
Albumin: 4.2 g/dL (ref 3.7–4.7)
Alkaline Phosphatase: 110 IU/L (ref 39–117)
BUN/Creatinine Ratio: 24 (ref 12–28)
BUN: 36 mg/dL — ABNORMAL HIGH (ref 8–27)
Bilirubin Total: 0.5 mg/dL (ref 0.0–1.2)
CO2: 46 mmol/L (ref 20–29)
Calcium: 10.3 mg/dL (ref 8.7–10.3)
Chloride: 73 mmol/L — CL (ref 96–106)
Creatinine, Ser: 1.52 mg/dL — ABNORMAL HIGH (ref 0.57–1.00)
GFR calc Af Amer: 39 mL/min/{1.73_m2} — ABNORMAL LOW (ref >59)
GFR calc non Af Amer: 34 mL/min/{1.73_m2} — ABNORMAL LOW (ref >59)
Globulin, Total: 3 g/dL (ref 1.5–4.5)
Glucose: 161 mg/dL — ABNORMAL HIGH (ref 65–99)
Potassium: 2.9 mmol/L — ABNORMAL LOW (ref 3.5–5.2)
Sodium: 139 mmol/L (ref 134–144)
Total Protein: 7.2 g/dL (ref 6.0–8.5)

## 2018-10-11 LAB — PRO B NATRIURETIC PEPTIDE: NT-Pro BNP: 134 pg/mL (ref 0–301)

## 2018-10-11 MED ORDER — POTASSIUM CHLORIDE CRYS ER 20 MEQ PO TBCR: 20.0000 meq | EXTENDED_RELEASE_TABLET | Freq: Two times a day (BID) | ORAL | 1 refills | Status: DC

## 2018-10-11 NOTE — Telephone Encounter (Signed)
[name] returned call and was advised that patient's potassium was low.  [name] advised to have patient increase potassium 56meq to twice daily today and tomorrow and recheck BMP on Friday in [place] office.  [name] verbalized understanding and agreed to plan.  NO further questions.

## 2018-10-11 NOTE — Telephone Encounter (Signed)
Called patient's home phone but there was no answer and voicemail was full-unable to leave a message.  Contact patient's daughter Donna Shaffer and left a voicemail on cell phone to return call as soon as possible for lab results. Will continue efforts.

## 2018-10-14 LAB — BASIC METABOLIC PANEL
BUN/Creatinine Ratio: 25 (ref 12–28)
BUN: 38 mg/dL — ABNORMAL HIGH (ref 8–27)
CO2: 49 mmol/L (ref 20–29)
Calcium: 9.9 mg/dL (ref 8.7–10.3)
Chloride: 74 mmol/L — CL (ref 96–106)
Creatinine, Ser: 1.54 mg/dL — ABNORMAL HIGH (ref 0.57–1.00)
GFR calc Af Amer: 39 mL/min/{1.73_m2} — ABNORMAL LOW (ref >59)
GFR calc non Af Amer: 33 mL/min/{1.73_m2} — ABNORMAL LOW (ref >59)
Glucose: 189 mg/dL — ABNORMAL HIGH (ref 65–99)
Potassium: 2.8 mmol/L — ABNORMAL LOW (ref 3.5–5.2)
Sodium: 139 mmol/L (ref 134–144)

## 2018-10-16 ENCOUNTER — Telehealth: Payer: Self-pay

## 2018-10-16 DIAGNOSIS — I130 Hypertensive heart and chronic kidney disease with heart failure and stage 1 through stage 4 chronic kidney disease, or unspecified chronic kidney disease: Secondary | ICD-10-CM

## 2018-10-16 DIAGNOSIS — E876 Hypokalemia: Secondary | ICD-10-CM

## 2018-10-16 NOTE — Telephone Encounter (Signed)
[name], patient's daughter notified of lab results.  [name] advised that patient needs to have repeat lab work done tomorrow as soon as possible per Dr Bettina Gavia.  [name] does not wish to take patient to [facility] to have labs drawn. She will bring patient to our office tomorrow on 10-17-2018 to have BMP.  Patient's daughter agreed to plan and verbalized understanding.

## 2018-10-17 ENCOUNTER — Other Ambulatory Visit: Payer: Self-pay | Admitting: Cardiology

## 2018-10-17 LAB — BASIC METABOLIC PANEL
BUN/Creatinine Ratio: 28 (ref 12–28)
BUN: 45 mg/dL — ABNORMAL HIGH (ref 8–27)
CO2: 43 mmol/L (ref 20–29)
Calcium: 10.1 mg/dL (ref 8.7–10.3)
Chloride: 75 mmol/L — ABNORMAL LOW (ref 96–106)
Creatinine, Ser: 1.6 mg/dL — ABNORMAL HIGH (ref 0.57–1.00)
GFR calc Af Amer: 37 mL/min/{1.73_m2} — ABNORMAL LOW (ref >59)
GFR calc non Af Amer: 32 mL/min/{1.73_m2} — ABNORMAL LOW (ref >59)
Glucose: 216 mg/dL — ABNORMAL HIGH (ref 65–99)
Potassium: 2.7 mmol/L — ABNORMAL LOW (ref 3.5–5.2)
Sodium: 140 mmol/L (ref 134–144)

## 2018-10-17 MED ORDER — CARVEDILOL 6.25 MG PO TABS: 6.2500 mg | ORAL_TABLET | Freq: Every day | ORAL | 0 refills | Status: DC

## 2018-10-17 NOTE — Addendum Note (Signed)
Addended by: Stevan Born on: 10/17/2018 12:39 PM   Modules accepted: Orders

## 2018-10-17 NOTE — Telephone Encounter (Signed)
Rx for carvedilol sent to [facility] as requested.

## 2018-10-17 NOTE — Telephone Encounter (Signed)
°*  STAT* If patient is at the pharmacy, call can be transferred to refill team.   1. Which medications need to be refilled? (please list name of each medication and dose if known) carvedilol  2. Which pharmacy/location (including street and city if local pharmacy) is medication to be sent to? [facility] on [address]  3. Do they need a 30 day or 90 day supply? [place]

## 2018-10-18 ENCOUNTER — Inpatient Hospital Stay (HOSPITAL_BASED_OUTPATIENT_CLINIC_OR_DEPARTMENT_OTHER)
Admission: EM | Admit: 2018-10-18 | Discharge: 2018-10-22 | DRG: 640 | Disposition: A | Discharge status: Home Health | Payer: Medicare Other | Attending: Family Medicine | Admitting: Family Medicine

## 2018-10-18 ENCOUNTER — Encounter (HOSPITAL_BASED_OUTPATIENT_CLINIC_OR_DEPARTMENT_OTHER): Payer: Self-pay

## 2018-10-18 ENCOUNTER — Other Ambulatory Visit: Payer: Self-pay | Admitting: Family Medicine

## 2018-10-18 ENCOUNTER — Telehealth: Payer: Self-pay | Admitting: *Deleted

## 2018-10-18 ENCOUNTER — Other Ambulatory Visit: Payer: Self-pay

## 2018-10-18 DIAGNOSIS — N179 Acute kidney failure, unspecified: Secondary | ICD-10-CM | POA: Diagnosis present

## 2018-10-18 DIAGNOSIS — R0902 Hypoxemia: Secondary | ICD-10-CM

## 2018-10-18 DIAGNOSIS — J9621 Acute and chronic respiratory failure with hypoxia: Secondary | ICD-10-CM | POA: Diagnosis present

## 2018-10-18 DIAGNOSIS — Z9981 Dependence on supplemental oxygen: Secondary | ICD-10-CM | POA: Diagnosis not present

## 2018-10-18 DIAGNOSIS — R059 Cough, unspecified: Secondary | ICD-10-CM

## 2018-10-18 DIAGNOSIS — I130 Hypertensive heart and chronic kidney disease with heart failure and stage 1 through stage 4 chronic kidney disease, or unspecified chronic kidney disease: Secondary | ICD-10-CM | POA: Diagnosis present

## 2018-10-18 DIAGNOSIS — E873 Alkalosis: Secondary | ICD-10-CM | POA: Diagnosis not present

## 2018-10-18 DIAGNOSIS — E1169 Type 2 diabetes mellitus with other specified complication: Secondary | ICD-10-CM

## 2018-10-18 DIAGNOSIS — Z6841 Body Mass Index (BMI) 40.0 and over, adult: Secondary | ICD-10-CM | POA: Diagnosis not present

## 2018-10-18 DIAGNOSIS — I5032 Chronic diastolic (congestive) heart failure: Secondary | ICD-10-CM | POA: Diagnosis present

## 2018-10-18 DIAGNOSIS — R748 Abnormal levels of other serum enzymes: Secondary | ICD-10-CM

## 2018-10-18 DIAGNOSIS — R0600 Dyspnea, unspecified: Secondary | ICD-10-CM

## 2018-10-18 DIAGNOSIS — I10 Essential (primary) hypertension: Secondary | ICD-10-CM

## 2018-10-18 DIAGNOSIS — N183 Chronic kidney disease, stage 3 (moderate): Secondary | ICD-10-CM | POA: Diagnosis present

## 2018-10-18 DIAGNOSIS — K219 Gastro-esophageal reflux disease without esophagitis: Secondary | ICD-10-CM | POA: Diagnosis present

## 2018-10-18 DIAGNOSIS — Z882 Allergy status to sulfonamides status: Secondary | ICD-10-CM | POA: Diagnosis not present

## 2018-10-18 DIAGNOSIS — Z808 Family history of malignant neoplasm of other organs or systems: Secondary | ICD-10-CM | POA: Diagnosis not present

## 2018-10-18 DIAGNOSIS — E662 Morbid (severe) obesity with alveolar hypoventilation: Secondary | ICD-10-CM | POA: Diagnosis present

## 2018-10-18 DIAGNOSIS — R05 Cough: Secondary | ICD-10-CM

## 2018-10-18 DIAGNOSIS — Z20828 Contact with and (suspected) exposure to other viral communicable diseases: Secondary | ICD-10-CM | POA: Diagnosis present

## 2018-10-18 DIAGNOSIS — E876 Hypokalemia: Principal | ICD-10-CM

## 2018-10-18 DIAGNOSIS — Z888 Allergy status to other drugs, medicaments and biological substances status: Secondary | ICD-10-CM | POA: Diagnosis not present

## 2018-10-18 DIAGNOSIS — Z923 Personal history of irradiation: Secondary | ICD-10-CM | POA: Diagnosis not present

## 2018-10-18 DIAGNOSIS — Z7951 Long term (current) use of inhaled steroids: Secondary | ICD-10-CM | POA: Diagnosis not present

## 2018-10-18 DIAGNOSIS — E1122 Type 2 diabetes mellitus with diabetic chronic kidney disease: Secondary | ICD-10-CM | POA: Diagnosis present

## 2018-10-18 DIAGNOSIS — Z853 Personal history of malignant neoplasm of breast: Secondary | ICD-10-CM | POA: Diagnosis not present

## 2018-10-18 HISTORY — DX: Hypokalemia: E87.6

## 2018-10-18 HISTORY — DX: Pneumonia, unspecified organism: J18.9

## 2018-10-18 LAB — BASIC METABOLIC PANEL
Anion gap: 15 (ref 5–15)
BUN: 43 mg/dL — ABNORMAL HIGH (ref 8–23)
CO2: 47 mmol/L — ABNORMAL HIGH (ref 22–32)
Calcium: 9.5 mg/dL (ref 8.9–10.3)
Chloride: 74 mmol/L — ABNORMAL LOW (ref 98–111)
Creatinine, Ser: 1.83 mg/dL — ABNORMAL HIGH (ref 0.44–1.00)
GFR calc Af Amer: 31 mL/min — ABNORMAL LOW (ref >60)
GFR calc non Af Amer: 27 mL/min — ABNORMAL LOW (ref >60)
Glucose, Bld: 215 mg/dL — ABNORMAL HIGH (ref 70–99)
Potassium: 2.4 mmol/L — CL (ref 3.5–5.1)
Sodium: 136 mmol/L (ref 135–145)

## 2018-10-18 LAB — SARS CORONAVIRUS 2 BY RT PCR (HOSPITAL ORDER, PERFORMED IN ~~LOC~~ HOSPITAL LAB): SARS Coronavirus 2: NEGATIVE

## 2018-10-18 LAB — MAGNESIUM: Magnesium: 1.7 mg/dL (ref 1.7–2.4)

## 2018-10-18 LAB — CBC
HCT: 46.8 % — ABNORMAL HIGH (ref 36.0–46.0)
Hemoglobin: 15.5 g/dL — ABNORMAL HIGH (ref 12.0–15.0)
MCH: 31.6 pg (ref 26.0–34.0)
MCHC: 33.1 g/dL (ref 30.0–36.0)
MCV: 95.5 fL (ref 80.0–100.0)
Platelets: 220 10*3/uL (ref 150–400)
RBC: 4.9 MIL/uL (ref 3.87–5.11)
RDW: 12.4 % (ref 11.5–15.5)
WBC: 8.9 10*3/uL (ref 4.0–10.5)
nRBC: 0 % (ref 0.0–0.2)

## 2018-10-18 LAB — PHOSPHORUS: Phosphorus: 2.5 mg/dL (ref 2.5–4.6)

## 2018-10-18 MED ORDER — CALCIUM CARBONATE 600 MG PO TABS: 1.0000 | ORAL_TABLET | Freq: Every day | ORAL | Status: DC

## 2018-10-18 MED ORDER — POTASSIUM CHLORIDE 10 MEQ/100ML IV SOLN
10.0000 meq | Freq: Once | INTRAVENOUS | Status: AC
  Administered 2018-10-18: 10 meq via INTRAVENOUS
  Filled 2018-10-18: qty 100

## 2018-10-18 MED ORDER — SODIUM CHLORIDE 0.9 % IV SOLN
INTRAVENOUS | Status: DC | PRN
  Administered 2018-10-18 (×2): 1000 mL via INTRAVENOUS

## 2018-10-18 MED ORDER — HYDROCODONE-ACETAMINOPHEN 5-325 MG PO TABS
1.0000 | ORAL_TABLET | Freq: Once | ORAL | Status: AC
  Administered 2018-10-18: 1 via ORAL
  Filled 2018-10-18: qty 1

## 2018-10-18 MED ORDER — CITALOPRAM HYDROBROMIDE 20 MG PO TABS
40.0000 mg | ORAL_TABLET | Freq: Every day | ORAL | Status: DC
  Administered 2018-10-19: 40 mg via ORAL
  Filled 2018-10-18: qty 1
  Filled 2018-10-18: qty 2

## 2018-10-18 MED ORDER — ALBUTEROL SULFATE HFA 108 (90 BASE) MCG/ACT IN AERS
2.0000 | INHALATION_SPRAY | RESPIRATORY_TRACT | Status: DC | PRN
  Filled 2018-10-18: qty 6.7

## 2018-10-18 MED ORDER — CARVEDILOL 6.25 MG PO TABS
6.2500 mg | ORAL_TABLET | Freq: Every day | ORAL | Status: DC
  Administered 2018-10-19 – 2018-10-22 (×4): 6.25 mg via ORAL
  Filled 2018-10-18 (×5): qty 1

## 2018-10-18 MED ORDER — PANTOPRAZOLE SODIUM 40 MG PO TBEC
40.0000 mg | DELAYED_RELEASE_TABLET | Freq: Every day | ORAL | Status: DC
  Administered 2018-10-19: 40 mg via ORAL
  Filled 2018-10-18: qty 1

## 2018-10-18 MED ORDER — FLUTICASONE PROPIONATE 50 MCG/ACT NA SUSP
1.0000 | Freq: Every day | NASAL | Status: DC
  Filled 2018-10-18: qty 16

## 2018-10-18 MED ORDER — TRAMADOL HCL 50 MG PO TABS
50.0000 mg | ORAL_TABLET | Freq: Four times a day (QID) | ORAL | Status: DC | PRN
  Administered 2018-10-18 – 2018-10-21 (×8): 50 mg via ORAL
  Filled 2018-10-18 (×8): qty 1

## 2018-10-18 MED ORDER — MOMETASONE FURO-FORMOTEROL FUM 200-5 MCG/ACT IN AERO
2.0000 | INHALATION_SPRAY | Freq: Two times a day (BID) | RESPIRATORY_TRACT | Status: DC
  Administered 2018-10-19 – 2018-10-22 (×6): 2 via RESPIRATORY_TRACT
  Filled 2018-10-18 (×2): qty 8.8

## 2018-10-18 NOTE — Telephone Encounter (Signed)
Patient's daughter, [name] returned Catherine's call. Per Elmyra Ricks, I explained to [place] she needs to take mother to the ED at [place] to get IV  Potassium. I told her Ms. Granderson' potassium was very low at 2.7.  She agreed and will be taking patient to ED at [place]

## 2018-10-18 NOTE — Progress Notes (Signed)
This patient is a 73 yr old woman who was recently discharged from the hospital after a stay for exacerbation of CHF. She was discharged on bumex. For the past 4-5 days her PCP has been trying to manage the patient's worsening hypokalemia as outpatient with no success. The patient had labs drawn yesterday that demonstrated a potassium of 2.9 (2.4 today), Cloride of 74, Bicarb of 47, and an elevation of creatinine from 1.0 to 1.80. the patient will be coming to [facility] from [facility].

## 2018-10-18 NOTE — Telephone Encounter (Signed)
-----   Message from Richardo Priest, MD sent at 10/17/2018  4:56 PM EDT ----- I called at 455pm no answer and voice mail is not set up. Needs to go to ED for IV K

## 2018-10-18 NOTE — ED Provider Notes (Signed)
[facility] EMERGENCY DEPARTMENT Provider Note   CSN: [id number] Arrival date & time: 10/18/18  1455    History   Chief Complaint Chief Complaint  Patient presents with  . Abnormal Lab    HPI Donna Shaffer is a 73 y.o. female.     HPI Patient sent in from cardiology for hypokalemia.  Potassium 2.7 despite aggressive supplementation at home.  Has been decreasing over the last couple days.  History of CHF has had recent diuresis.  Has been down 20 pounds over the last month.  States she feels fatigued.  Will have muscle cramps at times.  States her breathing is somewhat improved compared to prior.  No abdominal pain.  States the swelling in her legs feels improved to.  No chest pain. Past Medical History:  Diagnosis Date  . Anxiety   . Breast cancer ([place])    left  . Cancer ([place])   . Colon polyps   . Depression   . Hypertension   . Personal history of radiation therapy   . Pneumonia   . Radiation 04/09/15-05/23/15   left breast 50.4 gray, lumpectomy cavity boost 10 gray    Patient Active Problem List   Diagnosis Date Noted  . Chronic diastolic heart failure ([place]) 10/03/2018  . Abnormal chest x-ray 12/20/2017  . 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 Chest pain 12/14/2017  . SOB (shortness of breath) on exertion 12/14/2017  . GERD without esophagitis 11/24/2016  . Type 2 diabetes mellitus with hyperglycemia, without long-term current use of insulin ([place]) 11/24/2016  . Obesity, morbid, BMI 40.0-49.9 ([place]) 08/26/2015  . Breast cancer of upper-outer quadrant of left female breast ([place]) 03/11/2015    Past Surgical History:  Procedure Laterality Date  . ABDOMINAL HYSTERECTOMY    . APPENDECTOMY    . BILATERAL OOPHORECTOMY    . BREAST LUMPECTOMY Left 02/2015  . CATARACT EXTRACTION Bilateral   . CHOLECYSTECTOMY    . RADIOACTIVE SEED  GUIDED PARTIAL MASTECTOMY WITH AXILLARY SENTINEL LYMPH NODE BIOPSY Left 02/28/2015   Procedure: RADIOACTIVE SEED GUIDED PARTIAL MASTECTOMY WITH AXILLARY SENTINEL LYMPH NODE BIOPSY;  Surgeon: Autumn Messing III, MD;  Location: [facility];  Service: General;  Laterality: Left;     OB History   No obstetric history on file.      Home Medications    Prior to Admission medications   Medication Sig Start Date End Date Taking? Authorizing Provider  Adalimumab 40 MG/0.4ML PSKT Inject 40 mg into the skin every 14 (fourteen) days.    [provider]  albuterol (VENTOLIN HFA) 108 (90 Base) MCG/ACT inhaler Inhale 2 puffs into the lungs every 4 (four) hours as needed. 10/03/18   Richardo Priest, MD  budesonide-formoterol (SYMBICORT) 160-4.5 MCG/ACT inhaler Inhale 2 puffs into the lungs 2 (two) times a day. 09/27/18   [provider]  calcium carbonate (CALCIUM 600) 600 MG TABS tablet Take 1 tablet by mouth daily.    [provider]  carvedilol (COREG) 6.25 MG tablet Take 1 tablet (6.25 mg total) by mouth daily. 10/17/18   Richardo Priest, MD  cholecalciferol (VITAMIN D) 1000 units tablet Take 1 tablet (1,000 Units total) by mouth daily. 08/26/15   Magrinat, Virgie Dad, MD  citalopram (CELEXA) 40 MG tablet Take 40 mg by mouth daily.    [provider]  fluticasone (FLONASE) 50 MCG/ACT nasal  spray Place 1 spray into the nose daily. 12/28/17   [provider]  hydrALAZINE (APRESOLINE) 25 MG tablet Take 1 tablet (25 mg total) by mouth 2 (two) times a day. 08/24/18 11/22/18  Richardo Priest, MD  hydrOXYzine (ATARAX/VISTARIL) 25 MG tablet Take 25 mg by mouth daily as needed.  01/20/18   [provider]  nitroGLYCERIN (NITROSTAT) 0.4 MG SL tablet Place 1 tablet (0.4 mg total) under the tongue every 5 (five) minutes as needed for chest pain. 10/10/18 01/08/19  Richardo Priest, MD  OXYGEN Inhale 3 L into the lungs continuous.    [provider]   pantoprazole (PROTONIX) 40 MG tablet Take 40 mg by mouth daily. Reported on 07/03/2015    [provider]  potassium chloride SA (KLOR-CON M20) 20 MEQ tablet Take 1 tablet (20 mEq total) by mouth 2 (two) times daily. 10/11/18   Richardo Priest, MD  torsemide (DEMADEX) 20 MG tablet Take 1 tablet (20 mg total) by mouth 2 (two) times daily. 09/12/18 12/11/18  Richardo Priest, MD  traMADol (ULTRAM) 50 MG tablet TK 1 T PO Q 6 H PRN P 07/31/18   [provider]  triamcinolone cream (KENALOG) 0.5 % Apply topically 2 (two) times daily. 10/19/16   [provider]    Family History Family History  Problem Relation Age of Onset  . Melanoma Mother   . Skin cancer Father     Social History Social History   Tobacco Use  . Smoking status: Never Smoker  . Smokeless tobacco: Never Used  Substance Use Topics  . Alcohol use: No  . Drug use: No     Allergies   Ace inhibitors and Sulfa antibiotics   Review of Systems Review of Systems  Constitutional: Positive for fatigue. Negative for appetite change.  HENT: Negative for congestion.   Respiratory: Positive for shortness of breath.   Cardiovascular: Positive for leg swelling.  Gastrointestinal: Negative for abdominal pain.  Genitourinary: Negative for flank pain.  Musculoskeletal: Negative for back pain.  Skin: Negative for wound.  Neurological: Positive for weakness.  Psychiatric/Behavioral: Negative for confusion.     Physical Exam Updated Vital Signs BP (!) 152/74 (BP Location: Right Arm)   Pulse 64   Temp 98.7 F (37.1 C) (Oral)   Resp (!) 28   Ht 5\' 2"  (1.575 m)   Wt 123.4 kg   SpO2 99%   BMI 49.75 kg/m   Physical Exam Vitals signs and nursing note reviewed.  Constitutional:      Appearance: She is obese.  HENT:     Head: Atraumatic.  Eyes:     Pupils: Pupils are equal, round, and reactive to light.  Cardiovascular:     Rate and Rhythm: Normal rate and regular rhythm.  Pulmonary:     Breath  sounds: No rhonchi.  Abdominal:     Tenderness: There is no abdominal tenderness.  Musculoskeletal:     Right lower leg: Edema present.     Left lower leg: Edema present.     Comments: Some edema bilateral lower extremities.  Skin:    Capillary Refill: Capillary refill takes less than 2 seconds.  Neurological:     General: No focal deficit present.      ED Treatments / Results  Labs (all labs ordered are listed, but only abnormal results are displayed) Labs Reviewed  BASIC METABOLIC PANEL - Abnormal; Notable for the following components:      Result Value   Potassium  2.4 (*)    Chloride 74 (*)    CO2 47 (*)    Glucose, Bld 215 (*)    BUN 43 (*)    Creatinine, Ser 1.83 (*)    GFR calc non Af Amer 27 (*)    GFR calc Af Amer 31 (*)    All other components within normal limits  CBC - Abnormal; Notable for the following components:   Hemoglobin 15.5 (*)    HCT 46.8 (*)    All other components within normal limits  MAGNESIUM  PHOSPHORUS    EKG None  Radiology No results found.  Procedures Procedures (including critical care time)  Medications Ordered in ED Medications  0.9 %  sodium chloride infusion (1,000 mLs Intravenous New Bag/Given 10/18/18 1533)  potassium chloride 10 mEq in 100 mL IVPB (10 mEq Intravenous New Bag/Given 10/18/18 1534)     Initial Impression / Assessment and Plan / ED Course  I have reviewed the triage vital signs and the nursing notes.  Pertinent labs & imaging results that were available during my care of the patient were reviewed by me and considered in my medical decision making (see chart for details).        Patient sent in for hypokalemia.  Has been supplementing orally at home with PCP and cardiology.  However has continued worsening of her potassium.  Down to 2.4 compared to 2.7 yesterday.  Think this will not be able to be supplemented sufficiently in the ER.  Also has elevated bicarb and low chloride.  Will admit to hospitalist   Final Clinical Impressions(s) / ED Diagnoses   Final diagnoses:  Hypokalemia    ED Discharge Orders    None       Davonna Belling, MD 10/18/18 1644

## 2018-10-18 NOTE — ED Notes (Signed)
Pt provided meal

## 2018-10-18 NOTE — ED Notes (Signed)
Pt requesting pain medication for pain to hip and left knee. EDP informed., no new orders at this time.

## 2018-10-18 NOTE — Telephone Encounter (Signed)
Called patient to discuss lab results with no answer, unable to leave voicemail. Left message for patient's daughter, [name], to return call as soon as possible per DPR. Will continue efforts.

## 2018-10-18 NOTE — ED Notes (Signed)
Likely pt will be holding in ED for extended time- no admission orders at this time. EDP made aware.

## 2018-10-18 NOTE — ED Triage Notes (Signed)
Pt sent for Cards for low K+-pt to triage in w/c-NAD

## 2018-10-18 NOTE — ED Notes (Signed)
[name] (Daughter) [phone]

## 2018-10-18 NOTE — ED Notes (Signed)
Pt c/o knee and back pain- pain is chronic. Pt denies any chest pain/ SOB. Pt does wear home oxygen and gets SOB with exertion.

## 2018-10-18 NOTE — ED Notes (Signed)
Assisted pt to bathroom and returned to room. Pt given drink per request. Pt requesting pain medications. MD made aware.

## 2018-10-19 ENCOUNTER — Encounter (HOSPITAL_COMMUNITY): Payer: Self-pay | Admitting: Internal Medicine

## 2018-10-19 ENCOUNTER — Inpatient Hospital Stay (HOSPITAL_COMMUNITY): Payer: Medicare Other

## 2018-10-19 DIAGNOSIS — E876 Hypokalemia: Principal | ICD-10-CM

## 2018-10-19 DIAGNOSIS — I10 Essential (primary) hypertension: Secondary | ICD-10-CM

## 2018-10-19 HISTORY — DX: Essential (primary) hypertension: I10

## 2018-10-19 LAB — CBC
HCT: 41.5 % (ref 36.0–46.0)
Hemoglobin: 14 g/dL (ref 12.0–15.0)
MCH: 32.2 pg (ref 26.0–34.0)
MCHC: 33.7 g/dL (ref 30.0–36.0)
MCV: 95.4 fL (ref 80.0–100.0)
Platelets: 193 10*3/uL (ref 150–400)
RBC: 4.35 MIL/uL (ref 3.87–5.11)
RDW: 12.4 % (ref 11.5–15.5)
WBC: 8.5 10*3/uL (ref 4.0–10.5)
nRBC: 0 % (ref 0.0–0.2)

## 2018-10-19 LAB — BASIC METABOLIC PANEL
Anion gap: 15 (ref 5–15)
Anion gap: 15 (ref 5–15)
BUN: 43 mg/dL — ABNORMAL HIGH (ref 8–23)
BUN: 46 mg/dL — ABNORMAL HIGH (ref 8–23)
CO2: 45 mmol/L — ABNORMAL HIGH (ref 22–32)
CO2: 42 mmol/L — ABNORMAL HIGH (ref 22–32)
Calcium: 9.2 mg/dL (ref 8.9–10.3)
Calcium: 9.2 mg/dL (ref 8.9–10.3)
Chloride: 77 mmol/L — ABNORMAL LOW (ref 98–111)
Chloride: 80 mmol/L — ABNORMAL LOW (ref 98–111)
Creatinine, Ser: 1.75 mg/dL — ABNORMAL HIGH (ref 0.44–1.00)
Creatinine, Ser: 1.99 mg/dL — ABNORMAL HIGH (ref 0.44–1.00)
GFR calc Af Amer: 33 mL/min — ABNORMAL LOW (ref >60)
GFR calc Af Amer: 28 mL/min — ABNORMAL LOW (ref >60)
GFR calc non Af Amer: 29 mL/min — ABNORMAL LOW (ref >60)
GFR calc non Af Amer: 24 mL/min — ABNORMAL LOW (ref >60)
Glucose, Bld: 144 mg/dL — ABNORMAL HIGH (ref 70–99)
Glucose, Bld: 159 mg/dL — ABNORMAL HIGH (ref 70–99)
Potassium: 2.6 mmol/L — CL (ref 3.5–5.1)
Potassium: 3.7 mmol/L (ref 3.5–5.1)
Sodium: 137 mmol/L (ref 135–145)
Sodium: 137 mmol/L (ref 135–145)

## 2018-10-19 MED ORDER — NITROGLYCERIN 0.4 MG SL SUBL: 0.4000 mg | SUBLINGUAL_TABLET | SUBLINGUAL | Status: DC | PRN

## 2018-10-19 MED ORDER — CITALOPRAM HYDROBROMIDE 20 MG PO TABS
20.0000 mg | ORAL_TABLET | Freq: Every day | ORAL | Status: DC
  Administered 2018-10-20 – 2018-10-22 (×3): 20 mg via ORAL
  Filled 2018-10-19 (×3): qty 1

## 2018-10-19 MED ORDER — ONDANSETRON HCL 4 MG/2ML IJ SOLN: 4.0000 mg | Freq: Four times a day (QID) | INTRAMUSCULAR | Status: DC | PRN

## 2018-10-19 MED ORDER — HYDROXYZINE HCL 25 MG PO TABS
25.0000 mg | ORAL_TABLET | Freq: Every day | ORAL | Status: DC | PRN
  Administered 2018-10-19 – 2018-10-21 (×3): 25 mg via ORAL
  Filled 2018-10-19 (×3): qty 1

## 2018-10-19 MED ORDER — ONDANSETRON HCL 4 MG PO TABS
4.0000 mg | ORAL_TABLET | Freq: Four times a day (QID) | ORAL | Status: DC | PRN
  Administered 2018-10-19 – 2018-10-20 (×4): 4 mg via ORAL
  Filled 2018-10-19 (×4): qty 1

## 2018-10-19 MED ORDER — POTASSIUM CHLORIDE CRYS ER 20 MEQ PO TBCR
40.0000 meq | EXTENDED_RELEASE_TABLET | Freq: Once | ORAL | Status: AC
  Administered 2018-10-19: 40 meq via ORAL
  Filled 2018-10-19: qty 2

## 2018-10-19 MED ORDER — SODIUM CHLORIDE 0.9% FLUSH
3.0000 mL | Freq: Two times a day (BID) | INTRAVENOUS | Status: DC
  Administered 2018-10-19 – 2018-10-21 (×6): 3 mL via INTRAVENOUS

## 2018-10-19 MED ORDER — ACETAMINOPHEN 325 MG PO TABS: 650.0000 mg | ORAL_TABLET | Freq: Four times a day (QID) | ORAL | Status: DC | PRN

## 2018-10-19 MED ORDER — HYDRALAZINE HCL 25 MG PO TABS
25.0000 mg | ORAL_TABLET | Freq: Two times a day (BID) | ORAL | Status: DC
  Administered 2018-10-19 – 2018-10-22 (×7): 25 mg via ORAL
  Filled 2018-10-19 (×7): qty 1

## 2018-10-19 MED ORDER — ACETAMINOPHEN 650 MG RE SUPP: 650.0000 mg | Freq: Four times a day (QID) | RECTAL | Status: DC | PRN

## 2018-10-19 MED ORDER — ALBUTEROL SULFATE (2.5 MG/3ML) 0.083% IN NEBU
2.5000 mg | INHALATION_SOLUTION | RESPIRATORY_TRACT | Status: DC | PRN
  Administered 2018-10-21: 2.5 mg via RESPIRATORY_TRACT
  Filled 2018-10-19: qty 3

## 2018-10-19 MED ORDER — MAGNESIUM SULFATE 2 GM/50ML IV SOLN
2.0000 g | Freq: Once | INTRAVENOUS | Status: AC
  Administered 2018-10-19: 2 g via INTRAVENOUS
  Filled 2018-10-19: qty 50

## 2018-10-19 MED ORDER — POTASSIUM CHLORIDE 10 MEQ/100ML IV SOLN
10.0000 meq | INTRAVENOUS | Status: AC
  Administered 2018-10-19 (×2): 10 meq via INTRAVENOUS
  Filled 2018-10-19 (×2): qty 100

## 2018-10-19 MED ORDER — BISACODYL 5 MG PO TBEC: 5.0000 mg | DELAYED_RELEASE_TABLET | Freq: Every day | ORAL | Status: DC | PRN

## 2018-10-19 MED ORDER — HYDROCODONE-ACETAMINOPHEN 5-325 MG PO TABS
1.0000 | ORAL_TABLET | Freq: Once | ORAL | Status: AC
  Administered 2018-10-19: 1 via ORAL
  Filled 2018-10-19: qty 1

## 2018-10-19 MED ORDER — SODIUM CHLORIDE 0.9 % IV SOLN: 250.0000 mL | INTRAVENOUS | Status: DC | PRN

## 2018-10-19 MED ORDER — ENOXAPARIN SODIUM 40 MG/0.4ML ~~LOC~~ SOLN
40.0000 mg | SUBCUTANEOUS | Status: DC
  Administered 2018-10-19 – 2018-10-21 (×3): 40 mg via SUBCUTANEOUS
  Filled 2018-10-19 (×3): qty 0.4

## 2018-10-19 MED ORDER — PANTOPRAZOLE SODIUM 40 MG PO TBEC
40.0000 mg | DELAYED_RELEASE_TABLET | Freq: Two times a day (BID) | ORAL | Status: DC
  Administered 2018-10-19 – 2018-10-22 (×7): 40 mg via ORAL
  Filled 2018-10-19 (×7): qty 1

## 2018-10-19 MED ORDER — SODIUM CHLORIDE 0.9% FLUSH: 3.0000 mL | INTRAVENOUS | Status: DC | PRN

## 2018-10-19 MED ORDER — SENNOSIDES-DOCUSATE SODIUM 8.6-50 MG PO TABS: 1.0000 | ORAL_TABLET | Freq: Every evening | ORAL | Status: DC | PRN

## 2018-10-19 NOTE — Plan of Care (Signed)

## 2018-10-19 NOTE — ED Notes (Signed)
Attempted to call report to [facility] ([phone]); this RN contact information provided for call back.

## 2018-10-19 NOTE — ED Notes (Signed)
Pt given a basin of water, wash clothes, towel, body wash, and lotion per her request to perform morning hygiene

## 2018-10-19 NOTE — H&P (Addendum)
History and Physical  Donna Shaffer GMW:[id number] DOB: [birth year] DOA: 10/18/2018  PCP: Myrlene Broker, MD Patient coming from: Home   I have personally briefly reviewed patient's old medical records in [place]   Chief Complaint: Abnormal lab work.   HPI: Donna Shaffer is a 73 y.o. female with PMH significant for diastolic heart failure, hypertension, chronic kidney disease a stage 3 prior creatinine 1.3-1.5 who was referred for admission for correction of hypokalemia by her cardiologist.  Patient was recently admitted to the hospital 3 weeks ago for heart failure exacerbation.  On follow-up with her cardiology her potassium level was found to be low.  Beside oral supplementation as an outpatient patient potassium continue to be as low as 2.4.  Patient seen, she relates that she is feeling better.  Earlier she was having some shortness of breath when she was lying down flat.  She is feeling better now that she is back on oxygen and sitting up.  She has been taking her torsemide, they were trying to supplement her potassium.  She has lost 30 pounds over the last 2 weeks and a half.  She still feel sometimes shortness of breath on exertion.  Reports some epigastric pain, chronic.  She denies , diarrhea, nausea vomiting, cough.  Evaluation in the ED: Sodium 137, potassium 2.6, chloride 77, CO2 45, glucose 144, potassium 2.4, BUN 43 creatinine 1.8, magnesium 1.7.  Hemoglobin 15 white blood cell 8.9.  SARS coronavirus 2-.   Review of Systems: All systems reviewed and apart from history of presenting illness, are negative.  Past Medical History:  Diagnosis Date  . Anxiety   . Breast cancer ([employer])    left  . Cancer ([place])   . Colon polyps   . Depression   . Hypertension   . Personal history of radiation therapy   . Pneumonia   . Radiation 04/09/15-05/23/15   left breast 50.4 gray, lumpectomy cavity boost 10 gray  . Type 2 diabetes mellitus with hyperglycemia,  without long-term current use of insulin ([place]) 11/24/2016   Past Surgical History:  Procedure Laterality Date  . ABDOMINAL HYSTERECTOMY    . APPENDECTOMY    . BILATERAL OOPHORECTOMY    . BREAST LUMPECTOMY Left 02/2015  . CATARACT EXTRACTION Bilateral   . CHOLECYSTECTOMY    . RADIOACTIVE SEED GUIDED PARTIAL MASTECTOMY WITH AXILLARY SENTINEL LYMPH NODE BIOPSY Left 02/28/2015   Procedure: RADIOACTIVE SEED GUIDED PARTIAL MASTECTOMY WITH AXILLARY SENTINEL LYMPH NODE BIOPSY;  Surgeon: Autumn Messing III, MD;  Location: [facility];  Service: General;  Laterality: Left;   Social History:  reports that she has never smoked. She has never used smokeless tobacco. She reports that she does not drink alcohol or use drugs.   Allergies  Allergen Reactions  . Ace Inhibitors Cough  . Sulfa Antibiotics Swelling    Family History  Problem Relation Age of Onset  . Melanoma Mother   . Skin cancer Father     Prior to Admission medications   Medication Sig Start Date End Date Taking? Authorizing Provider  Adalimumab 40 MG/0.4ML PSKT Inject 40 mg into the skin every 14 (fourteen) days.    [provider]  albuterol (VENTOLIN HFA) 108 (90 Base) MCG/ACT inhaler Inhale 2 puffs into the lungs every 4 (four) hours as needed. 10/03/18   Richardo Priest, MD  budesonide-formoterol (SYMBICORT) 160-4.5 MCG/ACT inhaler Inhale 2 puffs into the lungs 2 (two) times a day. 09/27/18   [provider]  calcium carbonate (CALCIUM 600) 600 MG TABS tablet Take 1 tablet by mouth daily.    [provider]  carvedilol (COREG) 6.25 MG tablet Take 1 tablet (6.25 mg total) by mouth daily. 10/17/18   Richardo Priest, MD  cholecalciferol (VITAMIN D) 1000 units tablet Take 1 tablet (1,000 Units total) by mouth daily. 08/26/15   Magrinat, Virgie Dad, MD  citalopram (CELEXA) 40 MG tablet Take 40 mg by mouth daily.    [provider]  fluticasone (FLONASE) 50 MCG/ACT nasal spray Place 1 spray into the  nose daily. 12/28/17   [provider]  hydrALAZINE (APRESOLINE) 25 MG tablet Take 1 tablet (25 mg total) by mouth 2 (two) times a day. 08/24/18 11/22/18  Richardo Priest, MD  hydrOXYzine (ATARAX/VISTARIL) 25 MG tablet Take 25 mg by mouth daily as needed.  01/20/18   [provider]  nitroGLYCERIN (NITROSTAT) 0.4 MG SL tablet Place 1 tablet (0.4 mg total) under the tongue every 5 (five) minutes as needed for chest pain. 10/10/18 01/08/19  Richardo Priest, MD  OXYGEN Inhale 3 L into the lungs continuous.    [provider]  pantoprazole (PROTONIX) 40 MG tablet Take 40 mg by mouth daily. Reported on 07/03/2015    [provider]  potassium chloride SA (KLOR-CON M20) 20 MEQ tablet Take 1 tablet (20 mEq total) by mouth 2 (two) times daily. 10/11/18   Richardo Priest, MD  torsemide (DEMADEX) 20 MG tablet Take 1 tablet (20 mg total) by mouth 2 (two) times daily. 09/12/18 12/11/18  Richardo Priest, MD  traMADol (ULTRAM) 50 MG tablet TK 1 T PO Q 6 H PRN P 07/31/18   [provider]  triamcinolone cream (KENALOG) 0.5 % Apply topically 2 (two) times daily. 10/19/16   [provider]   Physical Exam: Vitals:   10/19/18 0203 10/19/18 0610 10/19/18 0935 10/19/18 1143  BP: (!) 166/89 (!) 141/61 (!) 154/70 (!) 154/91  Pulse: 73 67 65 73  Resp: 20 18 18 20   Temp: 97.9 F (36.6 C) 98.1 F (36.7 C)  (!) 97.5 F (36.4 C)  TempSrc: Oral Oral  Oral  SpO2: 98% 97% 99% 97%  Weight:    126.5 kg  Height:    5' 2"  (1.575 m)     General exam: Moderately built and nourished patient, lying comfortably supine on the gurney in no obvious distress.  Morbidly obese  Head, eyes and ENT: Nontraumatic and normocephalic. Pupils equally reacting to light and accommodation. Oral mucosa moist.  Neck: Supple. No JVD, carotid bruit or thyromegaly.  Lymphatics: No lymphadenopathy.  Respiratory system: Clear to auscultation. No increased work of breathing.  Cardiovascular system:  S1 and S2 heard, RRR. No JVD, murmurs, gallops, clicks or pedal edema.  Gastrointestinal system: Abdomen is nondistended, soft and nontender. Normal bowel sounds heard. No organomegaly or masses appreciated.  Central nervous system: Alert and oriented. No focal neurological deficits.  Extremities: Symmetric 5 x 5 power. Peripheral pulses symmetrically felt.   Skin: No rashes or acute findings.  Musculoskeletal system: Negative exam.  Psychiatry: Pleasant and cooperative.   Labs on Admission:  Basic Metabolic Panel: Recent Labs  Lab 10/13/18 1529 10/17/18 1053 10/18/18 1512 10/19/18 0931  NA 139 140 136 137  K 2.8* 2.7* 2.4* 2.6*  CL 74* 75* 74* 77*  CO2 49* 43* 47* 45*  GLUCOSE 189* 216* 215* 144*  BUN 38* 45* 43* 43*  CREATININE 1.54* 1.60* 1.83* 1.75*  CALCIUM 9.9 10.1 9.5 9.2  MG  --   --  1.7  --   PHOS  --   --  2.5  --    Liver Function Tests: No results for input(s): AST, ALT, ALKPHOS, BILITOT, PROT, ALBUMIN in the last 168 hours. No results for input(s): LIPASE, AMYLASE in the last 168 hours. No results for input(s): AMMONIA in the last 168 hours. CBC: Recent Labs  Lab 10/18/18 1512 10/19/18 0931  WBC 8.9 8.5  HGB 15.5* 14.0  HCT 46.8* 41.5  MCV 95.5 95.4  PLT 220 193   Cardiac Enzymes: No results for input(s): CKTOTAL, CKMB, CKMBINDEX, TROPONINI in the last 168 hours.  BNP (last 3 results) Recent Labs    07/31/18 1619 10/03/18 1557 10/10/18 1536  PROBNP 163 319* 134   CBG: No results for input(s): GLUCAP in the last 168 hours.  Radiological Exams on Admission: No results found.  EKG: We will order EKG  Assessment/Plan Active Problems:   Type 2 diabetes mellitus with other specified complication (HCC)   Chronic diastolic CHF (congestive heart failure) (HCC)   Hypokalemia   HTN (hypertension)   1-Hypokalemia; In the setting of diuretic use. Hold torsemide for now Replete IV and oral. Replete magnesium Check be met this afternoon.   2-Chronic diastolic heart failure; She has lost more than 30 pound in 2-week and a half. BUN and creatinine worse than baseline.  Creatinine on admission 1.8, BUN 42. We will hold torsemide due to AKI and hypokalemia. Resume torsemide tomorrow depending on renal function and potassium level. Check a chest x-ray, she was complaining of orthopnea.  Continue with carvedilol.  Dose recently reduced by her cardiology to once a day.   3-AKI on CKD stage;  Prior cr per record 1.3---1.5 Patient presented with a creatinine of 1.8. Likely related to overdiuresis. Hold torsemide, repeat labs in the morning. Hold on IV fluids to avoid pulmonary edema.   4-Diabetes; will order SSI.  Awaiting med rec to be done.  5-Acute on chronic hypoxic respiratory failure: She relate that she has been using 3 L of oxygen since her last hospitalization. She is on Symbicort unclear if she has a history of COPD  6-Hypertension: Continue with carvedilol and hydralazine.   DVT Prophylaxis: Lovenox Code Status: Full code Family Communication: Care discussed with patient and daughter over phone.  Disposition Plan: Admitted for further management of severe hypokalemia, will need to monitor as well on resumption of diuretic  Time spent: 75 minutes.  Elmarie Shiley MD [facility]   10/19/2018, 12:04 PM

## 2018-10-20 ENCOUNTER — Inpatient Hospital Stay (HOSPITAL_COMMUNITY): Payer: Medicare Other

## 2018-10-20 LAB — BASIC METABOLIC PANEL WITH GFR
Anion gap: 14 (ref 5–15)
Anion gap: 15 (ref 5–15)
BUN: 41 mg/dL — ABNORMAL HIGH (ref 8–23)
BUN: 39 mg/dL — ABNORMAL HIGH (ref 8–23)
CO2: 41 mmol/L — ABNORMAL HIGH (ref 22–32)
CO2: 39 mmol/L — ABNORMAL HIGH (ref 22–32)
Calcium: 9.1 mg/dL (ref 8.9–10.3)
Calcium: 9 mg/dL (ref 8.9–10.3)
Chloride: 81 mmol/L — ABNORMAL LOW (ref 98–111)
Chloride: 81 mmol/L — ABNORMAL LOW (ref 98–111)
Creatinine, Ser: 1.68 mg/dL — ABNORMAL HIGH (ref 0.44–1.00)
Creatinine, Ser: 1.76 mg/dL — ABNORMAL HIGH (ref 0.44–1.00)
GFR calc Af Amer: 35 mL/min — ABNORMAL LOW
GFR calc Af Amer: 33 mL/min — ABNORMAL LOW
GFR calc non Af Amer: 30 mL/min — ABNORMAL LOW
GFR calc non Af Amer: 28 mL/min — ABNORMAL LOW
Glucose, Bld: 137 mg/dL — ABNORMAL HIGH (ref 70–99)
Glucose, Bld: 194 mg/dL — ABNORMAL HIGH (ref 70–99)
Potassium: 2.8 mmol/L — ABNORMAL LOW (ref 3.5–5.1)
Potassium: 3.5 mmol/L (ref 3.5–5.1)
Sodium: 136 mmol/L (ref 135–145)
Sodium: 135 mmol/L (ref 135–145)

## 2018-10-20 LAB — HEPATIC FUNCTION PANEL
ALT: 149 U/L — ABNORMAL HIGH (ref 0–44)
AST: 175 U/L — ABNORMAL HIGH (ref 15–41)
Albumin: 3.6 g/dL (ref 3.5–5.0)
Alkaline Phosphatase: 78 U/L (ref 38–126)
Bilirubin, Direct: 0.2 mg/dL (ref 0.0–0.2)
Indirect Bilirubin: 0.4 mg/dL (ref 0.3–0.9)
Total Bilirubin: 0.6 mg/dL (ref 0.3–1.2)
Total Protein: 6.8 g/dL (ref 6.5–8.1)

## 2018-10-20 LAB — CBC
HCT: 40 % (ref 36.0–46.0)
Hemoglobin: 13.6 g/dL (ref 12.0–15.0)
MCH: 32.6 pg (ref 26.0–34.0)
MCHC: 34 g/dL (ref 30.0–36.0)
MCV: 95.9 fL (ref 80.0–100.0)
Platelets: 165 K/uL (ref 150–400)
RBC: 4.17 MIL/uL (ref 3.87–5.11)
RDW: 12.6 % (ref 11.5–15.5)
WBC: 7.5 K/uL (ref 4.0–10.5)
nRBC: 0 % (ref 0.0–0.2)

## 2018-10-20 LAB — BLOOD GAS, VENOUS
Acid-Base Excess: 17.1 mmol/L — ABNORMAL HIGH (ref 0.0–2.0)
Bicarbonate: 45.6 mmol/L — ABNORMAL HIGH (ref 20.0–28.0)
O2 Saturation: 93.1 %
Patient temperature: 98.6
pCO2, Ven: 73.3 mmHg (ref 44.0–60.0)
pH, Ven: 7.411 (ref 7.250–7.430)
pO2, Ven: 73.4 mmHg — ABNORMAL HIGH (ref 32.0–45.0)

## 2018-10-20 LAB — MAGNESIUM: Magnesium: 2.4 mg/dL (ref 1.7–2.4)

## 2018-10-20 LAB — GLUCOSE, CAPILLARY: Glucose-Capillary: 202 mg/dL — ABNORMAL HIGH (ref 70–99)

## 2018-10-20 LAB — VITAMIN B12: Vitamin B-12: 512 pg/mL (ref 180–914)

## 2018-10-20 LAB — AMMONIA: Ammonia: 52 umol/L — ABNORMAL HIGH (ref 9–35)

## 2018-10-20 LAB — TSH: TSH: 2.056 u[IU]/mL (ref 0.350–4.500)

## 2018-10-20 MED ORDER — POTASSIUM CHLORIDE CRYS ER 20 MEQ PO TBCR
40.0000 meq | EXTENDED_RELEASE_TABLET | ORAL | Status: AC
  Administered 2018-10-20 (×3): 40 meq via ORAL
  Filled 2018-10-20 (×3): qty 2

## 2018-10-20 MED ORDER — ALUM & MAG HYDROXIDE-SIMETH 200-200-20 MG/5ML PO SUSP
30.0000 mL | ORAL | Status: DC | PRN
  Administered 2018-10-20: 30 mL via ORAL
  Filled 2018-10-20: qty 30

## 2018-10-20 MED ORDER — LACTULOSE 10 GM/15ML PO SOLN
20.0000 g | Freq: Three times a day (TID) | ORAL | Status: DC
  Administered 2018-10-20 – 2018-10-21 (×3): 20 g via ORAL
  Filled 2018-10-20 (×3): qty 30

## 2018-10-20 NOTE — Progress Notes (Signed)
Pt restless with intermittent confusion. Forgets what she is going to say in conversation. Mouth "feels like cotton balls". Labored breathing. BP 148/77, HR 73, Oxygen saturations 98% on 3 L/min. Notified Dr Florene Glen and he assessed pt. New orders received. Will continue to monitor.

## 2018-10-20 NOTE — Progress Notes (Signed)
PROGRESS NOTE    Donna Shaffer  NLZ:[id number] DOB: 08/01/1945 DOA: 10/18/2018 PCP: Myrlene Broker, MD   Brief Narrative:  Donna Shaffer is a 73 y.o. female with PMH significant for diastolic heart failure, hypertension, chronic kidney disease a stage 3 prior creatinine 1.3-1.5 who was referred for admission for correction of hypokalemia by her cardiologist.  Patient was recently admitted to the hospital 3 weeks ago for heart failure exacerbation.  On follow-up with her cardiology her potassium level was found to be low.  Beside oral supplementation as an outpatient patient potassium continue to be as low as 2.4.  Patient seen, she relates that she is feeling better.  Earlier she was having some shortness of breath when she was lying down flat.  She is feeling better now that she is back on oxygen and sitting up.  She has been taking her torsemide, they were trying to supplement her potassium.  She has lost 30 pounds over the last 2 weeks and a half.  She still feel sometimes shortness of breath on exertion.  Reports some epigastric pain, chronic.  She denies , diarrhea, nausea vomiting, cough.  Assessment & Plan:   Active Problems:   Type 2 diabetes mellitus with other specified complication (HCC)   Chronic diastolic CHF (congestive heart failure) (HCC)   Hypokalemia   HTN (hypertension)  Acute Metabolic Encephalopathy: mild, she's A&Ox3 at time of my evaluation without focal deficits, but states she woke up SOB and a bit confused a few hrs ago while here.  ? Sleep apnea.  She's hypercarbic, but compensated.  Also has elevated ammonia level and LFT's.   Normal TSH, B12 Ammonia slightly elevated -> start lactulose VBG notable for pCO2 of 73.3, but appears compensated with metabolic alkalosis  Respiratory Alkalosis and Metabolic Alkalosis  ? Obesity hypoventilation or OSA:  She has chronic respiratory alkalosis and metabolic alkalosis -> reviewed charts at [facility]  which were notable for similar labs.   She may have sleep apnea or obesity hypoventilation.  She has not chart hx of COPD. Would benefit from outpatient sleep study  1-Hypokalemia; In the setting of diuretic use. Hold torsemide for now Continue to replace Follow  2-Chronic diastolic heart failure; She has lost more than 30 pound in 2-week and a half. BUN and creatinine worse than baseline.  Creatinine on admission 1.8, BUN 42. Creatinine on discharge from  was 1.2 (09/21/2018) (though it appears lasix was held for rising creatinine during this admission) We will hold torsemide due to AKI and hypokalemia. CXR with without pneumonia or pulm edema Continue with carvedilol.  Dose recently reduced by her cardiologist to once a day.   3-AKI on CKD stage;  Baseline creatinine at most recent hospital d/c on 7/2 was 1.2 Patient presented with a creatinine of 1.8. Likely related to overdiuresis. Hold torsemide, repeat labs in the morning. Hold on IV fluids to avoid pulmonary edema.  # Elevated LFT's  Elevated Ammonia: Follow acute hepatitis panel, HIV, RUQ [place] Follow INR in morning.  Normal platelets.  Albumin 3.6. Continue to monitor, could be related to overdiuresis?  4-Diabetes; will order SSI.  Awaiting med rec to be done.  5-Acute on chronic hypoxic respiratory failure: She relate that she has been using 3 L of oxygen since her last hospitalization. She is on Symbicort unclear if she has a history of COPD  6-Hypertension: Continue with carvedilol and hydralazine.   DVT prophylaxis: lovenox Code Status: full  Family Communication: none at bedside  Disposition Plan: pending  Consultants:   none  Procedures:   none  Antimicrobials:  Anti-infectives (From admission, onward)   None           Subjective: A&Ox3 She notes she woke up from nap short of breath and confused  Objective: Vitals:   10/20/18 0513 10/20/18 0553 10/20/18 0830 10/20/18 1203   BP: (!) 149/66   (!) 148/77  Pulse: 65   73  Resp:      Temp:      TempSrc:      SpO2: 97%  98% 98%  Weight:  123.7 kg    Height:        Intake/Output Summary (Last 24 hours) at 10/20/2018 1453 Last data filed at 10/19/2018 1611 Gross per 24 hour  Intake 240 ml  Output -  Net 240 ml   Filed Weights   10/18/18 1501 10/19/18 1143 10/20/18 0553  Weight: 123.4 kg 126.5 kg 123.7 kg    Examination:  General exam: Appears calm and comfortable  Respiratory system: Clear to auscultation. Respiratory effort normal. Cardiovascular system: S1 & S2 heard, RRR Gastrointestinal system: Abdomen is nondistended, soft and nontender Central nervous system: Alert and oriented. CN 2-12 intact.  5/5 strength throughout. Extremities: no LEE Skin: No rashes, lesions or ulcers Psychiatry: Judgement and insight appear normal. Mood & affect appropriate.     Data Reviewed: I have personally reviewed following labs and imaging studies  CBC: Recent Labs  Lab 10/18/18 1512 10/19/18 0931 10/20/18 0508  WBC 8.9 8.5 7.5  HGB 15.5* 14.0 13.6  HCT 46.8* 41.5 40.0  MCV 95.5 95.4 95.9  PLT 220 193 671   Basic Metabolic Panel: Recent Labs  Lab 10/17/18 1053 10/18/18 1512 10/19/18 0931 10/19/18 1616 10/20/18 0508  NA 140 136 137 137 136  K 2.7* 2.4* 2.6* 3.7 2.8*  CL 75* 74* 77* 80* 81*  CO2 43* 47* 45* 42* 41*  GLUCOSE 216* 215* 144* 159* 137*  BUN 45* 43* 43* 46* 41*  CREATININE 1.60* 1.83* 1.75* 1.99* 1.68*  CALCIUM 10.1 9.5 9.2 9.2 9.1  MG  --  1.7  --   --  2.4  PHOS  --  2.5  --   --   --    GFR: Estimated Creatinine Clearance: 38 mL/min (A) (by C-G formula based on SCr of 1.68 mg/dL (H)). Liver Function Tests: Recent Labs  Lab 10/20/18 1351  AST 175*  ALT 149*  ALKPHOS 78  BILITOT 0.6  PROT 6.8  ALBUMIN 3.6   No results for input(s): LIPASE, AMYLASE in the last 168 hours. Recent Labs  Lab 10/20/18 1351  AMMONIA 52*   Coagulation Profile: No results for input(s):  INR, PROTIME in the last 168 hours. Cardiac Enzymes: No results for input(s): CKTOTAL, CKMB, CKMBINDEX, TROPONINI in the last 168 hours. BNP (last 3 results) Recent Labs    07/31/18 1619 10/03/18 1557 10/10/18 1536  PROBNP 163 319* 134   HbA1C: No results for input(s): HGBA1C in the last 72 hours. CBG: Recent Labs  Lab 10/20/18 1336  GLUCAP 202*   Lipid Profile: No results for input(s): CHOL, HDL, LDLCALC, TRIG, CHOLHDL, LDLDIRECT in the last 72 hours. Thyroid Function Tests: Recent Labs    10/20/18 1351  TSH 2.056   Anemia Panel: No results for input(s): VITAMINB12, FOLATE, FERRITIN, TIBC, IRON, RETICCTPCT in the last 72 hours. Sepsis Labs: No results for input(s): PROCALCITON, LATICACIDVEN in the last 168 hours.  Recent Results (from the past 240 hour(s))  SARS Coronavirus 2 (Performed in [place] hospital lab)     Status: None   Collection Time: 10/18/18  5:15 PM   Specimen: Nasopharyngeal Swab  Result Value Ref Range Status   SARS Coronavirus 2 NEGATIVE NEGATIVE Final    Comment: (NOTE) If result is NEGATIVE SARS-CoV-2 target nucleic acids are NOT DETECTED. The SARS-CoV-2 RNA is generally detectable in upper and lower  respiratory specimens during the acute phase of infection. The lowest  concentration of SARS-CoV-2 viral copies this assay can detect is 250  copies / mL. A negative result does not preclude SARS-CoV-2 infection  and should not be used as the sole basis for treatment or other  patient management decisions.  A negative result may occur with  improper specimen collection / handling, submission of specimen other  than nasopharyngeal swab, presence of viral mutation(s) within the  areas targeted by this assay, and inadequate number of viral copies  (<250 copies / mL). A negative result must be combined with clinical  observations, patient history, and epidemiological information. If result is POSITIVE SARS-CoV-2 target nucleic acids are DETECTED.  The SARS-CoV-2 RNA is generally detectable in upper and lower  respiratory specimens dur ing the acute phase of infection.  Positive  results are indicative of active infection with SARS-CoV-2.  Clinical  correlation with patient history and other diagnostic information is  necessary to determine patient infection status.  Positive results do  not rule out bacterial infection or co-infection with other viruses. If result is PRESUMPTIVE POSTIVE SARS-CoV-2 nucleic acids MAY BE PRESENT.   A presumptive positive result was obtained on the submitted specimen  and confirmed on repeat testing.  While 2019 novel coronavirus  (SARS-CoV-2) nucleic acids may be present in the submitted sample  additional confirmatory testing may be necessary for epidemiological  and / or clinical management purposes  to differentiate between  SARS-CoV-2 and other Sarbecovirus currently known to infect humans.  If clinically indicated additional testing with an alternate test  methodology [phone]) is advised. The SARS-CoV-2 RNA is generally  detectable in upper and lower respiratory sp ecimens during the acute  phase of infection. The expected result is Negative. Fact Sheet for Patients:  StrictlyIdeas.no Fact Sheet for Healthcare Providers: BankingDealers.co.za This test is not yet approved or cleared by the [place] FDA and has been authorized for detection and/or diagnosis of SARS-CoV-2 by FDA under an Emergency Use Authorization (EUA).  This EUA will remain in effect (meaning this test can be used) for the duration of the COVID-19 declaration under Section 564(b)(1) of the Act, 21 U.S.C. section 360bbb-3(b)(1), unless the authorization is terminated or revoked sooner. Performed at [facility], [address] [place], [place] [address]          Radiology Studies: Dg Chest 2 View  Result Date: 10/19/2018 CLINICAL DATA:  Dyspnea and SOB  - chart notes:PMH significant for diastolic heart failure, hypertension, chronic kidney disease a stage 3 prior creatinine 1.3-1.5 who was referred for admission for correction of hypokalemia by her cardiologist. EXAM: CHEST - 2 VIEW COMPARISON:  09/20/2018 FINDINGS: The heart is enlarged. There is minimal atelectasis at the LEFT lung base. Aeration has improved since previous exam. No focal consolidations or pleural effusions. No pulmonary edema. Degenerative changes are seen in thoracic spine. Surgical clips are identified in the LEFT axillary region. IMPRESSION: 1. Cardiomegaly. 2. Minimal LEFT lower lobe atelectasis.  Improved aeration. Electronically Signed   By: Nolon Nations M.D.   On:  10/19/2018 16:11   Dg Chest Port 1 View  Result Date: 10/20/2018 CLINICAL DATA:  Cough and shortness of breath. EXAM: PORTABLE CHEST 1 VIEW COMPARISON:  10/19/2018 and earlier exams. FINDINGS: Stable mild cardiomegaly. Mild linear opacities noted along the left heart border and at the lung bases consistent with atelectasis. Remainder of the lungs is clear. No convincing pleural effusion.  No pneumothorax. IMPRESSION: 1. No change compared to the previous day's study. 2. Cardiomegaly and basilar atelectasis. No convincing pneumonia or pulmonary edema. Electronically Signed   By: Lajean Manes M.D.   On: 10/20/2018 14:19        Scheduled Meds: . carvedilol  6.25 mg Oral Daily  . citalopram  20 mg Oral Daily  . enoxaparin (LOVENOX) injection  40 mg Subcutaneous Q24H  . hydrALAZINE  25 mg Oral BID  . lactulose  20 g Oral TID  . mometasone-formoterol  2 puff Inhalation BID  . pantoprazole  40 mg Oral BID  . potassium chloride  40 mEq Oral Q4H  . sodium chloride flush  3 mL Intravenous Q12H   Continuous Infusions: . sodium chloride 1,000 mL (10/18/18 2201)  . sodium chloride       LOS: 2 days    Time spent: over 30 min    Fayrene Helper, MD [facility] Pager AMION  If 7PM-7AM, please  contact night-coverage [email or web address] Password [facility] 10/20/2018, 2:53 PM

## 2018-10-21 DIAGNOSIS — R748 Abnormal levels of other serum enzymes: Secondary | ICD-10-CM

## 2018-10-21 LAB — COMPREHENSIVE METABOLIC PANEL
ALT: 157 U/L — ABNORMAL HIGH (ref 0–44)
AST: 185 U/L — ABNORMAL HIGH (ref 15–41)
Albumin: 3.4 g/dL — ABNORMAL LOW (ref 3.5–5.0)
Alkaline Phosphatase: 88 U/L (ref 38–126)
Anion gap: 11 (ref 5–15)
BUN: 38 mg/dL — ABNORMAL HIGH (ref 8–23)
CO2: 41 mmol/L — ABNORMAL HIGH (ref 22–32)
Calcium: 9.2 mg/dL (ref 8.9–10.3)
Chloride: 86 mmol/L — ABNORMAL LOW (ref 98–111)
Creatinine, Ser: 1.63 mg/dL — ABNORMAL HIGH (ref 0.44–1.00)
GFR calc Af Amer: 36 mL/min — ABNORMAL LOW (ref >60)
GFR calc non Af Amer: 31 mL/min — ABNORMAL LOW (ref >60)
Glucose, Bld: 137 mg/dL — ABNORMAL HIGH (ref 70–99)
Potassium: 3.9 mmol/L (ref 3.5–5.1)
Sodium: 138 mmol/L (ref 135–145)
Total Bilirubin: 0.6 mg/dL (ref 0.3–1.2)
Total Protein: 6.8 g/dL (ref 6.5–8.1)

## 2018-10-21 LAB — CBC
HCT: 41.8 % (ref 36.0–46.0)
Hemoglobin: 13.5 g/dL (ref 12.0–15.0)
MCH: 32.1 pg (ref 26.0–34.0)
MCHC: 32.3 g/dL (ref 30.0–36.0)
MCV: 99.5 fL (ref 80.0–100.0)
Platelets: 200 10*3/uL (ref 150–400)
RBC: 4.2 MIL/uL (ref 3.87–5.11)
RDW: 12.8 % (ref 11.5–15.5)
WBC: 7.1 10*3/uL (ref 4.0–10.5)
nRBC: 0 % (ref 0.0–0.2)

## 2018-10-21 LAB — URINALYSIS, ROUTINE W REFLEX MICROSCOPIC
Bilirubin Urine: NEGATIVE
Glucose, UA: NEGATIVE mg/dL
Hgb urine dipstick: NEGATIVE
Ketones, ur: NEGATIVE mg/dL
Nitrite: NEGATIVE
Protein, ur: NEGATIVE mg/dL
Specific Gravity, Urine: 1.014 (ref 1.005–1.030)
pH: 5 (ref 5.0–8.0)

## 2018-10-21 LAB — HEPATITIS PANEL, ACUTE
HCV Ab: 0.1 s/co ratio (ref 0.0–0.9)
Hep A IgM: NEGATIVE
Hep B C IgM: NEGATIVE
Hepatitis B Surface Ag: NEGATIVE

## 2018-10-21 LAB — PROTIME-INR
INR: 1 (ref 0.8–1.2)
Prothrombin Time: 12.9 seconds (ref 11.4–15.2)

## 2018-10-21 LAB — HIV ANTIBODY (ROUTINE TESTING W REFLEX): HIV Screen 4th Generation wRfx: NONREACTIVE

## 2018-10-21 LAB — MAGNESIUM: Magnesium: 2.7 mg/dL — ABNORMAL HIGH (ref 1.7–2.4)

## 2018-10-21 LAB — AMMONIA: Ammonia: 38 umol/L — ABNORMAL HIGH (ref 9–35)

## 2018-10-21 LAB — BRAIN NATRIURETIC PEPTIDE: B Natriuretic Peptide: 97.6 pg/mL (ref 0.0–100.0)

## 2018-10-21 MED ORDER — LACTULOSE 10 GM/15ML PO SOLN: 20.0000 g | Freq: Two times a day (BID) | ORAL | Status: DC

## 2018-10-21 MED ORDER — SIMETHICONE 80 MG PO CHEW
80.0000 mg | CHEWABLE_TABLET | Freq: Four times a day (QID) | ORAL | Status: DC | PRN
  Administered 2018-10-21 (×2): 80 mg via ORAL
  Filled 2018-10-21 (×2): qty 1

## 2018-10-21 NOTE — Evaluation (Signed)
Physical Therapy Evaluation Patient Details Name: Donna Shaffer MRN: [id number] DOB: 09/26/1945 Today's Date: 10/21/2018   History of Present Illness  Donna Shaffer is a 73 y.o. female with PMH significant for diastolic heart failure, hypertension, chronic kidney disease a stage 3 prior creatinine 1.3-1.5 who was referred for admission for correction of hypokalemia by her cardiologist.  Patient was recently admitted to the hospital 3 weeks ago for heart failure exacerbation.  Clinical Impression  Pt admitted with above diagnosis. Pt currently with functional limitations due to the deficits listed below (see PT Problem List).  Pt will benefit from skilled PT to increase their independence and safety with mobility to allow discharge to the venue listed below. Pt o2 95% on 3 L/min with dyspnea during gait. No LOB noted.  She reports increased difficulty with ADLs, so much that daughter has had to start bathing her in the last few weeks.  Recommend continuing with her [facility] services she was receiving.      Follow Up Recommendations Home health PT    Equipment Recommendations  None recommended by PT    Recommendations for Other Services OT consult     Precautions / Restrictions Precautions Precautions: Other (comment) Precaution Comments: monitor 02 Restrictions Weight Bearing Restrictions: No      Mobility  Bed Mobility Overal bed mobility: Modified Independent                Transfers Overall transfer level: Modified independent               General transfer comment: stood from bed and toilet with MOD I  Ambulation/Gait   Gait Distance (Feet): 90 Feet Assistive device: None Gait Pattern/deviations: Step-through pattern;Decreased stride length     General Gait Details: Pt with dyspnea during gait on 3 L/min with o2 95% at end of gait. No LOB noted.  Stairs            Wheelchair Mobility    Modified Rankin (Stroke Patients Only)        Balance Overall balance assessment: History of Falls                                           Pertinent Vitals/Pain Pain Assessment: 0-10 Pain Score: 7  Pain Location: mid chest to back Pain Descriptors / Indicators: Aching;Dull Pain Intervention(s): Limited activity within patient's tolerance;Monitored during session    Home Living Family/patient expects to be discharged to:: Private residence Living Arrangements: Other relatives Available Help at Discharge: Family;Available PRN/intermittently Type of Home: House Home Access: Stairs to enter   [employer] of Steps: 1 Home Layout: One level Home Equipment: Shower seat;Hand held shower head Additional Comments: was getting HHPT    Prior Function Level of Independence: Needs assistance   Gait / Transfers Assistance Needed: Amb without AD.  ADL's / Homemaking Assistance Needed: Increased A for bathing in the last 2 weeks. More difficulty with ADLs recently.        Hand Dominance   Dominant Hand: Right    Extremity/Trunk Assessment   Upper Extremity Assessment Upper Extremity Assessment: Overall WFL for tasks assessed    Lower Extremity Assessment Lower Extremity Assessment: Overall WFL for tasks assessed       Communication   Communication: No difficulties  Cognition Arousal/Alertness: Awake/alert Behavior During Therapy: WFL for tasks assessed/performed Overall Cognitive Status: Within Functional Limits for tasks  assessed                                        General Comments      Exercises     Assessment/Plan    PT Assessment Patient needs continued PT services  PT Problem List Decreased strength;Decreased activity tolerance;Cardiopulmonary status limiting activity;Decreased mobility       PT Treatment Interventions DME instruction;Gait training;Functional mobility training;Therapeutic activities;Therapeutic exercise;Balance training    PT Goals  (Current goals can be found in the Care Plan section)  Acute Rehab PT Goals Patient Stated Goal: Pt agreeable to walk Time For Goal Achievement: 10/28/18 Potential to Achieve Goals: Good    Frequency Min 3X/week   Barriers to discharge        Co-evaluation               AM-PAC PT "6 Clicks" Mobility  Outcome Measure Help needed turning from your back to your side while in a flat bed without using bedrails?: None   Help needed moving to and from a bed to a chair (including a wheelchair)?: None Help needed standing up from a chair using your arms (e.g., wheelchair or bedside chair)?: None Help needed to walk in hospital room?: A Little Help needed climbing 3-5 steps with a railing? : A Little 6 Click Score: 18    End of Session Equipment Utilized During Treatment: Gait belt;Oxygen Activity Tolerance: Patient limited by fatigue Patient left: in chair;with call bell/phone within reach Nurse Communication: Mobility status PT Visit Diagnosis: Difficulty in walking, not elsewhere classified (R26.2)    Time: 1448-1856 PT Time Calculation (min) (ACUTE ONLY): 30 min   Charges:   PT Evaluation $PT Eval Low Complexity: 1 Low PT Treatments $Gait Training: 8-22 mins        Dorrine Montone L. Tamala Julian, [place] Pager [phone] 10/21/2018   Galen Manila 10/21/2018, 10:28 AM

## 2018-10-21 NOTE — Evaluation (Signed)
Occupational Therapy Evaluation and Discharge Patient Details Name: Donna Shaffer MRN: [id number] DOB: December 16, 1945 Today's Date: 10/21/2018    History of Present Illness Donna Shaffer is a 73 y.o. female with PMH significant for diastolic heart failure, hypertension, chronic kidney disease a stage 3 prior creatinine 1.3-1.5 who was referred for admission for correction of hypokalemia by her cardiologist.  Patient was recently admitted to the hospital 3 weeks ago for heart failure exacerbation.   Clinical Impression   Pt was struggling with ADL prior to admission due to fatigue and SOB. Pt educated in energy conservation and use of AE for LB ADL. Pt very appreciative. She is functioning modified independently. No further OT needs.    Follow Up Recommendations  No OT follow up    Equipment Recommendations  None recommended by OT    Recommendations for Other Services       Precautions / Restrictions Precautions Precaution Comments: monitor 02 Restrictions Weight Bearing Restrictions: No      Mobility Bed Mobility Overal bed mobility: Modified Independent                Transfers Overall transfer level: Modified independent Equipment used: None                  Balance Overall balance assessment: History of Falls                                         ADL either performed or assessed with clinical judgement   ADL Overall ADL's : Modified independent                                       General ADL Comments: Educated pt in benefits of use of AE for LB ADL and in energy conservation. Gave pt written energy conservation strategies.     Vision Patient Visual Report: No change from baseline       Perception     Praxis      Pertinent Vitals/Pain Pain Location: mid chest to back Pain Descriptors / Indicators: Aching;Dull Pain Intervention(s): Monitored during session     Hand Dominance Right    Extremity/Trunk Assessment Upper Extremity Assessment Upper Extremity Assessment: Overall WFL for tasks assessed   Lower Extremity Assessment Lower Extremity Assessment: Defer to PT evaluation   Cervical / Trunk Assessment Cervical / Trunk Assessment: Other exceptions(obesity)   Communication Communication Communication: No difficulties   Cognition Arousal/Alertness: Awake/alert Behavior During Therapy: WFL for tasks assessed/performed Overall Cognitive Status: Within Functional Limits for tasks assessed                                     General Comments       Exercises     Shoulder Instructions      Home Living Family/patient expects to be discharged to:: Private residence Living Arrangements: Other relatives Available Help at Discharge: Family;Available PRN/intermittently Type of Home: House Home Access: Stairs to enter [employer] of Steps: 1   Home Layout: One level     Bathroom Shower/Tub: Teacher, early years/pre: Standard     Home Equipment: Shower seat;Hand held shower head   Additional Comments: was receiving HHPT and HHRN  Prior Functioning/Environment Level of Independence: Needs assistance  Gait / Transfers Assistance Needed: Amb without AD. ADL's / Homemaking Assistance Needed: Increased A for bathing in the last 2 weeks. More difficulty with ADLs recently.            OT Problem List: Decreased activity tolerance      OT Treatment/Interventions:      OT Goals(Current goals can be found in the care plan section) Acute Rehab OT Goals Patient Stated Goal: Be as independent as possible  OT Frequency:     Barriers to D/C:            Co-evaluation              AM-PAC OT "6 Clicks" Daily Activity     Outcome Measure Help from another person eating meals?: None Help from another person taking care of personal grooming?: None Help from another person toileting, which includes using toliet,  bedpan, or urinal?: None Help from another person bathing (including washing, rinsing, drying)?: None Help from another person to put on and taking off regular upper body clothing?: None Help from another person to put on and taking off regular lower body clothing?: None 6 Click Score: 24   End of Session Equipment Utilized During Treatment: Oxygen  Activity Tolerance: Patient tolerated treatment well Patient left: in bed;with call bell/phone within reach;with nursing/sitter in room  OT Visit Diagnosis: Other (comment)(decreased activity tolerance)                Time: 1345-1414 OT Time Calculation (min): 29 min Charges:  OT General Charges $OT Visit: 1 Visit OT Evaluation $OT Eval Low Complexity: 1 Low OT Treatments $Self Care/Home Management : 8-22 mins  Nestor Lewandowsky, OTR/L Acute Rehabilitation Services Pager: [phone] Office: [phone]  Malka So 10/21/2018, 2:55 PM

## 2018-10-21 NOTE — Progress Notes (Signed)
PROGRESS NOTE    Donna Shaffer  QIW:[id number] DOB: [birth year] DOA: 10/18/2018 PCP: Myrlene Broker, MD   Brief Narrative:  Donna Shaffer is a 73 y.o. female with PMH significant for diastolic heart failure, hypertension, chronic kidney disease a stage 3 prior creatinine 1.3-1.5 who was referred for admission for correction of hypokalemia by her cardiologist.  Patient was recently admitted to the hospital 3 weeks ago for heart failure exacerbation.  On follow-up with her cardiology her potassium level was found to be low.  Beside oral supplementation as an outpatient patient potassium continue to be as low as 2.4.  Patient seen, she relates that she is feeling better.  Earlier she was having some shortness of breath when she was lying down flat.  She is feeling better now that she is back on oxygen and sitting up.  She has been taking her torsemide, they were trying to supplement her potassium.  She has lost 30 pounds over the last 2 weeks and a half.  She still feel sometimes shortness of breath on exertion.  Reports some epigastric pain, chronic.  She denies , diarrhea, nausea vomiting, cough.  Assessment & Plan:   Active Problems:   Type 2 diabetes mellitus with other specified complication (HCC)   Chronic diastolic CHF (congestive heart failure) (HCC)   Hypokalemia   HTN (hypertension)  Acute Metabolic Encephalopathy: mild, she's A&Ox3 at time of my evaluation without focal deficits, but states she woke up SOB and a bit confused a few hrs ago while here.  ? Sleep apnea.  She's hypercarbic, but compensated.  Also has elevated ammonia level and LFT's.   Normal TSH, B12 Ammonia slightly elevated -> start lactulose - no asterixis noted VBG notable for pCO2 of 73.3, but appears compensated with metabolic alkalosis  Respiratory Alkalosis and Metabolic Alkalosis  ? Obesity hypoventilation or OSA:  She has chronic respiratory alkalosis and metabolic alkalosis -> reviewed  charts at [facility] which were notable for similar labs.   She may have sleep apnea or obesity hypoventilation.  She has not chart hx of COPD. Would benefit from outpatient sleep study  1-Hypokalemia; In the setting of diuretic use. Hold torsemide for now Continue to replace Follow  2-Chronic diastolic heart failure; She has lost more than 30 pound in 2-week and a half. BUN and creatinine worse than baseline.  Creatinine on admission 1.8, BUN 42. Creatinine on discharge from [facility] was 1.2 (09/21/2018) (though it appears lasix was held for rising creatinine during this admission) We will hold torsemide due to AKI and hypokalemia Her BNP is within normal limits Will repeat CXR again on 8/2 to ensure stability CXR with without pneumonia or pulm edema Continue with carvedilol.  Dose recently reduced by her cardiologist to once a day.   3-AKI on CKD stage;  Baseline creatinine at most recent hospital d/c on 7/2 was 1.2 Patient presented with a creatinine of 1.8. Likely related to overdiuresis. Hold torsemide, repeat labs in the morning. Hold on IV fluids to avoid pulmonary edema. Creatinine improving  # Elevated LFT's  Elevated Ammonia: Follow acute hepatitis panel, HIV, RUQ [place] (unremarkable) Follow INR in morning (wnl).  Normal platelets.  Albumin 3.6. Continue to monitor, could be related to overdiuresis? Relatively stable, follow  4-Diabetes; will order SSI.  Awaiting med rec to be done.  5-Acute on chronic hypoxic respiratory failure: She relate that she has been using 3 L of oxygen since her last hospitalization. She is on Symbicort unclear if she  has a history of COPD  6-Hypertension: Continue with carvedilol and hydralazine.   DVT prophylaxis: lovenox Code Status: full  Family Communication: none at bedside Disposition Plan: pending  Consultants:   none  Procedures:   none  Antimicrobials:  Anti-infectives (From admission, onward)   None           Subjective: Doing ok today Had abdominal pain after diarrhea This is now better  Objective: Vitals:   10/20/18 2049 10/21/18 0437 10/21/18 0821 10/21/18 1405  BP: (!) 146/77 (!) 147/74  135/71  Pulse: 68 75  68  Resp: 20 20  18   Temp: 97.9 F (36.6 C) 97.7 F (36.5 C)  97.9 F (36.6 C)  TempSrc: Oral Oral  Oral  SpO2: 95% 98% 98% 96%  Weight:  127.1 kg    Height:        Intake/Output Summary (Last 24 hours) at 10/21/2018 1725 Last data filed at 10/21/2018 1305 Gross per 24 hour  Intake 780 ml  Output 75 ml  Net 705 ml   Filed Weights   10/19/18 1143 10/20/18 0553 10/21/18 0437  Weight: 126.5 kg 123.7 kg 127.1 kg    Examination:  General: No acute distress. Obese. Cardiovascular: Heart sounds show a regular rate, and rhythm Lungs: Clear to auscultation bilaterally  Abdomen: Soft, nontender, nondistended  Neurological: Alert and oriented 3. Moves all extremities 4. Cranial nerves II through XII grossly intact. Skin: Warm and dry. No rashes or lesions. Extremities: No clubbing or cyanosis.  Edema difficult to judge due to body habitus.   Data Reviewed: I have personally reviewed following labs and imaging studies  CBC: Recent Labs  Lab 10/18/18 1512 10/19/18 0931 10/20/18 0508 10/21/18 0517  WBC 8.9 8.5 7.5 7.1  HGB 15.5* 14.0 13.6 13.5  HCT 46.8* 41.5 40.0 41.8  MCV 95.5 95.4 95.9 99.5  PLT 220 193 165 440   Basic Metabolic Panel: Recent Labs  Lab 10/18/18 1512 10/19/18 0931 10/19/18 1616 10/20/18 0508 10/20/18 1351 10/21/18 0517  NA 136 137 137 136 135 138  K 2.4* 2.6* 3.7 2.8* 3.5 3.9  CL 74* 77* 80* 81* 81* 86*  CO2 47* 45* 42* 41* 39* 41*  GLUCOSE 215* 144* 159* 137* 194* 137*  BUN 43* 43* 46* 41* 39* 38*  CREATININE 1.83* 1.75* 1.99* 1.68* 1.76* 1.63*  CALCIUM 9.5 9.2 9.2 9.1 9.0 9.2  MG 1.7  --   --  2.4  --  2.7*  PHOS 2.5  --   --   --   --   --    GFR: Estimated Creatinine Clearance: 39.8 mL/min (A) (by C-G formula based on SCr  of 1.63 mg/dL (H)). Liver Function Tests: Recent Labs  Lab 10/20/18 1351 10/21/18 0517  AST 175* 185*  ALT 149* 157*  ALKPHOS 78 88  BILITOT 0.6 0.6  PROT 6.8 6.8  ALBUMIN 3.6 3.4*   No results for input(s): LIPASE, AMYLASE in the last 168 hours. Recent Labs  Lab 10/20/18 1351 10/21/18 0859  AMMONIA 52* 38*   Coagulation Profile: Recent Labs  Lab 10/21/18 0517  INR 1.0   Cardiac Enzymes: No results for input(s): CKTOTAL, CKMB, CKMBINDEX, TROPONINI in the last 168 hours. BNP (last 3 results) Recent Labs    07/31/18 1619 10/03/18 1557 10/10/18 1536  PROBNP 163 319* 134   HbA1C: No results for input(s): HGBA1C in the last 72 hours. CBG: Recent Labs  Lab 10/20/18 1336  GLUCAP 202*   Lipid Profile: No results for  input(s): CHOL, HDL, LDLCALC, TRIG, CHOLHDL, LDLDIRECT in the last 72 hours. Thyroid Function Tests: Recent Labs    10/20/18 1351  TSH 2.056   Anemia Panel: Recent Labs    10/20/18 1351  VITAMINB12 512   Sepsis Labs: No results for input(s): PROCALCITON, LATICACIDVEN in the last 168 hours.  Recent Results (from the past 240 hour(s))  SARS Coronavirus 2 (Performed in [facility] hospital lab)     Status: None   Collection Time: 10/18/18  5:15 PM   Specimen: Nasopharyngeal Swab  Result Value Ref Range Status   SARS Coronavirus 2 NEGATIVE NEGATIVE Final    Comment: (NOTE) If result is NEGATIVE SARS-CoV-2 target nucleic acids are NOT DETECTED. The SARS-CoV-2 RNA is generally detectable in upper and lower  respiratory specimens during the acute phase of infection. The lowest  concentration of SARS-CoV-2 viral copies this assay can detect is 250  copies / mL. A negative result does not preclude SARS-CoV-2 infection  and should not be used as the sole basis for treatment or other  patient management decisions.  A negative result may occur with  improper specimen collection / handling, submission of specimen other  than nasopharyngeal swab,  presence of viral mutation(s) within the  areas targeted by this assay, and inadequate number of viral copies  (<250 copies / mL). A negative result must be combined with clinical  observations, patient history, and epidemiological information. If result is POSITIVE SARS-CoV-2 target nucleic acids are DETECTED. The SARS-CoV-2 RNA is generally detectable in upper and lower  respiratory specimens dur ing the acute phase of infection.  Positive  results are indicative of active infection with SARS-CoV-2.  Clinical  correlation with patient history and other diagnostic information is  necessary to determine patient infection status.  Positive results do  not rule out bacterial infection or co-infection with other viruses. If result is PRESUMPTIVE POSTIVE SARS-CoV-2 nucleic acids MAY BE PRESENT.   A presumptive positive result was obtained on the submitted specimen  and confirmed on repeat testing.  While 2019 novel coronavirus  (SARS-CoV-2) nucleic acids may be present in the submitted sample  additional confirmatory testing may be necessary for epidemiological  and / or clinical management purposes  to differentiate between  SARS-CoV-2 and other Sarbecovirus currently known to infect humans.  If clinically indicated additional testing with an alternate test  methodology [phone]) is advised. The SARS-CoV-2 RNA is generally  detectable in upper and lower respiratory sp ecimens during the acute  phase of infection. The expected result is Negative. Fact Sheet for Patients:  StrictlyIdeas.no Fact Sheet for Healthcare Providers: BankingDealers.co.za This test is not yet approved or cleared by the [place] FDA and has been authorized for detection and/or diagnosis of SARS-CoV-2 by FDA under an Emergency Use Authorization (EUA).  This EUA will remain in effect (meaning this test can be used) for the duration of the COVID-19 declaration under  Section 564(b)(1) of the Act, 21 U.S.C. section 360bbb-3(b)(1), unless the authorization is terminated or revoked sooner. Performed at [facility], [address] [place], [place] [address]          Radiology Studies: Dg Chest [place] 1 View  Result Date: 10/20/2018 CLINICAL DATA:  Cough and shortness of breath. EXAM: PORTABLE CHEST 1 VIEW COMPARISON:  10/19/2018 and earlier exams. FINDINGS: Stable mild cardiomegaly. Mild linear opacities noted along the left heart border and at the lung bases consistent with atelectasis. Remainder of the lungs is clear. No convincing pleural  effusion.  No pneumothorax. IMPRESSION: 1. No change compared to the previous day's study. 2. Cardiomegaly and basilar atelectasis. No convincing pneumonia or pulmonary edema. Electronically Signed   By: Lajean Manes M.D.   On: 10/20/2018 14:19   US Abdomen Limited Ruq  Result Date: 10/20/2018 CLINICAL DATA:  73 year old female with abnormal LFTs. Prior cholecystectomy. EXAM: ULTRASOUND ABDOMEN LIMITED RIGHT UPPER QUADRANT COMPARISON:  None. Chest CT 12/26/2017 [facility] FINDINGS: Gallbladder: Surgically absent, with evidence of the prior cholecystectomy on the 2019 CT. Common bile duct: Diameter: 3 millimeters, normal. Liver: Heterogeneous liver echotexture. Liver echogenicity is at the upper limits of normal to increased (image 21). No discrete liver lesion is evident. No intrahepatic biliary ductal dilatation is evident. Portal vein is patent on color Doppler imaging with normal direction of blood flow towards the liver. Other: Negative visible right kidney.  No free fluid. IMPRESSION: No acute findings on right upper quadrant ultrasound. Electronically Signed   By: Genevie Ann M.D.   On: 10/20/2018 16:55        Scheduled Meds: . carvedilol  6.25 mg Oral Daily  . citalopram  20 mg Oral Daily  . enoxaparin (LOVENOX) injection  40 mg Subcutaneous Q24H  . hydrALAZINE  25 mg Oral BID  . [START ON  10/22/2018] lactulose  20 g Oral BID  . mometasone-formoterol  2 puff Inhalation BID  . pantoprazole  40 mg Oral BID  . sodium chloride flush  3 mL Intravenous Q12H   Continuous Infusions: . sodium chloride 1,000 mL (10/18/18 2201)  . sodium chloride       LOS: 3 days    Time spent: over 30 min    Fayrene Helper, MD [facility] Pager AMION  If 7PM-7AM, please contact night-coverage [email or web address] Password [email or web address] 10/21/2018, 5:25 PM

## 2018-10-22 ENCOUNTER — Inpatient Hospital Stay (HOSPITAL_COMMUNITY): Payer: Medicare Other

## 2018-10-22 LAB — HEMOGLOBIN A1C
Hgb A1c MFr Bld: 6.2 % — ABNORMAL HIGH (ref 4.8–5.6)
Mean Plasma Glucose: 131.24 mg/dL

## 2018-10-22 LAB — AMMONIA: Ammonia: 41 umol/L — ABNORMAL HIGH (ref 9–35)

## 2018-10-22 LAB — CBC
HCT: 40.4 % (ref 36.0–46.0)
Hemoglobin: 12.9 g/dL (ref 12.0–15.0)
MCH: 31.8 pg (ref 26.0–34.0)
MCHC: 31.9 g/dL (ref 30.0–36.0)
MCV: 99.5 fL (ref 80.0–100.0)
Platelets: 167 10*3/uL (ref 150–400)
RBC: 4.06 MIL/uL (ref 3.87–5.11)
RDW: 12.6 % (ref 11.5–15.5)
WBC: 7.3 10*3/uL (ref 4.0–10.5)
nRBC: 0 % (ref 0.0–0.2)

## 2018-10-22 LAB — COMPREHENSIVE METABOLIC PANEL
ALT: 153 U/L — ABNORMAL HIGH (ref 0–44)
AST: 161 U/L — ABNORMAL HIGH (ref 15–41)
Albumin: 3.4 g/dL — ABNORMAL LOW (ref 3.5–5.0)
Alkaline Phosphatase: 78 U/L (ref 38–126)
Anion gap: 11 (ref 5–15)
BUN: 35 mg/dL — ABNORMAL HIGH (ref 8–23)
CO2: 41 mmol/L — ABNORMAL HIGH (ref 22–32)
Calcium: 9.2 mg/dL (ref 8.9–10.3)
Chloride: 86 mmol/L — ABNORMAL LOW (ref 98–111)
Creatinine, Ser: 1.46 mg/dL — ABNORMAL HIGH (ref 0.44–1.00)
GFR calc Af Amer: 41 mL/min — ABNORMAL LOW (ref >60)
GFR calc non Af Amer: 36 mL/min — ABNORMAL LOW (ref >60)
Glucose, Bld: 132 mg/dL — ABNORMAL HIGH (ref 70–99)
Potassium: 3.8 mmol/L (ref 3.5–5.1)
Sodium: 138 mmol/L (ref 135–145)
Total Bilirubin: 0.6 mg/dL (ref 0.3–1.2)
Total Protein: 6.8 g/dL (ref 6.5–8.1)

## 2018-10-22 LAB — MAGNESIUM: Magnesium: 2.5 mg/dL — ABNORMAL HIGH (ref 1.7–2.4)

## 2018-10-22 MED ORDER — TORSEMIDE 20 MG PO TABS: 20.0000 mg | ORAL_TABLET | Freq: Every day | ORAL | 0 refills | Status: DC

## 2018-10-22 MED ORDER — ALBUTEROL SULFATE HFA 108 (90 BASE) MCG/ACT IN AERS: 2.0000 | INHALATION_SPRAY | Freq: Four times a day (QID) | RESPIRATORY_TRACT | 0 refills | Status: DC | PRN

## 2018-10-22 MED ORDER — POTASSIUM CHLORIDE CRYS ER 20 MEQ PO TBCR: 40.0000 meq | EXTENDED_RELEASE_TABLET | Freq: Every day | ORAL | 0 refills | Status: DC

## 2018-10-22 MED ORDER — ALBUTEROL SULFATE (2.5 MG/3ML) 0.083% IN NEBU: 2.5000 mg | INHALATION_SOLUTION | Freq: Four times a day (QID) | RESPIRATORY_TRACT | 0 refills | Status: DC | PRN

## 2018-10-22 NOTE — Progress Notes (Signed)
Nutrition Education Note RD working remotely.   RD consulted for nutrition education regarding CHF, low sodium diet.  Handouts from the Academy of Nutrition and Dietetics placed in Discharge Instructions: "Low Sodium Nutrition Therapy," "Sodium Free Flavoring Tips," and "Sodium Content of Foods."   Reviewed patient's dietary recall. Provided examples on ways to decrease sodium intake in diet. Discouraged intake of processed foods and use of salt shaker. Encouraged fresh fruits and vegetables as well as whole grain sources of carbohydrates to maximize fiber intake.   RD discussed why it is important for patient to adhere to diet recommendations, and emphasized the role of fluids, foods to avoid, and importance of weighing self daily. Teach back method used.  Expect fair compliance.  Body mass index is 51.58 kg/m. Pt meets criteria for morbid obesity based on current BMI. Current weight is 282 lb and weight on 6/4 was 287 lb. This indicates 5 lb weight loss (1.7% body weight) in the past 2 months.   Current diet order is Heart Healthy, patient is consuming approximately 100% of meals at this time. Labs and medications reviewed.   No further nutrition interventions warranted at this time. RD contact information provided. If additional nutrition issues arise, please re-consult RD.     Jarome Matin, MS, RD, LDN, [facility] Inpatient Clinical Dietitian Pager # [phone] After hours/weekend pager # [phone]

## 2018-10-22 NOTE — Progress Notes (Addendum)
No change from am assessment. Pt remains A&Ox4, ambulatory. Discharge instructions reviewed with patient and daughter [name]. Questions, concerns denied. Daughter will bring oxygen tank for pickup. Inhaler placed in discharge packet with instructions. Nebulizer & OT equipment in pt belonging bag.

## 2018-10-22 NOTE — Discharge Summary (Signed)
Physician Discharge Summary  Kylinn Shropshire EHM:[id number] DOB: October 13, 1945 DOA: 10/18/2018  PCP: Myrlene Broker, MD  Admit date: 10/18/2018 Discharge date: 10/22/2018  Time spent: 40 minutes  Recommendations for Outpatient Follow-up:  1. Follow outpatient CBC/CMP 2. Discharged on lower dose of torsemide with potassium supplementation, follow volume status and lytes and kidney function outpatient  3.  Needs sleep study -> ? OHS/OSA.   4. Also would benefit from PFTs 5. Follow pending A1c at discharge, elevated blood sugars noted here 6. Elevated ammonia in house, no evidence of cirrhosis - follow outpatient, no lactulose on d/c 7. Elevated LFT's -> follow outpatient, work up additionally as needed  Discharge Diagnoses:  Active Problems:   Type 2 diabetes mellitus with other specified complication (HCC)   Chronic diastolic CHF (congestive heart failure) (HCC)   Hypokalemia   HTN (hypertension)   Elevated liver enzymes   Discharge Condition: stable  Diet recommendation: heart healthy  Filed Weights   10/20/18 0553 10/21/18 0437 10/22/18 0625  Weight: 123.7 kg 127.1 kg 127.9 kg    History of present illness:  Jniya Madara Morrisis Shamar Kracke 73 y.o.femalewith PMHsignificant for diastolic heart failure, hypertension, chronic kidney disease Maybree Riling stage 3 prior creatinine 1.3-1.5 who was referred for admission for correction of hypokalemia by her cardiologist. Patient was recently admitted to the hospital 3 weeks ago for heart failure exacerbation. On follow-up with her cardiology her potassium level was found to be low. Beside oral supplementation as an outpatient patient potassium continue to be as low as 2.4.  Patient seen, she relates that she is feeling better. Earlier she was having some shortness of breath when she was lying down flat. She is feeling better now that she is back on oxygen and sitting up. She has been taking her torsemide,they were trying to supplement her  potassium.  She has lost 30 pounds over the last 2 weeks and Kazuko Clemence half. She still feel sometimes shortness of breath on exertion. Reports some epigastric pain, chronic. She denies , diarrhea, nausea vomiting, cough.  She was admitted for hypokalemia.  She improved with holding torsemide and potassium supplementation.  Had some mild confusion on hospital day 1 which has resolved, this was thought to be related to OSA/OHS.  She's improved over last few days and was discharged on lower dose of torsemide with potassium supplementation.  Hospital Course:  Acute Metabolic Encephalopathy: mild, she's Shantasia Hunnell&Ox3 at time of my evaluation without focal deficits, but states she woke up SOB and Kripa Foskey bit confused Leonilda Cozby few hrs ago while here.  ? Sleep apnea.  She's hypercarbic, but compensated.  Also has elevated ammonia level and LFT's.   Normal TSH, B12 Ammonia slightly elevated -> unclear etiology.  Received lactulose briefly, but this was d/c'd with no evidence of cirrhosis on imaging.  Continue to monitor.  VBG notable for pCO2 of 73.3, but appears compensated with metabolic alkalosis Improved on discharge.  Respiratory Alkalosis and Metabolic Alkalosis  ? Obesity hypoventilation or OSA:  She has chronic respiratory alkalosis and metabolic alkalosis -> reviewed charts at [facility] which were notable for similar labs.   She may have sleep apnea or obesity hypoventilation.  She has not chart hx of COPD. Would benefit from outpatient sleep study and pulmonary function test  1-Hypokalemia; Improved, d/c on lower dose of torsemide and potassium  2-Chronic diastolic heart failure; She has lost more than 30 pound in 2-week and Ilhan Debenedetto half. Weight up to 282 at discharge BUN and creatinine worse than baseline.  Creatinine on admission 1.8, BUN 42. Creatinine on discharge from [facility] was 1.2 (09/21/2018) (though it appears lasix was held for rising creatinine during this admission) Resume torsemide at 20 daily with 40  meq potassium daily - instructed to f/u closely with PCP and/or cardiology this week for repeat labs and exam Her BNP is within normal limits CXR 8/2 on day of discharge stable CXR without pneumonia or pulm edema Continue with carvedilol.Dose recently reduced by her cardiologist to once Breion Novacek day.  3-AKI on CKD stage;  Baseline creatinine at most recent hospital d/c on 7/2 was 1.2 Patient presented with [place] creatinine of 1.8. Likely related to overdiuresis. Hold torsemide, repeat labs in the morning. Hold on IV fluids to avoid pulmonary edema. Creatinine improving -> resume lower dose at d/c and follow outpatient  # Elevated LFT's  Elevated Ammonia: Follow acute hepatitis panel, HIV, RUQ [place] (unremarkable) Follow INR in morning (wnl).  Normal platelets.  Albumin 3.6. Continue to monitor, could be related to overdiuresis? Relatively stable, follow -> follow outpatient  4-Diabetes; will order SSI. Awaiting med rec to be done.  5-Acute on chronic hypoxic respiratory failure: She relate that she has been using 3 L of oxygen since her last hospitalization. She is on Symbicort unclear if she has Aylissa Heinemann history of COPD Needs outpatient sleep study  6-Hypertension: Continue with carvedilol and hydralazine.  Procedures:  none  Consultations:  none  Discharge Exam: Vitals:   10/22/18 1112 10/22/18 1155  BP: (!) 166/75 (!) 171/79  Pulse: 65 71  Resp: 18 19  Temp: 97.7 F (36.5 C) 97.8 F (36.6 C)  SpO2: 92% 100%   Feels ok. Discussed d/c plan.  General: No acute distress. Cardiovascular: Heart sounds show Dhyana Bastone regular rate, and rhythm. Lungs: Clear to auscultation bilaterally  Abdomen: Soft, nontender, nondistended Neurological: Alert and oriented 3. Moves all extremities 4. Cranial nerves II through XII grossly intact. Skin: Warm and dry. No rashes or lesions. Extremities: No clubbing or cyanosis. Trace edema.  Discharge Instructions   Discharge Instructions    Call  MD for:  difficulty breathing, headache or visual disturbances   Complete by: As directed    Call MD for:  extreme fatigue   Complete by: As directed    Call MD for:  persistant dizziness or light-headedness   Complete by: As directed    Call MD for:  persistant nausea and vomiting   Complete by: As directed    Call MD for:  redness, tenderness, or signs of infection (pain, swelling, redness, odor or green/yellow discharge around incision site)   Complete by: As directed    Call MD for:  severe uncontrolled pain   Complete by: As directed    Call MD for:  temperature >100.4   Complete by: As directed    Diet - low sodium heart healthy   Complete by: As directed    Discharge instructions   Complete by: As directed    You were seen for low potassium.  This improved by holding your torsemide and giving oral potassium.  We will send you home on torsemide once daily.  Please take this with 40 meq of potassium daily.  Check your weight daily.  Call your doctor if you gain 3 lbs in Dalonda Simoni day or 5 lbs in Nazia Rhines week.  Please set up an appointment this week so you can see your PCP or cardiologist to follow up your weight and labs.  You likely have sleep apnea or obesity hypoventilation syndrome.  Weight loss is important.  You also need Maelyn Berrey sleep study as an outpatient.  Ask your PCP about this.  You should also have lung function tests and be evaluated for COPD.  Your liver enzymes were elevated.  Follow this up with your PCP.  Your blood sugars were Rick Carruthers bit elevated.  We sent an A1c which is pending at discharge.  Please follow up with your PCP the results.  Ask your PCP about the portable oxygen concentrator.  Return for new, recurrent, or worsening symptoms.  Please ask your PCP to request records from this hospitalization so they know what was done and what the next steps will be.   Increase activity slowly   Complete by: As directed      Allergies as of 10/22/2018      Reactions   Ace Inhibitors  Cough   Sulfa Antibiotics Swelling      Medication List    TAKE these medications   Adalimumab 40 MG/0.4ML Pskt Inject 40 mg into the skin every 14 (fourteen) days.   albuterol 108 (90 Base) MCG/ACT inhaler Commonly known as: VENTOLIN HFA Inhale 2 puffs into the lungs every 4 (four) hours as needed. What changed: reasons to take this   albuterol 108 (90 Base) MCG/ACT inhaler Commonly known as: VENTOLIN HFA Inhale 2 puffs into the lungs every 6 (six) hours as needed for wheezing or shortness of breath. What changed: You were already taking Rica Heather medication with the same name, and this prescription was added. Make sure you understand how and when to take each.   albuterol (2.5 MG/3ML) 0.083% nebulizer solution Commonly known as: PROVENTIL Take 3 mLs (2.5 mg total) by nebulization every 6 (six) hours as needed for wheezing or shortness of breath. What changed: You were already taking Tonianne Fine medication with the same name, and this prescription was added. Make sure you understand how and when to take each.   aspirin-acetaminophen-caffeine 250-250-65 MG tablet Commonly known as: EXCEDRIN MIGRAINE Take 2 tablets by mouth every 6 (six) hours as needed for headache.   Calcium 600 600 MG Tabs tablet Generic drug: calcium carbonate Take 1 tablet by mouth daily. Notes to patient: Next dose 10/22/2018   carvedilol 6.25 MG tablet Commonly known as: COREG Take 1 tablet (6.25 mg total) by mouth daily. Notes to patient: Next dose 10/22/2018   cholecalciferol 1000 units tablet Commonly known as: VITAMIN D Take 1 tablet (1,000 Units total) by mouth daily. Notes to patient: 10/22/2018   citalopram 40 MG tablet Commonly known as: CELEXA Take 40 mg by mouth daily. Notes to patient: Next dose 10/22/2018   hydrALAZINE 25 MG tablet Commonly known as: APRESOLINE Take 1 tablet (25 mg total) by mouth 2 (two) times Sherley Mckenney day. Notes to patient: Next dose 10/21/2018   hydrOXYzine 25 MG tablet Commonly known as:  ATARAX/VISTARIL Take 25 mg by mouth at bedtime as needed for itching.   Nasacort Allergy 24HR 55 MCG/ACT Aero nasal inhaler Generic drug: triamcinolone Place 2 sprays into the nose daily as needed (congestion).   nitroGLYCERIN 0.4 MG SL tablet Commonly known as: NITROSTAT Place 1 tablet (0.4 mg total) under the tongue every 5 (five) minutes as needed for chest pain.   OXYGEN Inhale 3 L into the lungs continuous.   pantoprazole 40 MG tablet Commonly known as: PROTONIX Take 40 mg by mouth daily. Reported on 07/03/2015 Notes to patient: Next dose 10/22/2018   potassium chloride SA 20 MEQ tablet Commonly known as: Klor-Con M20 Take 2  tablets (40 mEq total) by mouth daily. (please follow up with your PCP within Nissi Doffing few days for repeat labs) What changed:   how much to take  when to take this  additional instructions   Symbicort 160-4.5 MCG/ACT inhaler Generic drug: budesonide-formoterol Inhale 2 puffs into the lungs 2 (two) times daily as needed (wheezing/shortness of breath).   torsemide 20 MG tablet Commonly known as: DEMADEX Take 1 tablet (20 mg total) by mouth daily. What changed: when to take this   traMADol 50 MG tablet Commonly known as: ULTRAM Take 50 mg by mouth every 6 (six) hours as needed for moderate pain.            Durable Medical Equipment  (From admission, onward)         Start     Ordered   10/22/18 1440  For home use only DME Nebulizer machine  Once    Question Answer Comment  Patient needs Orlandria Kissner nebulizer to treat with the following condition Asthma   Length of Need Lifetime      10/22/18 1440         Allergies  Allergen Reactions  . Ace Inhibitors Cough  . Sulfa Antibiotics Swelling      The results of significant diagnostics from this hospitalization (including imaging, microbiology, ancillary and laboratory) are listed below for reference.    Significant Diagnostic Studies: Dg Chest 2 View  Result Date: 10/22/2018 CLINICAL DATA:   Hypoxia. EXAM: CHEST - 2 VIEW COMPARISON:  10/20/2018 FINDINGS: Heart size is enlarged. Left midlung and left base subsegmental atelectasis appears similar to previous exam. No focal airspace consolidation. No pleural effusion or edema. There is mild spondylosis within the upper and midthoracic spine. IMPRESSION: 1. Unchanged left midlung and left base subsegmental atelectasis. 2. Cardiac enlargement. Electronically Signed   By: Kerby Moors M.D.   On: 10/22/2018 15:36   Dg Chest 2 View  Result Date: 10/19/2018 CLINICAL DATA:  Dyspnea and SOB - chart notes:PMH significant for diastolic heart failure, hypertension, chronic kidney disease Gregory Dowe stage 3 prior creatinine 1.3-1.5 who was referred for admission for correction of hypokalemia by her cardiologist. EXAM: CHEST - 2 VIEW COMPARISON:  09/20/2018 FINDINGS: The heart is enlarged. There is minimal atelectasis at the LEFT lung base. Aeration has improved since previous exam. No focal consolidations or pleural effusions. No pulmonary edema. Degenerative changes are seen in thoracic spine. Surgical clips are identified in the LEFT axillary region. IMPRESSION: 1. Cardiomegaly. 2. Minimal LEFT lower lobe atelectasis.  Improved aeration. Electronically Signed   By: Nolon Nations M.D.   On: 10/19/2018 16:11   Dg Chest Port 1 View  Result Date: 10/20/2018 CLINICAL DATA:  Cough and shortness of breath. EXAM: PORTABLE CHEST 1 VIEW COMPARISON:  10/19/2018 and earlier exams. FINDINGS: Stable mild cardiomegaly. Mild linear opacities noted along the left heart border and at the lung bases consistent with atelectasis. Remainder of the lungs is clear. No convincing pleural effusion.  No pneumothorax. IMPRESSION: 1. No change compared to the previous day's study. 2. Cardiomegaly and basilar atelectasis. No convincing pneumonia or pulmonary edema. Electronically Signed   By: Lajean Manes M.D.   On: 10/20/2018 14:19   US Abdomen Limited Ruq  Result Date:  10/20/2018 CLINICAL DATA:  73 year old female with abnormal LFTs. Prior cholecystectomy. EXAM: ULTRASOUND ABDOMEN LIMITED RIGHT UPPER QUADRANT COMPARISON:  None. Chest CT 12/26/2017 [facility] FINDINGS: Gallbladder: Surgically absent, with evidence of the prior cholecystectomy on the 2019 CT. Common bile duct: Diameter:  3 millimeters, normal. Liver: Heterogeneous liver echotexture. Liver echogenicity is at the upper limits of normal to increased (image 21). No discrete liver lesion is evident. No intrahepatic biliary ductal dilatation is evident. Portal vein is patent on color Doppler imaging with normal direction of blood flow towards the liver. Other: Negative visible right kidney.  No free fluid. IMPRESSION: No acute findings on right upper quadrant ultrasound. Electronically Signed   By: Genevie Ann M.D.   On: 10/20/2018 16:55    Microbiology: Recent Results (from the past 240 hour(s))  SARS Coronavirus 2 (Performed in [place] hospital lab)     Status: None   Collection Time: 10/18/18  5:15 PM   Specimen: Nasopharyngeal Swab  Result Value Ref Range Status   SARS Coronavirus 2 NEGATIVE NEGATIVE Final    Comment: (NOTE) If result is NEGATIVE SARS-CoV-2 target nucleic acids are NOT DETECTED. The SARS-CoV-2 RNA is generally detectable in upper and lower  respiratory specimens during the acute phase of infection. The lowest  concentration of SARS-CoV-2 viral copies this assay can detect is 250  copies / mL. Dejha King negative result does not preclude SARS-CoV-2 infection  and should not be used as the sole basis for treatment or other  patient management decisions.  Keonia Pasko negative result may occur with  improper specimen collection / handling, submission of specimen other  than nasopharyngeal swab, presence of viral mutation(s) within the  areas targeted by this assay, and inadequate number of viral copies  (<250 copies / mL). Melburn Treiber negative result must be combined with clinical  observations, patient  history, and epidemiological information. If result is POSITIVE SARS-CoV-2 target nucleic acids are DETECTED. The SARS-CoV-2 RNA is generally detectable in upper and lower  respiratory specimens dur ing the acute phase of infection.  Positive  results are indicative of active infection with SARS-CoV-2.  Clinical  correlation with patient history and other diagnostic information is  necessary to determine patient infection status.  Positive results do  not rule out bacterial infection or co-infection with other viruses. If result is PRESUMPTIVE POSTIVE SARS-CoV-2 nucleic acids MAY BE PRESENT.   Acelynn Dejonge presumptive positive result was obtained on the submitted specimen  and confirmed on repeat testing.  While 2019 novel coronavirus  (SARS-CoV-2) nucleic acids may be present in the submitted sample  additional confirmatory testing may be necessary for epidemiological  and / or clinical management purposes  to differentiate between  SARS-CoV-2 and other Sarbecovirus currently known to infect humans.  If clinically indicated additional testing with an alternate test  methodology [id number]) is advised. The SARS-CoV-2 RNA is generally  detectable in upper and lower respiratory sp ecimens during the acute  phase of infection. The expected result is Negative. Fact Sheet for Patients:  StrictlyIdeas.no Fact Sheet for Healthcare Providers: BankingDealers.co.za This test is not yet approved or cleared by the [place] FDA and has been authorized for detection and/or diagnosis of SARS-CoV-2 by FDA under an Emergency Use Authorization (EUA).  This EUA will remain in effect (meaning this test can be used) for the duration of the COVID-19 declaration under Section 564(b)(1) of the Act, 21 U.S.C. section 360bbb-3(b)(1), unless the authorization is terminated or revoked sooner. Performed at [facility], [address] Madelaine Bhat [place], [place]  [address]      Labs: Basic Metabolic Panel: Recent Labs  Lab 10/18/18 1512  10/19/18 1616 10/20/18 0508 10/20/18 1351 10/21/18 0517 10/22/18 0600  NA 136   < > 137 136 135 138 138  K 2.4*   < > 3.7 2.8* 3.5 3.9 3.8  CL 74*   < > 80* 81* 81* 86* 86*  CO2 47*   < > 42* 41* 39* 41* 41*  GLUCOSE 215*   < > 159* 137* 194* 137* 132*  BUN 43*   < > 46* 41* 39* 38* 35*  CREATININE 1.83*   < > 1.99* 1.68* 1.76* 1.63* 1.46*  CALCIUM 9.5   < > 9.2 9.1 9.0 9.2 9.2  MG 1.7  --   --  2.4  --  2.7* 2.5*  PHOS 2.5  --   --   --   --   --   --    < > = values in this interval not displayed.   Liver Function Tests: Recent Labs  Lab 10/20/18 1351 10/21/18 0517 10/22/18 0600  AST 175* 185* 161*  ALT 149* 157* 153*  ALKPHOS 78 88 78  BILITOT 0.6 0.6 0.6  PROT 6.8 6.8 6.8  ALBUMIN 3.6 3.4* 3.4*   No results for input(s): LIPASE, AMYLASE in the last 168 hours. Recent Labs  Lab 10/20/18 1351 10/21/18 0859 10/22/18 0600  AMMONIA 52* 38* 41*   CBC: Recent Labs  Lab 10/18/18 1512 10/19/18 0931 10/20/18 0508 10/21/18 0517 10/22/18 0600  WBC 8.9 8.5 7.5 7.1 7.3  HGB 15.5* 14.0 13.6 13.5 12.9  HCT 46.8* 41.5 40.0 41.8 40.4  MCV 95.5 95.4 95.9 99.5 99.5  PLT 220 193 165 200 167   Cardiac Enzymes: No results for input(s): CKTOTAL, CKMB, CKMBINDEX, TROPONINI in the last 168 hours. BNP: BNP (last 3 results) Recent Labs    10/21/18 0517  BNP 97.6    ProBNP (last 3 results) Recent Labs    07/31/18 1619 10/03/18 1557 10/10/18 1536  PROBNP 163 319* 134    CBG: Recent Labs  Lab 10/20/18 1336  GLUCAP 202*       Signed:  Fayrene Helper MD.  [facility] 10/22/2018, 4:45 PM

## 2018-10-22 NOTE — TOC Progression Note (Signed)
Transition of Care [facility]) - Progression Note    Patient Details  Name: Donna Shaffer MRN: [id number] Date of Birth: [birth year]  [facility]) CM/SW Contact  Joaquin Courts, RN Phone Number: 10/22/2018, 4:51 PM  Clinical Narrative:    CM spoke with patient who states her daughter will bring a portable O2 tank to the hospital when she comes to pick her up. [employer] arranged to deliver nebulizer machine to bedside for home use.    Expected Discharge Plan: Home/Self Care Barriers to Discharge: No Barriers Identified  Expected Discharge Plan and Services Expected Discharge Plan: Home/Self Care   Discharge Planning Services: CM Consult   Living arrangements for the past 2 months: Single Family Home Expected Discharge Date: 10/22/18               DME Arranged: Nebulizer machine DME Agency: [employer] Date DME Agency Contacted: 10/22/18 Time DME Agency Contacted: [phone] Representative spoke with at DME Agency: Jeneen Rinks HH Arranged: NA [place] Agency: NA         Social Determinants of Health ([place]) Interventions    Readmission Risk Interventions No flowsheet data found.

## 2018-10-22 NOTE — Discharge Instructions (Signed)
Low-Sodium Nutrition Therapy Eating less sodium can help you if you have high blood pressure, heart failure, or kidney or liver disease. Your body needs a little sodium, but too much sodium can cause your body to hold onto extra water. This extra water will raise your blood pressure and can cause damage to your heart, kidneys, or liver as they are forced to work harder. Sometimes you can see how the extra fluid affects you because your hands, legs, or belly swell.  You may also hold water around your heart and lungs, which makes it hard to breathe. Even if you take medication for blood pressure or a water pill (diuretic) to remove fluid, it is still important to have less salt in your diet. Check with your primary care provider before drinking alcohol since it may affect the amount of fluid in your body and how your heart, kidneys, or liver work.  Sodium in Food A low-sodium meal plan limits the sodium that you get from food and beverages to 1,500-2,000 milligrams (mg) per day.  Salt is the main source of sodium. Read the nutrition label on the package to find out how much sodium is in one serving of a food. Select foods with 140 milligrams (mg) of sodium or less per serving. You may be able to eat one or two servings of foods with a little more than 140 milligrams (mg) of sodium if you are closely watching how much sodium you eat in a day. Check the serving size on the label. The amount of sodium listed on the label shows the amount in one serving of the food.  So, if you eat more than one serving, you will get more sodium than the amount listed.  Cutting Back on Sodium Eat more fresh foods. Fresh fruits and vegetables are low in sodium, as well as frozen vegetables and fruits that have no added juices or sauces. Fresh meats are lower in sodium than processed meats, such as bacon, sausage, and hotdogs. Not all processed foods are unhealthy, but some processed foods may have too much sodium. Eat  less salt at the table and when cooking. One of the ingredients in salt is sodium. One teaspoon of table salt has 2,300 milligrams of sodium. Leave the salt out of recipes for pasta, casseroles, and soups. Be a Paramedic. Food packages that say Salt-free, sodium-free, very low sodium, and low sodium have less than 140 milligrams of sodium per serving. Beware of products identified as Unsalted, No Salt Added, Reduced Sodium, or Lower Sodium. These items may still be high in sodium.  You should always check the nutrition label. Add flavors to your food without adding sodium. Try lemon juice, lime juice, or vinegar. Dry or fresh herbs add flavor. Buy a sodium-free seasoning blend or make your own at home. You can purchase salt-free or sodium-free condiments like barbeque sauce in stores and online.   Eating in Restaurants Choose foods carefully when you eat outside your home.  Restaurant foods can be very high in sodium.  Many restaurants provide nutrition facts on their menus or their websites.  If you cannot find that information, ask your server.  Let your server know that you want your food to be cooked without salt and that you would like your salad dressing and sauces to be served on the side.  Foods Recommended Grains Bread, bagels, rolls without salted tops Homemade bread made with reduced-sodium baking powder Cold cereals, especially shredded wheat and puffed rice Oats, grits,  or cream of wheat Pastas, quinoa, and rice Popcorn, pretzels or crackers without salt Corn tortillas Protein Foods Fresh meats and fish; [place] bacon (check the nutrition labels - make sure they are not packaged in a sodium solution) Canned or packed tuna (no more than 4 ounces at 1 serving) Beans and peas Soybeans) and tofu Eggs Nuts or nut butters without salt Dairy Milk or milk powder Plant milks, such as rice and soy Yogurt, including Greek yogurt Small amounts of natural  cheese (blocks of cheese) or reduced-sodium cheese can be used in moderation. (Swiss, ricotta, and fresh mozzarella cheese are lower in sodium than the others) Cream cheese Low sodium cottage cheese Vegetables Fresh and frozen vegetables without added sauces or salt Homemade soups (without salt) Low-sodium, salt-free or sodium-free canned vegetables and soups Fruit Fresh and canned fruits Dried fruits, such as raisins, cranberries, and prunes Oils Tub or liquid margarine, regular or without salt Canola, corn, peanut, olive, safflower, or sunflower oils Condiments Fresh or dried herbs such as basil, bay leaf, dill, mustard (dry), nutmeg, paprika, parsley, rosemary, sage, or thyme. Low sodium ketchup Vinegar Lemon or lime juice Pepper, red pepper flakes, and cayenne. Hot sauce contains sodium, but if you use just a drop or two, it will not add up to much. Salt-free or sodium-free seasoning mixes and marinades Simple salad dressings: vinegar and oil  Foods Not Recommended Grains Breads or crackers topped with salt Cereals (hot/cold) with more than 300 mg sodium per serving Biscuits, cornbread, and other quick breads prepared with baking soda Pre-packaged bread crumbs Seasoned and packaged rice and pasta mixes Self-rising flours Protein Foods Cured meats: Bacon, ham, sausage, pepperoni and hot dogs Canned meats (chili, vienna sausage, or sardines) Smoked fish and meats Frozen meals that have more than 600 mg of sodium per serving Egg substitute (with added sodium) Dairy Buttermilk Processed cheese spreads Cottage cheese (1 cup may have over 500 mg of sodium; look for low-sodium.) American or feta cheese Shredded Cheese has more sodium than blocks of cheese String cheese Vegetables Canned vegetables (unless they are salt-free, sodium-free or low sodium) Frozen vegetables with seasoning and sauces Sauerkraut and pickled vegetables Canned or dried soups (unless they are  salt-free, sodium-free, or low sodium) [place] fries and onion rings Fruit  Dried fruits preserved with additives that have sodium Oils  Salted butter or margarine, all types of olives Condiments Salt, sea salt, kosher salt, onion salt, and garlic salt Seasoning mixes with salt Bouillon cubes Ketchup Barbeque sauce and Worcestershire sauce unless low sodium Soy sauce Salsa, pickles, olives, relish Salad dressings: ranch, blue cheese, [place], and [place].  Low Sodium Sample 1-Day Menu Breakfast 1 cup cooked oatmeal 1 slice whole wheat bread toast 1 tablespoon peanut butter without salt 1 banana 1 cup 1% milk Lunch Tacos made with: 2 corn tortillas  cup black beans, low sodium  cup roasted or grilled chicken (without skin)  avocado Squeeze of lime juice 1 cup salad greens 1 tablespoon low-sodium salad dressing  cup strawberries 1 orange Afternoon Snack 1/3 cup grapes 6 ounces yogurt Evening Meal 3 ounces herb-baked fish 1 baked potato 2 teaspoons olive oil  cup cooked carrots 2 thick slices tomatoes on: 2 lettuce leaves 1 teaspoon olive oil 1 teaspoon balsamic vinegar 1 cup 1% milk Evening Snack 1 apple  cup almonds without salt   Sodium-Free Flavoring Tips When cooking, the following items may be used for flavoring instead of salt or seasonings that contain sodium. Remember: A little  bit of spice goes a long way! Be careful not to overseason. Spice Blend Recipe (makes about 1/2 cup) 5 teaspoons onion powder 2 teaspoons garlic powder 2 teaspoons paprika 2 teaspoon dry mustard 1 teaspoon crushed thyme leaves  teaspoon white pepper  teaspoon celery seed  Food Item Flavorings Beef  Basil, bay leaf, caraway, curry, dill, dry mustard, garlic, grape jelly, green pepper, mace, marjoram, mushrooms (fresh), nutmeg, onion or onion powder, parsley, pepper, rosemary, sage Chicken  Basil, cloves, cranberries, mace, mushrooms (fresh), nutmeg, oregano,  paprika, parsley, pineapple, saffron, sage, savory, tarragon, thyme, tomato, turmeric Egg  Chervil, curry, dill, dry mustard, garlic or garlic powder, green pepper, jelly, mushrooms (fresh), nutmeg, onion powder, paprika, parsley, rosemary, tarragon, tomato Fish  Basil, bay leaf, chervil, curry, dill, dry mustard, green pepper, lemon juice, marjoram, mushrooms (fresh), paprika, pepper, tarragon, tomato, turmeric Lamb  Cloves, curry, dill, garlic or garlic powder, mace, mint, mint jelly, onion, oregano, parsley, pineapple, rosemary, tarragon, thyme Pork  Applesauce, basil, caraway, chives, cloves, garlic or garlic powder, onion or onion powder, rosemary, thyme Veal Apricots, basil, bay leaf, currant jelly, curry, ginger, marjoram, mushrooms (fresh), oregano, paprika Vegetables  Basil, dill, garlic or garlic powder, ginger, lemon juice, mace, marjoram, nutmeg, onion or onion powder, tarragon, tomato, sugar or sugar substitute, salt-free salad dressing, vinegar Desserts  Allspice, anise, cinnamon, cloves, ginger, mace, nutmeg, vanilla extract, other extracts   Sodium (Salt) Content of Foods Eating more than the serving size for a moderate or low-sodium food will make it a high-sodium food. Foods made with high-sodium foods will also be high in sodium. Unless otherwise noted, all foods are cooked: meat is roasted, fish is cooked with dry heat, and vegetables are cooked from fresh and fruit is raw. This is a guide. Actual values may vary depending on product and/or processing.  Canned and processed foods may have a higher sodium content. Values are rounded to the nearest 5-milligram (mg) increment and may be averaged with similar foods in the group.  High Sodium (more than 300 mg) Food Serving Milligrams (mg) Bacon 2 slices 956 Bagel, 4?: egg 1 each 450 Bagel, 4?: plain, onion, or seeded 1 each 400 Barbecue sauce 2 Tbsp 350 Beans, baked, plain  cup 435 Beans, garbanzo  cup 360 Beans,  kidney, canned  cup 440 Beans, lima, canned  cup 405 Beans, white, canned  cup 445 Beef, dried 1 oz. 790 Biscuit, 2? 1 each 350 Catsup 2 Tbsp 335 Cheese, American 1 oz 400 Cheese, cottage  cup 460 Cheese, feta 1 oz 315 Corn, creamed, canned  cup 365 Croissant 2 oz 425 Fish, salmon, canned 3 oz 470 Fish, salmon, smoked 3 oz 670 Fish, sardines, canned 3 oz 430 Frankfurter, beef or pork 1 each 510 Ham 3 oz 1,125 Lobster 3 oz 325 Miso  cup 1,280 Mushrooms, canned  cup 330 Pickle, dill 1 large 570 Potatoes, au gratin or scalloped  cup 500 Pretzels 1 oz 400 Pudding, instant, chocolate, prepared with milk  cup 420 Salad dressing, [place], commercial 2 Tbsp 485 Salami, dry or hard 1 oz 600 Salt, table 1 tsp 2,325 Sauerkraut, canned  cup 780 Soup, canned 1 cup 700-1,000 Soy sauce 1 Tbsp 900 Spinach, canned, drained  cup 345 Teriyaki sauce 1 Tbsp 690 Tomato or vegetable juice, canned  cup 325 Tomato sauce, canned  cup 640 Tomato sauce, spaghetti or marinara  cup 510 Vegetable or soy patty 1 each 380  Moderate Sodium (140-300 mg) Food Serving Milligrams (mg) Asparagus, canned  4 spears 205 Beans, green or yellow, canned  cup 175 Beets, canned  cup 160 Bologna, pork and beef 1 oz 210 Bread, pita, 4? 1 each 150 Bread, pumpernickel or rye 1 slice 798 Bread, white 1 slice 921 Carrots, canned  cup 175 Cereal, raisin bran  cup 175 Cheese: [place], mozzarella, cheddar 1 oz 175 Cheese, Parmesan 2 Tbsp 150 Cheese, provolone, part-skim 1 oz 250 Cheese, ricotta  cup 155 Corn, canned  cup 285 Crab, canned 3 oz 240 English muffin 1 each 250 French fries 10 fries 200 Greens, beet  cup 175 Milk, buttermilk 1 cup 260 Milk, chocolate 1 cup 165 Milkshake 8 oz 240 Muffin 2 oz 250 Nuts, mixed, salted 1 oz 190 Olives, ripe, canned 5 large 190 Pancake or waffle, 4? 1 each 240 Peanuts, salted 1 oz 230 Peas, green, canned  cup 215 Potato chips 1 oz 190 Potatoes,  mashed, prepared from dry mix  cup 170 Pudding, ready-to-eat  cup 160 Pudding, vanilla, from mix  cup 225 Roll, hot dog or hamburger 1 each 205 Salad dressing 2 Tbsp 200-300 Salsa 2 Tbsp 195 Sausage, pork 1 oz 200 Tomatoes, canned  cup 170 Tomatoes, stewed, canned  cup 280 Tortilla, flour, 6? 1 each 205 Tuna, canned in water 3 oz 290 Yogurt, plain or fruited 8 oz 100-175  Low Sodium (less than 140 mg) Food Serving Milligrams (mg) Bread, Italian 1 slice 194 Bread, wheat 1 slice 174 Butter, salted 1 Tbsp 90 Cereal, breakfast: corn, bran, or wheat  cup 100-150 Cheese, Swiss 1 oz 55 Egg substitute, liquid  cup 120 Egg, whole 1 large 70 Fish: pollock, swordfish, perch, cod, halibut, roughy, salmon 3 oz 60-100 Frozen yogurt  cup 65 Gelatin, prepared from mix  cup 100 Ice cream  cup 55 Margarine, regular 1 Tbsp 135 Milk, all types 1 cup 100 Milk, evaporated, canned  cup 135 Mustard 1 tsp 55 Peanut butter 1 Tbsp 75 Peas, green, frozen  cup 60 Seeds, sunflower 1 oz 115 Soy milk 1 cup 125 Spinach  cup 65 Spinach, frozen  cup 90 Sweet potato, baked in skin 1 medium 40 [place], light or dark meat 3 oz 60 Yogurt, plain or fruited 8 oz 100-175  Very Low Sodium (less than 35 mg) Food Serving Milligrams (mg) Apricots, canned  cup 5 Beef, ground 1 oz. 20 Beer, regular 12 oz 15 Broccoli  cup 30 Broccoli, raw  cup 15 Brussels sprouts  cup 15 Cabbage, raw or cooked  cup 5 Carbonated beverages 12 oz 20-40 Cauliflower  cup 10 Cauliflower, raw  cup 15 Dried beans and peas  cup 5-20 Greens: beet, collard, mustard  cup 10-20 Honeydew  cup 30 Lettuce, leaf 1 cup 15 Noodles  cup 10 Oatmeal  cup 5 Peaches, canned  cup 5 Pears, canned  cup 5 Pork 1 oz 25 Potato, baked with skin 1 medium 20 Rice, brown or wild  cup 5 Sherbet  cup 35 Soybeans  cup 15 Spinach, raw 1 cup 25 Tofu, firm  cup 10 Wine, table, all types 5 oz 10  Sodium Free (less than 5  mg) Food Serving Avocado 1 oz Beans: navy, black, pinto  cup Nuts: almonds, pecans, or walnuts, unsalted 1 oz Oil, all types 1 Tbsp Popcorn, air popped 1 cup Raisins, seedless  cup Rice, white  cup Tomato, raw 1 medium Fruit and juices not previously listed 1 piece or  cup Vegetables not previously listed  cup

## 2018-10-22 NOTE — Progress Notes (Signed)
SATURATION QUALIFICATIONS:   Patient Saturations on Room Air at Rest = 90 %  Patient Saturations on Room Air while Ambulating = 88 %  Patient Saturations on 2  Liters of oxygen while Ambulating = 96 %  Please briefly explain why patient needs home oxygen: At rest oxygen saturation 90-92 % at rest.  Patient reports feeling SOB at the start of activity. Pt ambulated 20 ft and asked to return to room. Oxygen saturation on room air while ambulating decreased to 88%. 2lt O2 applied and o2 sat increased 94-96%.

## 2018-11-13 NOTE — Progress Notes (Signed)
Cardiology Office Note:    Date:  11/14/2018   ID:  Donna Shaffer, Donna Shaffer 10-Aug-1945, MRN [id number]  PCP:  Myrlene Broker, MD  Cardiologist:  Shirlee More, MD    Referring MD: Myrlene Broker, MD    ASSESSMENT:    1. Hypokalemia   2. Chronic diastolic heart failure ([place])   3. 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 Obesity hypoventilation syndrome ([place])   5. Obstructive sleep apnea    PLAN:    In order of problems listed above:  1. Improved continue potassium supplements diuretics are decreased 2. Improved euvolemic continue current diuretic 3. Stable recent labs with her PCP reviewed K is normal creatinine stable 4. Weight evaluation for sleep apnea and obesity hypoventilation syndrome and treatment in the interim I asked her to limit her discontinue Ultram and with heart failure she can no longer take Humira.   Next appointment: 3 months   Medication Adjustments/Labs and Tests Ordered: Current medicines are reviewed at length with the patient today.  Concerns regarding medicines are outlined above.  No orders of the defined types were placed in this encounter.  No orders of the defined types were placed in this encounter.   Chief Complaint  Patient presents with  . Follow-up  . Congestive Heart Failure    History of Present Illness:    Donna Shaffer is a 73 y.o. female with a hx of diastolic heart failure, hypertension, Type 2 DM  and chest pain with a normal myocardial perfusion study at [facility] 09/19/2018  last seen 10/10/2018.  She was subsequently admitted to [facility] with severe refractory hypokalemia.  Inpatient she received supplementation her diuretics were held and reinstitute a lower dose.  Initially she had mild confusion that was felt to be related to sleep apnea.  She was recommended to have PFTs and a  sleep study as outpatient.  Arterial blood gas showed a pH 7.41 and a PCO2 of 73 PO2 73. Compliance with diet, lifestyle and medications: Yes  Although there is hypertension and heart failure think most of her problem is obstructive sleep apnea and hypoventilation syndrome.  She is scheduled for follow-up 11/21/2018 and I think positive pressure modalities with or without BiPAP of the solution.  In the interim she is not retaining fluid her potassium recheck a week ago was 4.0 no chest pain palpitation or syncope complains of being very fatigued and excessive daytime sleeping. Past Medical History:  Diagnosis Date  . Anxiety   . Breast cancer ([place])    left  . Cancer ([place])   . Colon polyps   . Depression   . Hypertension   . Personal history of radiation therapy   . Pneumonia   . Radiation 04/09/15-05/23/15   left breast 50.4 gray, lumpectomy cavity boost 10 gray  . Type 2 diabetes mellitus with hyperglycemia, without long-term current use of insulin ([place]) 11/24/2016    Past Surgical History:  Procedure Laterality Date  . ABDOMINAL HYSTERECTOMY    . APPENDECTOMY    . BILATERAL OOPHORECTOMY    . BREAST LUMPECTOMY Left 02/2015  . CATARACT EXTRACTION Bilateral   . CHOLECYSTECTOMY    . RADIOACTIVE SEED GUIDED PARTIAL MASTECTOMY WITH AXILLARY SENTINEL LYMPH NODE BIOPSY Left 02/28/2015   Procedure: RADIOACTIVE SEED GUIDED PARTIAL MASTECTOMY WITH AXILLARY SENTINEL LYMPH NODE BIOPSY;  Surgeon: Autumn Messing III, MD;  Location:  [facility];  Service: General;  Laterality: Left;    Current Medications: Current Meds  Medication Sig  . Adalimumab 40 MG/0.4ML PSKT Inject 40 mg into the skin every 14 (fourteen) days.  Marland Kitchen albuterol (PROVENTIL) (2.5 MG/3ML) 0.083% nebulizer solution Take 3 mLs (2.5 mg total) by nebulization every 6 (six) hours as needed for wheezing or shortness of breath.  Marland Kitchen albuterol (VENTOLIN HFA) 108 (90 Base) MCG/ACT inhaler Inhale 2 puffs into the lungs every 4 (four)  hours as needed.  Marland Kitchen aspirin-acetaminophen-caffeine (EXCEDRIN MIGRAINE) 250-250-65 MG tablet Take 2 tablets by mouth every 6 (six) hours as needed for headache.  . budesonide-formoterol (SYMBICORT) 160-4.5 MCG/ACT inhaler Inhale 2 puffs into the lungs 2 (two) times daily as needed (wheezing/shortness of breath).   . calcium carbonate (CALCIUM 600) 600 MG TABS tablet Take 1 tablet by mouth daily.  . carvedilol (COREG) 6.25 MG tablet Take 1 tablet (6.25 mg total) by mouth daily.  . cholecalciferol (VITAMIN D) 1000 units tablet Take 1 tablet (1,000 Units total) by mouth daily.  . citalopram (CELEXA) 40 MG tablet Take 40 mg by mouth daily.  . hydrALAZINE (APRESOLINE) 25 MG tablet Take 1 tablet (25 mg total) by mouth 2 (two) times a day.  . hydrOXYzine (ATARAX/VISTARIL) 25 MG tablet Take 25 mg by mouth at bedtime as needed for itching.   . nitroGLYCERIN (NITROSTAT) 0.4 MG SL tablet Place 1 tablet (0.4 mg total) under the tongue every 5 (five) minutes as needed for chest pain.  . OXYGEN Inhale 3 L into the lungs continuous.  . pantoprazole (PROTONIX) 40 MG tablet Take 40 mg by mouth daily. Reported on 07/03/2015  . potassium chloride SA (KLOR-CON M20) 20 MEQ tablet Take 2 tablets (40 mEq total) by mouth daily. (please follow up with your PCP within a few days for repeat labs)  . torsemide (DEMADEX) 20 MG tablet Take 1 tablet (20 mg total) by mouth daily.  . traMADol (ULTRAM) 50 MG tablet Take 50 mg by mouth every 6 (six) hours as needed for moderate pain.   Marland Kitchen triamcinolone (NASACORT ALLERGY 24HR) 55 MCG/ACT AERO nasal inhaler Place 2 sprays into the nose daily as needed (congestion).     Allergies:   Ace inhibitors and Sulfa antibiotics   Social History   Socioeconomic History  . Marital status: Single    Spouse name: Not on file  . Number of children: 2  . Years of education: Not on file  . Highest education level: Not on file  Occupational History  . Occupation: Proofreader  Social  Needs  . Financial resource strain: Not on file  . Food insecurity    Worry: Not on file    Inability: Not on file  . Transportation needs    Medical: Not on file    Non-medical: Not on file  Tobacco Use  . Smoking status: Never Smoker  . Smokeless tobacco: Never Used  Substance and Sexual Activity  . Alcohol use: No  . Drug use: No  . Sexual activity: Not on file  Lifestyle  . Physical activity    Days per week: Not on file    Minutes per session: Not on file  . Stress: Not on file  Relationships  . Social Herbalist on phone: Not on file    Gets together: Not on file    Attends religious service: Not on file    Active member of club or organization: Not on file  Attends meetings of clubs or organizations: Not on file    Relationship status: Not on file  Other Topics Concern  . Not on file  Social History Narrative  . Not on file     Family History: The patient's family history includes Melanoma in her mother; Skin cancer in her father. ROS:   Please see the history of present illness.    All other systems reviewed and are negative.  EKGs/Labs/Other Studies Reviewed:    The following studies were reviewed today  Recent Labs: 10/10/2018: NT-Pro BNP 134 10/20/2018: TSH 2.056 10/21/2018: B Natriuretic Peptide 97.6 10/22/2018: ALT 153; BUN 35; Creatinine, Ser 1.46; Hemoglobin 12.9; Magnesium 2.5; Platelets 167; Potassium 3.8; Sodium 138  Recent Lipid Panel No results found for: CHOL, TRIG, HDL, CHOLHDL, VLDL, LDLCALC, LDLDIRECT  Physical Exam:    VS:  BP (!) 148/76 (BP Location: Right Arm, Patient Position: Sitting, Cuff Size: Large)   Pulse 64   Ht 5\' 2"  (1.575 m)   Wt 281 lb (127.5 kg)   SpO2 99%   BMI 51.40 kg/m     Wt Readings from Last 3 Encounters:  11/14/18 281 lb (127.5 kg)  10/22/18 282 lb (127.9 kg)  10/10/18 279 lb 1.9 oz (126.6 kg)     GEN: Morbid obesity BMI greater than 50 well nourished, well developed in no acute distress HEENT:  Normal NECK: No JVD; No carotid bruits LYMPHATICS: No lymphadenopathy CARDIAC: RRR, no murmurs, rubs, gallops RESPIRATORY:  Clear to auscultation without rales, wheezing or rhonchi  ABDOMEN: Soft, non-tender, non-distended MUSCULOSKELETAL:  No edema; No deformity  SKIN: Warm and dry NEUROLOGIC:  Alert and oriented x 3 PSYCHIATRIC:  Normal affect    Signed, Shirlee More, MD  11/14/2018 2:38 PM    [facility]

## 2018-11-14 ENCOUNTER — Ambulatory Visit (INDEPENDENT_AMBULATORY_CARE_PROVIDER_SITE_OTHER): Payer: Medicare Other | Admitting: Cardiology

## 2018-11-14 ENCOUNTER — Encounter: Payer: Self-pay | Admitting: Cardiology

## 2018-11-14 ENCOUNTER — Other Ambulatory Visit: Payer: Self-pay

## 2018-11-14 VITALS — BP 148/76 | HR 64 | Ht 62.0 in | Wt 281.0 lb

## 2018-11-14 DIAGNOSIS — E662 Morbid (severe) obesity with alveolar hypoventilation: Secondary | ICD-10-CM

## 2018-11-14 DIAGNOSIS — G4733 Obstructive sleep apnea (adult) (pediatric): Secondary | ICD-10-CM

## 2018-11-14 DIAGNOSIS — I5032 Chronic diastolic (congestive) heart failure: Secondary | ICD-10-CM

## 2018-11-14 DIAGNOSIS — I130 Hypertensive heart and chronic kidney disease with heart failure and stage 1 through stage 4 chronic kidney disease, or unspecified chronic kidney disease: Secondary | ICD-10-CM | POA: Diagnosis not present

## 2018-11-14 DIAGNOSIS — E876 Hypokalemia: Secondary | ICD-10-CM | POA: Diagnosis not present

## 2018-11-14 NOTE — Patient Instructions (Signed)
Medication Instructions:  Your physician has recommended you make the following change in your medication:   STOP humera  **Take ultram as little as possible!   If you need a refill on your cardiac medications before your next appointment, please call your pharmacy.   Lab work: None  If you have labs (blood work) drawn today and your tests are completely normal, you will receive your results only by: Marland Kitchen MyChart Message (if you have MyChart) OR . A paper copy in the mail If you have any lab test that is abnormal or we need to change your treatment, we will call you to review the results.  Testing/Procedures: None  Follow-Up: At [facility], you and your health needs are our priority.  As part of our continuing mission to provide you with exceptional heart care, we have created designated Provider Care Teams.  These Care Teams include your primary Cardiologist (physician) and Advanced Practice Providers (APPs -  Physician Assistants and Nurse Practitioners) who all work together to provide you with the care you need, when you need it. You will need a follow up appointment in 3 months.

## 2018-11-14 NOTE — Addendum Note (Signed)
Addended by: Stevan Born on: 11/14/2018 02:45 PM   Modules accepted: Orders

## 2018-12-18 ENCOUNTER — Other Ambulatory Visit: Payer: Self-pay | Admitting: General Surgery

## 2018-12-18 DIAGNOSIS — Z853 Personal history of malignant neoplasm of breast: Secondary | ICD-10-CM
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
## 2019-02-19 ENCOUNTER — Ambulatory Visit (INDEPENDENT_AMBULATORY_CARE_PROVIDER_SITE_OTHER): Payer: Medicare Other | Admitting: Cardiology

## 2019-02-19 ENCOUNTER — Other Ambulatory Visit: Payer: Self-pay

## 2019-02-19 ENCOUNTER — Encounter: Payer: Self-pay | Admitting: Cardiology

## 2019-02-19 VITALS — BP 140/60 | HR 66 | Ht 62.0 in | Wt 263.0 lb

## 2019-02-19 DIAGNOSIS — E876 Hypokalemia: Secondary | ICD-10-CM | POA: Diagnosis not present

## 2019-02-19 DIAGNOSIS — J9612 Chronic respiratory failure with hypercapnia: Secondary | ICD-10-CM

## 2019-02-19 DIAGNOSIS — I5032 Chronic diastolic (congestive) heart failure: Secondary | ICD-10-CM | POA: Diagnosis not present

## 2019-02-19 DIAGNOSIS — J9611 Chronic respiratory failure with hypoxia: Secondary | ICD-10-CM | POA: Diagnosis not present

## 2019-02-19 DIAGNOSIS — I130 Hypertensive heart and chronic kidney disease with heart failure and stage 1 through stage 4 chronic kidney disease, or unspecified chronic kidney disease: Secondary | ICD-10-CM

## 2019-02-19 NOTE — Patient Instructions (Signed)
Medication Instructions:  Your physician has recommended you make the following change in your medication:  STOP hydroxyzine   START over-the-counter Claritin or Zyrtec as needed for allergies  *If you need a refill on your cardiac medications before your next appointment, please call your pharmacy*  Lab Work: Your physician recommends that you return for lab work today: BMP, ProBNP, Magnesium level.   If you have labs (blood work) drawn today and your tests are completely normal, you will receive your results only by: Marland Kitchen MyChart Message (if you have MyChart) OR . A paper copy in the mail If you have any lab test that is abnormal or we need to change your treatment, we will call you to review the results.  Testing/Procedures: None  Follow-Up: At [facility], you and your health needs are our priority.  As part of our continuing mission to provide you with exceptional heart care, we have created designated Provider Care Teams.  These Care Teams include your primary Cardiologist (physician) and Advanced Practice Providers (APPs -  Physician Assistants and Nurse Practitioners) who all work together to provide you with the care you need, when you need it.  Your next appointment:   3 month(s)  The format for your next appointment:   In Person  Provider:   Shirlee More, MD

## 2019-02-20 ENCOUNTER — Emergency Department (HOSPITAL_COMMUNITY)
Admission: EM | Admit: 2019-02-20 | Discharge: 2019-02-20 | Disposition: A | Discharge status: Home / Self Care | Payer: Medicare Other | Attending: Emergency Medicine | Admitting: Emergency Medicine

## 2019-02-20 ENCOUNTER — Other Ambulatory Visit: Payer: Self-pay

## 2019-02-20 ENCOUNTER — Emergency Department (HOSPITAL_COMMUNITY): Payer: Medicare Other

## 2019-02-20 ENCOUNTER — Encounter (HOSPITAL_COMMUNITY): Payer: Self-pay | Admitting: Emergency Medicine

## 2019-02-20 DIAGNOSIS — S4991XA Unspecified injury of right shoulder and upper arm, initial encounter: Secondary | ICD-10-CM | POA: Diagnosis present

## 2019-02-20 DIAGNOSIS — I5032 Chronic diastolic (congestive) heart failure: Secondary | ICD-10-CM | POA: Diagnosis not present

## 2019-02-20 DIAGNOSIS — Z79899 Other long term (current) drug therapy: Secondary | ICD-10-CM | POA: Diagnosis not present

## 2019-02-20 DIAGNOSIS — E1122 Type 2 diabetes mellitus with diabetic chronic kidney disease: Secondary | ICD-10-CM | POA: Diagnosis not present

## 2019-02-20 DIAGNOSIS — N184 Chronic kidney disease, stage 4 (severe): Secondary | ICD-10-CM | POA: Diagnosis not present

## 2019-02-20 DIAGNOSIS — Y92531 Health care provider office as the place of occurrence of the external cause: Secondary | ICD-10-CM | POA: Diagnosis not present

## 2019-02-20 DIAGNOSIS — I130 Hypertensive heart and chronic kidney disease with heart failure and stage 1 through stage 4 chronic kidney disease, or unspecified chronic kidney disease: Secondary | ICD-10-CM | POA: Diagnosis not present

## 2019-02-20 DIAGNOSIS — S42211A Unspecified displaced fracture of surgical neck of right humerus, initial encounter for closed fracture: Secondary | ICD-10-CM | POA: Diagnosis not present

## 2019-02-20 DIAGNOSIS — Y9301 Activity, walking, marching and hiking: Secondary | ICD-10-CM | POA: Insufficient documentation

## 2019-02-20 DIAGNOSIS — Y999 Unspecified external cause status: Secondary | ICD-10-CM | POA: Diagnosis not present

## 2019-02-20 DIAGNOSIS — W010XXA Fall on same level from slipping, tripping and stumbling without subsequent striking against object, initial encounter: Secondary | ICD-10-CM | POA: Insufficient documentation

## 2019-02-20 LAB — BASIC METABOLIC PANEL
BUN/Creatinine Ratio: 20 (ref 12–28)
BUN: 23 mg/dL (ref 8–27)
CO2: 33 mmol/L — ABNORMAL HIGH (ref 20–29)
Calcium: 9.6 mg/dL (ref 8.7–10.3)
Chloride: 93 mmol/L — ABNORMAL LOW (ref 96–106)
Creatinine, Ser: 1.16 mg/dL — ABNORMAL HIGH (ref 0.57–1.00)
GFR calc Af Amer: 54 mL/min/{1.73_m2} — ABNORMAL LOW (ref >59)
GFR calc non Af Amer: 47 mL/min/{1.73_m2} — ABNORMAL LOW (ref >59)
Glucose: 105 mg/dL — ABNORMAL HIGH (ref 65–99)
Potassium: 4.3 mmol/L (ref 3.5–5.2)
Sodium: 138 mmol/L (ref 134–144)

## 2019-02-20 LAB — MAGNESIUM: Magnesium: 2 mg/dL (ref 1.6–2.3)

## 2019-02-20 LAB — PRO B NATRIURETIC PEPTIDE: NT-Pro BNP: 593 pg/mL — ABNORMAL HIGH (ref 0–301)

## 2019-02-20 MED ORDER — HYDROCODONE-ACETAMINOPHEN 5-325 MG PO TABS
1.0000 | ORAL_TABLET | Freq: Once | ORAL | Status: AC
  Administered 2019-02-20: 1 via ORAL
  Filled 2019-02-20: qty 1

## 2019-02-20 MED ORDER — HYDROCODONE-ACETAMINOPHEN 5-325 MG PO TABS: 2.0000 | ORAL_TABLET | ORAL | 0 refills | Status: DC | PRN

## 2019-02-20 MED ORDER — MORPHINE SULFATE (PF) 4 MG/ML IV SOLN
4.0000 mg | Freq: Once | INTRAVENOUS | Status: AC
  Administered 2019-02-20: 4 mg via INTRAMUSCULAR
  Filled 2019-02-20: qty 1

## 2019-02-20 NOTE — ED Triage Notes (Signed)
Per [facility] pt coming from her doctors office after while walking to bathroom she tripped and fell on her right shoulder. C/o right shoulder/arm pain and bilateral knee pain. Denies hitting head or any LOC. Patient on 3L [place] at all times. Alert and orientated x4.

## 2019-02-20 NOTE — ED Provider Notes (Signed)
[facility] EMERGENCY DEPARTMENT Provider Note   CSN: [id number] Arrival date & time: 02/20/19  1208     History   Chief Complaint Chief Complaint  Patient presents with   Fall    HPI Donna Shaffer is a 73 y.o. female.     Patient is a 73 year old female with past medical history of breast cancer, depression, hypertension, anxiety, type 2 diabetes presenting to the emergency department for right shoulder pain after mechanical fall.  Patient reports that she was walking into her oncology's office today and she tripped over a rug in front of the door.  She fell forward onto the tile floor.  Reports that she hit her right shoulder directly and then her knees.  She reports that she did not hit her head or pass out.  She is not having any neck pain.  No numbness, tingling, weakness.  Reports that she is able to be ambulatory without pain in her knees and that the only issue she is having is her right shoulder pain.     Past Medical History:  Diagnosis Date   Anxiety    Breast cancer ([place])    left   Cancer ([place])    Colon polyps    Depression    Hypertension    Personal history of radiation therapy    Pneumonia    Radiation 04/09/15-05/23/15   left breast 50.4 gray, lumpectomy cavity boost 10 gray   Type 2 diabetes mellitus with hyperglycemia, without long-term current use of insulin ([place]) 11/24/2016    Patient Active Problem List   Diagnosis Date Noted   Elevated liver enzymes    HTN (hypertension) 10/19/2018   Hypokalemia 10/18/2018   Chronic diastolic CHF (congestive heart failure) ([place]) 10/03/2018   Abnormal chest x-ray 12/20/2017   Hypertensive heart and chronic kidney disease with heart failure and stage 1 through stage 4 chronic kidney disease, or chronic kidney disease ([place]) 12/14/2017   Chest pain 12/14/2017   SOB (shortness of breath) on exertion 12/14/2017   GERD without esophagitis 11/24/2016   Type 2 diabetes mellitus  with other specified complication ([place]) 94/49/6759   Obesity, morbid, BMI 40.0-49.9 ([place]) 08/26/2015   Breast cancer of upper-outer quadrant of left female breast ([place]) 03/11/2015    Past Surgical History:  Procedure Laterality Date   ABDOMINAL HYSTERECTOMY     APPENDECTOMY     BILATERAL OOPHORECTOMY     BREAST LUMPECTOMY Left 02/2015   CATARACT EXTRACTION Bilateral    CHOLECYSTECTOMY     RADIOACTIVE SEED GUIDED PARTIAL MASTECTOMY WITH AXILLARY SENTINEL LYMPH NODE BIOPSY Left 02/28/2015   Procedure: RADIOACTIVE SEED GUIDED PARTIAL MASTECTOMY WITH AXILLARY SENTINEL LYMPH NODE BIOPSY;  Surgeon: Autumn Messing III, MD;  Location: [facility];  Service: General;  Laterality: Left;     OB History   No obstetric history on file.      Home Medications    Prior to Admission medications   Medication Sig Start Date End Date Taking? Authorizing Provider  albuterol (PROVENTIL) (2.5 MG/3ML) 0.083% nebulizer solution Take 3 mLs (2.5 mg total) by nebulization every 6 (six) hours as needed for wheezing or shortness of breath. 10/22/18   Elodia Florence., MD  albuterol (VENTOLIN HFA) 108 (90 Base) MCG/ACT inhaler Inhale 2 puffs into the lungs every 4 (four) hours as needed. 10/03/18   Richardo Priest, MD  aspirin-acetaminophen-caffeine (EXCEDRIN MIGRAINE) [phone] MG tablet Take 2 tablets by mouth every 6 (six) hours as needed for headache.  [provider]  budesonide-formoterol (SYMBICORT) 160-4.5 MCG/ACT inhaler Inhale 2 puffs into the lungs 2 (two) times daily as needed (wheezing/shortness of breath).  09/27/18   [provider]  calcium carbonate (CALCIUM 600) 600 MG TABS tablet Take 1 tablet by mouth daily.    [provider]  carvedilol (COREG) 6.25 MG tablet Take 1 tablet (6.25 mg total) by mouth daily. 10/17/18   Richardo Priest, MD  cholecalciferol (VITAMIN D) 1000 units tablet Take 1 tablet (1,000 Units total) by mouth daily. 08/26/15    Magrinat, Virgie Dad, MD  citalopram (CELEXA) 40 MG tablet Take 40 mg by mouth daily.    [provider]  hydrALAZINE (APRESOLINE) 25 MG tablet Take 1 tablet (25 mg total) by mouth 2 (two) times a day. 08/24/18 02/19/23  Richardo Priest, MD  HYDROcodone-acetaminophen (NORCO/VICODIN) 5-325 MG tablet Take 2 tablets by mouth every 4 (four) hours as needed. 02/20/19   Donna Apley, PA-C  nitroGLYCERIN (NITROSTAT) 0.4 MG SL tablet Place 1 tablet (0.4 mg total) under the tongue every 5 (five) minutes as needed for chest pain. 10/10/18 02/19/23  Richardo Priest, MD  OXYGEN Inhale 3 L into the lungs continuous.    [provider]  pantoprazole (PROTONIX) 40 MG tablet Take 40 mg by mouth daily. Reported on 07/03/2015    [provider]  potassium chloride SA (KLOR-CON M20) 20 MEQ tablet Take 2 tablets (40 mEq total) by mouth daily. (please follow up with your PCP within a few days for repeat labs) 10/22/18 02/18/25  Elodia Florence., MD  torsemide (DEMADEX) 20 MG tablet Take 1 tablet (20 mg total) by mouth daily. 10/22/18 02/18/22  Elodia Florence., MD  triamcinolone (NASACORT ALLERGY 24HR) 55 MCG/ACT AERO nasal inhaler Place 2 sprays into the nose daily as needed (congestion).    [provider]    Family History Family History  Problem Relation Age of Onset   Melanoma Mother    Skin cancer Father     Social History Social History   Tobacco Use   Smoking status: Never Smoker   Smokeless tobacco: Never Used  Substance Use Topics   Alcohol use: No   Drug use: No     Allergies   Ace inhibitors and Sulfa antibiotics   Review of Systems Review of Systems  Constitutional: Negative for chills and fever.  Musculoskeletal: Positive for arthralgias. Negative for back pain, gait problem, joint swelling, myalgias, neck pain and neck stiffness.  Skin: Negative for rash and wound.  Neurological: Negative for dizziness, tremors, seizures, syncope,  weakness, light-headedness, numbness and headaches.     Physical Exam Updated Vital Signs BP (!) 151/75    Pulse 65    Temp 98.3 F (36.8 C) (Oral)    Resp 16    SpO2 100%   Physical Exam Vitals signs and nursing note reviewed.  Constitutional:      Appearance: Normal appearance.  HENT:     Head: Normocephalic and atraumatic.  Eyes:     Conjunctiva/sclera: Conjunctivae normal.  Cardiovascular:     Rate and Rhythm: Normal rate and regular rhythm.  Pulmonary:     Effort: Pulmonary effort is normal.  Musculoskeletal:     Right shoulder: She exhibits decreased range of motion, tenderness, bony tenderness and pain. She exhibits no swelling, no effusion, no crepitus, normal pulse and normal strength.     Right elbow: Normal.    Right wrist: Normal.     Right  hand: Normal.     Comments: Very superficial bilateral knee abrasions.  No obvious deformities.  Otherwise normal knees.  Skin:    General: Skin is dry.     Capillary Refill: Capillary refill takes less than 2 seconds.     Findings: No petechiae, rash or wound.  Neurological:     General: No focal deficit present.     Mental Status: She is alert.  Psychiatric:        Mood and Affect: Mood normal.      ED Treatments / Results  Labs (all labs ordered are listed, but only abnormal results are displayed) Labs Reviewed - No data to display  EKG None  Radiology Dg Shoulder Right  Result Date: 02/20/2019 CLINICAL DATA:  Fall, right shoulder pain EXAM: RIGHT SHOULDER - 2+ VIEW COMPARISON:  None. FINDINGS: Acute fracture in the right humeral head and neck with nondisplaced greater tuberosity fracture component. Minimal lateral displacement of the lateral cortex of the humeral neck. No apparent involvement of the glenohumeral articular surface. Glenohumeral and acromioclavicular joints are aligned without dislocation. No additional fractures are identified. IMPRESSION: Acute, minimally displaced fracture of the right humeral  head and neck without dislocation. Electronically Signed   By: Donna Shaffer M.D.   On: 02/20/2019 12:41    Procedures Procedures (including critical care time)  Medications Ordered in ED Medications  HYDROcodone-acetaminophen (NORCO/VICODIN) 5-325 MG per tablet 1 tablet (1 tablet Oral Given 02/20/19 1308)     Initial Impression / Assessment and Plan / ED Course  I have reviewed the triage vital signs and the nursing notes.  Pertinent labs & imaging results that were available during my care of the patient were reviewed by me and considered in my medical decision making (see chart for details).  Clinical Course as of Feb 20 1324  Tue Feb 20, 4663  [id number] 73 year old female presenting after mechanical fall.  Work-up revealing a right humeral head and neck fracture.  She is neurovascularly intact.  No indication for CT head or neck at this time.  Patient given a dose of hydrocodone and orthopedic consult is pending   [KM]  1316 Spoke with Donna Shaffer orthopedic PA. He advised sling, pain control, no weight bearing on the shoulder and f/u Dr. Doran Shaffer next week. I discussed treatment options for the patients pain to include narcotic and non-narcotic medications. I discussed the risks of narcotic medications in full detail to include overdose and addiction. Patient verbalized full understanding of risks of narcotic medication but still insisted to take rx for narcotic pain medication due to the severity of their pain.  Prior to providing a prescription for a controlled substance, I independently reviewed the patient's recent prescription history on the [place]. The patient had no recent or regular prescriptions and was deemed appropriate for a brief, less than 3 day prescription of narcotic for acute analgesia.     [KM]    Clinical Course User Index [KM] Donna Apley, PA-C       Based on review of vitals, medical screening exam, lab  work and/or imaging, there does not appear to be an acute, emergent etiology for the patient's symptoms. Counseled pt on good return precautions and encouraged both PCP and ED follow-up as needed.  Prior to discharge, I also discussed incidental imaging findings with patient in detail and advised appropriate, recommended follow-up in detail.  Clinical Impression: 1. Closed displaced fracture of surgical neck of right humerus, unspecified fracture morphology,  initial encounter     Disposition: Discharge  Prior to providing a prescription for a controlled substance, I independently reviewed the patient's recent prescription history on the [facility]. The patient had no recent or regular prescriptions and was deemed appropriate for a brief, less than 3 day prescription of narcotic for acute analgesia.  This note was prepared with assistance of Systems analyst. Occasional wrong-word or sound-a-like substitutions may have occurred due to the inherent limitations of voice recognition software.   Final Clinical Impressions(s) / ED Diagnoses   Final diagnoses:  Closed displaced fracture of surgical neck of right humerus, unspecified fracture morphology, initial encounter    ED Discharge Orders         Ordered    HYDROcodone-acetaminophen (NORCO/VICODIN) 5-325 MG tablet  Every 4 hours PRN     02/20/19 1325           Donna Shaffer 02/20/19 1325    Davonna Belling, MD 02/20/19 1501

## 2019-02-20 NOTE — Progress Notes (Signed)
Orthopedic Tech Progress Note Patient Details:  Elzada Pytel Jun 27, 1945 [id number]  Ortho Devices Type of Ortho Device: Sling immobilizer Ortho Device/Splint Location: RUE Ortho Device/Splint Interventions: Ordered, Application   Post Interventions Patient Tolerated: Well Instructions Provided: Care of device   Braulio Bosch 02/20/2019, 2:05 PM

## 2019-02-20 NOTE — Discharge Instructions (Signed)
You are seen today for fall.  It looks like you have fractured the head and neck of your right humerus which is the bone in your shoulder.  We spoke to the orthopedic specialist who would like [place] to put you in a sling and give you prescription pain medication.  Dr. Doran Durand is the orthopedic specialist who expects you to give them a phone call in the next day to schedule a follow-up appointment next week.  Please remain in the sling for 24 hours out of the day.  Be careful with the pain medication as it may make you sleepy or drowsy.  All narcotic pain medications have the risk of addiction and overdose even if taken as prescribed.  Take the medication for severe pain only.  You may also additionally take Motrin or Aleve along with the pain medication.

## 2019-02-20 NOTE — ED Notes (Signed)
Ortho placing pt in sling

## 2019-02-21 ENCOUNTER — Telehealth: Payer: Self-pay | Admitting: Cardiology

## 2019-02-21 NOTE — Telephone Encounter (Signed)
Wants lab results

## 2019-02-21 NOTE — Telephone Encounter (Signed)
Patient's daughter [name] notified of normal lab results with no changes in treatment per Dr Bettina Gavia.

## 2019-02-27 ENCOUNTER — Other Ambulatory Visit: Payer: Self-pay | Admitting: *Deleted

## 2019-02-27 MED ORDER — HYDRALAZINE HCL 25 MG PO TABS: 25.0000 mg | ORAL_TABLET | Freq: Two times a day (BID) | ORAL | 0 refills | Status: DC

## 2019-05-07 ENCOUNTER — Other Ambulatory Visit: Payer: Self-pay | Admitting: Cardiology

## 2019-05-18 ENCOUNTER — Other Ambulatory Visit: Payer: Self-pay

## 2019-05-18 ENCOUNTER — Ambulatory Visit (INDEPENDENT_AMBULATORY_CARE_PROVIDER_SITE_OTHER): Payer: Medicare Other | Admitting: Cardiology

## 2019-05-18 ENCOUNTER — Encounter: Payer: Self-pay | Admitting: Cardiology

## 2019-05-18 VITALS — BP 146/60 | HR 80 | Temp 97.8°F | Ht 62.0 in | Wt 264.0 lb

## 2019-05-18 DIAGNOSIS — J9611 Chronic respiratory failure with hypoxia: Secondary | ICD-10-CM

## 2019-05-18 DIAGNOSIS — I5032 Chronic diastolic (congestive) heart failure: Secondary | ICD-10-CM | POA: Diagnosis not present

## 2019-05-18 DIAGNOSIS — I130 Hypertensive heart and chronic kidney disease with heart failure and stage 1 through stage 4 chronic kidney disease, or unspecified chronic kidney disease: Secondary | ICD-10-CM

## 2019-05-18 DIAGNOSIS — J9612 Chronic respiratory failure with hypercapnia: Secondary | ICD-10-CM

## 2019-05-18 DIAGNOSIS — E876 Hypokalemia: Secondary | ICD-10-CM

## 2019-05-18 MED ORDER — NITROGLYCERIN 0.4 MG SL SUBL: 0.4000 mg | SUBLINGUAL_TABLET | SUBLINGUAL | 3 refills | Status: DC | PRN

## 2019-05-18 MED ORDER — TORSEMIDE 20 MG PO TABS: 20.0000 mg | ORAL_TABLET | Freq: Every day | ORAL | 1 refills | Status: DC

## 2019-05-18 MED ORDER — CARVEDILOL 6.25 MG PO TABS: ORAL_TABLET | ORAL | 1 refills | Status: DC

## 2019-05-18 MED ORDER — HYDRALAZINE HCL 25 MG PO TABS: 25.0000 mg | ORAL_TABLET | Freq: Two times a day (BID) | ORAL | 1 refills | Status: DC

## 2019-05-18 NOTE — Patient Instructions (Signed)
Medication Instructions:  Your physician recommends that you continue on your current medications as directed. Please refer to the Current Medication list given to you today.  *If you need a refill on your cardiac medications before your next appointment, please call your pharmacy*   Lab Work: Your physician recommends that you return for lab work in: [place] If you have labs (blood work) drawn today and your tests are completely normal, you will receive your results only by: Marland Kitchen MyChart Message (if you have MyChart) OR . A paper copy in the mail If you have any lab test that is abnormal or we need to change your treatment, we will call you to review the results.   Testing/Procedures: None  Follow-Up: At [facility], you and your health needs are our priority.  As part of our continuing mission to provide you with exceptional heart care, we have created designated Provider Care Teams.  These Care Teams include your primary Cardiologist (physician) and Advanced Practice Providers (APPs -  Physician Assistants and Nurse Practitioners) who all work together to provide you with the care you need, when you need it.  We recommend signing up for the patient portal called "MyChart".  Sign up information is provided on this After Visit Summary.  MyChart is used to connect with patients for Virtual Visits (Telemedicine).  Patients are able to view lab/test results, encounter notes, upcoming appointments, etc.  Non-urgent messages can be sent to your provider as well.   To learn more about what you can do with MyChart, go to [email or web address].    Your next appointment:   3 month(s)  The format for your next appointment:   In Person  Provider:   Shirlee More, MD   Other Instructions

## 2019-05-18 NOTE — Progress Notes (Signed)
Cardiology Office Note:    Date:  05/18/2019   ID:  Donna Shaffer, Donna Shaffer May 04, 1945, MRN [id number]  PCP:  Myrlene Broker, MD  Cardiologist:  Donna More, MD    Referring MD: Myrlene Broker, MD    ASSESSMENT:    1. Chronic diastolic heart failure ([place])   2. Hypertensive heart and chronic kidney disease with heart failure and stage 1 through stage 4 chronic kidney disease, or chronic kidney disease ([place])   3. Hypokalemia   4. Chronic respiratory failure with hypoxia and hypercapnia (HCC)   5. Obesity, morbid, BMI 40.0-49.9 ([place])    PLAN:    In order of problems listed above:  1. Improved stable no volume overload continue her current diuretic 2. Her blood pressure is at target continue treatment diuretic beta-blocker hydralazine recheck renal function 3. At risk for she has had previous severe hypokalemia recheck her electrolytes including potassium magnesium 4. Continue with current nasal oxygen she requires a sleep study at some point 5. Encouraged to continue weight loss   Next appointment: 6 months   Medication Adjustments/Labs and Tests Ordered: Current medicines are reviewed at length with the patient today.  Concerns regarding medicines are outlined above.  Orders Placed This Encounter  Procedures  . Basic Metabolic Panel (BMET)  . Magnesium   Meds ordered this encounter  Medications  . carvedilol (COREG) 6.25 MG tablet    Sig: TAKE 1 TABLET(6.25 MG) BY MOUTH DAILY    Dispense:  90 tablet    Refill:  1  . hydrALAZINE (APRESOLINE) 25 MG tablet    Sig: Take 1 tablet (25 mg total) by mouth 2 (two) times daily.    Dispense:  180 tablet    Refill:  1  . nitroGLYCERIN (NITROSTAT) 0.4 MG SL tablet    Sig: Place 1 tablet (0.4 mg total) under the tongue every 5 (five) minutes as needed for chest pain.    Dispense:  90 tablet    Refill:  3  . torsemide (DEMADEX) 20 MG tablet    Sig: Take 1 tablet (20 mg total) by mouth daily.    Dispense:  90 tablet   Refill:  1    Take 1 tablet daily, can take a second tablet in the afternoon as needed    Chief Complaint  Patient presents with  . Follow-up    Respiratory failure chronic  . Congestive Heart Failure  . Hypertension  . Chronic Kidney Disease    History of Present Illness:    Donna Shaffer is a 74 y.o. female with a hx of diastolic heart failure, hypertension, Type 2 DM  and chest pain with a normal myocardial perfusion study at [facility] 09/19/2018. She was subsequently admitted to [facility] with severe refractory hypokalemia and chronic respiratory failure on ambulatory oxygen 3 L/min.  He was advised PFTs and a sleep study at discharge from the hospital in August that appeared to be performed.Marland Kitchen   She was last seen 02/19/2019.  Her potassium and magnesium were normal at that time. On 02/20/2019 she had a fall and fracture of the right humerus head.  Compliance with diet, lifestyle and medications: Yes  She is doing better no edema shortness of breath is stable and comfortable on nasal oxygen no palpitation or syncope.  She is recovering from a fracture of the right upper extremity. Past Medical History:  Diagnosis Date  . Anxiety   . Breast cancer ([place])    left  .  Cancer ([place])   . Colon polyps   . Depression   . Hypertension   . Personal history of radiation therapy   . Pneumonia   . Radiation 04/09/15-05/23/15   left breast 50.4 gray, lumpectomy cavity boost 10 gray  . Type 2 diabetes mellitus with hyperglycemia, without long-term current use of insulin ([place]) 11/24/2016    Past Surgical History:  Procedure Laterality Date  . ABDOMINAL HYSTERECTOMY    . APPENDECTOMY    . BILATERAL OOPHORECTOMY    . BREAST LUMPECTOMY Left 02/2015  . CATARACT EXTRACTION Bilateral   . CHOLECYSTECTOMY    . RADIOACTIVE SEED GUIDED PARTIAL MASTECTOMY WITH AXILLARY SENTINEL LYMPH NODE BIOPSY Left 02/28/2015   Procedure: RADIOACTIVE SEED GUIDED PARTIAL MASTECTOMY WITH  AXILLARY SENTINEL LYMPH NODE BIOPSY;  Surgeon: Autumn Messing III, MD;  Location: [place];  Service: General;  Laterality: Left;    Current Medications: Current Meds  Medication Sig  . albuterol (PROVENTIL) (2.5 MG/3ML) 0.083% nebulizer solution Take 3 mLs (2.5 mg total) by nebulization every 6 (six) hours as needed for wheezing or shortness of breath.  Marland Kitchen albuterol (VENTOLIN HFA) 108 (90 Base) MCG/ACT inhaler Inhale 2 puffs into the lungs every 4 (four) hours as needed.  Marland Kitchen aspirin-acetaminophen-caffeine (EXCEDRIN MIGRAINE) 250-250-65 MG tablet Take 2 tablets by mouth every 6 (six) hours as needed for headache.  . calcium carbonate (CALCIUM 600) 600 MG TABS tablet Take 1 tablet by mouth daily.  . carvedilol (COREG) 6.25 MG tablet TAKE 1 TABLET(6.25 MG) BY MOUTH DAILY  . cholecalciferol (VITAMIN D) 1000 units tablet Take 1 tablet (1,000 Units total) by mouth daily.  . citalopram (CELEXA) 40 MG tablet Take 40 mg by mouth daily.  . famotidine (PEPCID) 40 MG tablet famotidine 40 mg tablet  TK 1 T PO QHS  . hydrALAZINE (APRESOLINE) 25 MG tablet Take 1 tablet (25 mg total) by mouth 2 (two) times daily.  . hydrOXYzine (ATARAX/VISTARIL) 25 MG tablet hydroxyzine HCl 25 mg tablet  TK 1 T PO QHS  . nitroGLYCERIN (NITROSTAT) 0.4 MG SL tablet Place 1 tablet (0.4 mg total) under the tongue every 5 (five) minutes as needed for chest pain.  . OXYGEN Inhale 3 L into the lungs continuous.  . pantoprazole (PROTONIX) 40 MG tablet Take 40 mg by mouth daily. Reported on 07/03/2015  . potassium chloride SA (KLOR-CON M20) 20 MEQ tablet Take 2 tablets (40 mEq total) by mouth daily. (please follow up with your PCP within a few days for repeat labs)  . torsemide (DEMADEX) 20 MG tablet Take 1 tablet (20 mg total) by mouth daily.  Marland Kitchen triamcinolone cream (KENALOG) 0.1 % triamcinolone acetonide 0.1 % topical cream  . [DISCONTINUED] carvedilol (COREG) 6.25 MG tablet TAKE 1 TABLET(6.25 MG) BY MOUTH DAILY  .  [DISCONTINUED] hydrALAZINE (APRESOLINE) 25 MG tablet Take 1 tablet (25 mg total) by mouth 2 (two) times daily.  . [DISCONTINUED] nitroGLYCERIN (NITROSTAT) 0.4 MG SL tablet Place 1 tablet (0.4 mg total) under the tongue every 5 (five) minutes as needed for chest pain.  . [DISCONTINUED] torsemide (DEMADEX) 20 MG tablet Take 1 tablet (20 mg total) by mouth daily.     Allergies:   Ace inhibitors, Iodinated diagnostic agents, and Sulfa antibiotics   Social History   Socioeconomic History  . Marital status: Single    Spouse name: Not on file  . Number of children: 2  . Years of education: Not on file  . Highest education level: Not on file  Occupational  History  . Occupation: Proofreader  Tobacco Use  . Smoking status: Never Smoker  . Smokeless tobacco: Never Used  Substance and Sexual Activity  . Alcohol use: No  . Drug use: No  . Sexual activity: Not Currently  Other Topics Concern  . Not on file  Social History Narrative  . Not on file   Social Determinants of Health   Financial Resource Strain:   . Difficulty of Paying Living Expenses: Not on file  Food Insecurity:   . Worried About Charity fundraiser in the Last Year: Not on file  . Ran Out of Food in the Last Year: Not on file  Transportation Needs:   . Lack of Transportation (Medical): Not on file  . Lack of Transportation (Non-Medical): Not on file  Physical Activity:   . Days of Exercise per Week: Not on file  . Minutes of Exercise per Session: Not on file  Stress:   . Feeling of Stress : Not on file  Social Connections:   . Frequency of Communication with Friends and Family: Not on file  . Frequency of Social Gatherings with Friends and Family: Not on file  . Attends Religious Services: Not on file  . Active Member of Clubs or Organizations: Not on file  . Attends Archivist Meetings: Not on file  . Marital Status: Not on file     Family History: The patient's family history includes  Melanoma in her mother; Skin cancer in her father. ROS:   Please see the history of present illness.    All other systems reviewed and are negative.  EKGs/Labs/Other Studies Reviewed:    The following studies were reviewed today:  Recent Labs: 10/20/2018: TSH 2.056 10/21/2018: B Natriuretic Peptide 97.6 10/22/2018: ALT 153; Hemoglobin 12.9; Platelets 167 02/19/2019: BUN 23; Creatinine, Ser 1.16; Magnesium 2.0; NT-Pro BNP 593; Potassium 4.3; Sodium 138  Recent Lipid Panel No results found for: CHOL, TRIG, HDL, CHOLHDL, VLDL, LDLCALC, LDLDIRECT  Physical Exam:    VS:  BP (!) 146/60   Pulse 80   Temp 97.8 F (36.6 C)   Ht 5\' 2"  (1.575 m)   Wt 264 lb (119.7 kg)   LMP  (LMP Unknown)   SpO2 97%   BMI 48.29 kg/m     Wt Readings from Last 3 Encounters:  05/18/19 264 lb (119.7 kg)  02/19/19 263 lb (119.3 kg)  11/14/18 281 lb (127.5 kg)     GEN: She is obese but she is lost a significant amount of weight well nourished, well developed in no acute distress HEENT: Normal NECK: No JVD; No carotid bruits LYMPHATICS: No lymphadenopathy CARDIAC: RRR, no murmurs, rubs, gallops RESPIRATORY:  Clear to auscultation without rales, wheezing or rhonchi  ABDOMEN: Soft, non-tender, non-distended MUSCULOSKELETAL:  No edema; No deformity  SKIN: Warm and dry NEUROLOGIC:  Alert and oriented x 3 PSYCHIATRIC:  Normal affect    Signed, Donna More, MD  05/18/2019 2:42 PM    [facility]

## 2019-05-19 LAB — BASIC METABOLIC PANEL
BUN/Creatinine Ratio: 22 (ref 12–28)
BUN: 23 mg/dL (ref 8–27)
CO2: 31 mmol/L — ABNORMAL HIGH (ref 20–29)
Calcium: 9.5 mg/dL (ref 8.7–10.3)
Chloride: 98 mmol/L (ref 96–106)
Creatinine, Ser: 1.06 mg/dL — ABNORMAL HIGH (ref 0.57–1.00)
GFR calc Af Amer: 61 mL/min/{1.73_m2} (ref >59)
GFR calc non Af Amer: 53 mL/min/{1.73_m2} — ABNORMAL LOW (ref >59)
Glucose: 124 mg/dL — ABNORMAL HIGH (ref 65–99)
Potassium: 3.6 mmol/L (ref 3.5–5.2)
Sodium: 142 mmol/L (ref 134–144)

## 2019-05-19 LAB — MAGNESIUM: Magnesium: 1.9 mg/dL (ref 1.6–2.3)

## 2019-05-22 ENCOUNTER — Telehealth: Payer: Self-pay | Admitting: Emergency Medicine

## 2019-05-22 NOTE — Telephone Encounter (Signed)
Patient is returning phone call.  °

## 2019-05-22 NOTE — Telephone Encounter (Signed)
Informed pt of results. Pt verbalized understanding. 

## 2019-05-22 NOTE — Telephone Encounter (Signed)
Left lab results on patient's voicemail per dpr, advised her to call with any questions.

## 2019-08-14 ENCOUNTER — Other Ambulatory Visit: Payer: Self-pay

## 2019-08-14 NOTE — Progress Notes (Deleted)
Cardiology Office Note:    Date:  08/14/2019   ID:  Donna Shaffer, Donna Shaffer 1945/12/31, MRN [id number]  PCP:  Myrlene Broker, MD  Cardiologist:  Shirlee More, MD    Referring MD: Myrlene Broker, MD    ASSESSMENT:    No diagnosis found. PLAN:    In order of problems listed above:  1. ***   Next appointment: ***   Medication Adjustments/Labs and Tests Ordered: Current medicines are reviewed at length with the patient today.  Concerns regarding medicines are outlined above.  No orders of the defined types were placed in this encounter.  No orders of the defined types were placed in this encounter.   No chief complaint on file.   History of Present Illness:    Donna Shaffer is a 74 y.o. female with a hx of  diastolic heart failure, hypertension, Type 2 DM  and chest pain with a normal myocardial perfusion study at [facility] 09/19/2018. She was subsequently admitted to [facility] with severe refractory hypokalemia and chronic respiratory failure on ambulatory oxygen 3 L/min.  She was last seen 04/27/2018.  Heart failure was compensated. Compliance with diet, lifestyle and medications: *** Past Medical History:  Diagnosis Date  . Abnormal chest x-ray 12/20/2017  . Acute renal failure ([place]) 09/29/2018  . Anxiety   . Breast cancer ([place])    left  . Breast cancer of upper-outer quadrant of left female breast ([place]) 03/11/2015  . Cancer ([place])   . Chest pain 12/14/2017  . Chronic diastolic heart failure ([place]) 10/03/2018  . Colon polyps   . Community acquired pneumonia of both lower lobes 03/18/2018  . Depression   . Dyspnea on exertion 12/14/2017  . Elevated liver enzymes   . Environmental and seasonal allergies 12/28/2017  . GERD without esophagitis 11/24/2016   Last Assessment & Plan:  Concern over throat awareness and difficulty swallowing. Several year history.  Symptoms seem to be worsening over the last several months.  She gets a lump in her  throat sensation.  Some difficulty swallowing.  Much throat clearing and sensation of drainage.  She is tried Zyrtec without improvement. EXAMINATION shows no intranasal polyps or purulence.  Oropharynx is unrem  . Hypertension   . Hypertensive disorder 10/19/2018  . Hypertensive heart and chronic kidney disease with heart failure and stage 1 through stage 4 chronic kidney disease, or chronic kidney disease ([place]) 12/14/2017  . 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/14/2017  . Hypokalemia 10/18/2018  . Malignant neoplasm of upper-outer quadrant of female breast ([place]) 03/11/2015  . Medicare annual wellness visit, subsequent 12/28/2017  . Mild persistent asthma without complication 1/75/1025  . Moderate persistent asthma with exacerbation 12/28/2017  . Morbid obesity ([place]) 08/26/2015  . Obesity, morbid, BMI 40.0-49.9 ([place]) 08/26/2015  . OSA (obstructive sleep apnea) 09/29/2018  . Peripheral edema 09/15/2018  . Personal history of radiation therapy   . Pneumonia   . Radiation 04/09/15-05/23/15   left breast 50.4 gray, lumpectomy cavity boost 10 gray  . Recurrent major depressive disorder, in full remission ([place]) 09/15/2018  . Referred otalgia of both ears 11/22/2017   Last Assessment & Plan:  Formatting of this note might be different from the original. Concern over recurring ear pain. Chronic intermittent history of ear discomfort.  Symptoms resolved without treatment.  No history of ear surgery, trauma or infection. EXAMINATION shows normal external canal and tympanic membranes bilaterally.  TMJ and cervical musculature are okay.  Oropharynx and indirect lary  .  SOB (shortness of breath) on exertion 12/14/2017  . Type 2 diabetes mellitus ([place]) 11/24/2016  . Type 2 diabetes mellitus with hyperglycemia, without long-term current use of insulin ([place]) 11/24/2016  . Type 2 diabetes mellitus with other specified complication ([place]) 09/23/9447  . Vitamin D deficiency 12/28/2017  . Wheezy bronchitis 03/20/2018    Past  Surgical History:  Procedure Laterality Date  . ABDOMINAL HYSTERECTOMY    . APPENDECTOMY    . BILATERAL OOPHORECTOMY    . BREAST LUMPECTOMY Left 02/2015  . CATARACT EXTRACTION Bilateral   . CHOLECYSTECTOMY    . RADIOACTIVE SEED GUIDED PARTIAL MASTECTOMY WITH AXILLARY SENTINEL LYMPH NODE BIOPSY Left 02/28/2015   Procedure: RADIOACTIVE SEED GUIDED PARTIAL MASTECTOMY WITH AXILLARY SENTINEL LYMPH NODE BIOPSY;  Surgeon: Autumn Messing III, MD;  Location: [place];  Service: General;  Laterality: Left;    Current Medications: No outpatient medications have been marked as taking for the 08/15/19 encounter (Appointment) with Richardo Priest, MD.     Allergies:   Other, Sulfa antibiotics, Ace inhibitors, Sulfasalazine, and Iodinated diagnostic agents   Social History   Socioeconomic History  . Marital status: Single    Spouse name: Not on file  . Number of children: 2  . Years of education: Not on file  . Highest education level: Not on file  Occupational History  . Occupation: Proofreader  Tobacco Use  . Smoking status: Never Smoker  . Smokeless tobacco: Never Used  Substance and Sexual Activity  . Alcohol use: No  . Drug use: No  . Sexual activity: Not Currently  Other Topics Concern  . Not on file  Social History Narrative  . Not on file   Social Determinants of Health   Financial Resource Strain:   . Difficulty of Paying Living Expenses:   Food Insecurity:   . Worried About Charity fundraiser in the Last Year:   . Arboriculturist in the Last Year:   Transportation Needs:   . Film/video editor (Medical):   Marland Kitchen Lack of Transportation (Non-Medical):   Physical Activity:   . Days of Exercise per Week:   . Minutes of Exercise per Session:   Stress:   . Feeling of Stress :   Social Connections:   . Frequency of Communication with Friends and Family:   . Frequency of Social Gatherings with Friends and Family:   . Attends Religious Services:   .  Active Member of Clubs or Organizations:   . Attends Archivist Meetings:   Marland Kitchen Marital Status:      Family History: The patient's ***family history includes Melanoma in her mother; Skin cancer in her father. ROS:   Please see the history of present illness.    All other systems reviewed and are negative.  EKGs/Labs/Other Studies Reviewed:    The following studies were reviewed today:  EKG:  EKG ordered today and personally reviewed.  The ekg ordered today demonstrates ***  Recent Labs: 10/20/2018: TSH 2.056 10/21/2018: B Natriuretic Peptide 97.6 10/22/2018: ALT 153; Hemoglobin 12.9; Platelets 167 02/19/2019: NT-Pro BNP 593 05/18/2019: BUN 23; Creatinine, Ser 1.06; Magnesium 1.9; Potassium 3.6; Sodium 142  Recent Lipid Panel No results found for: CHOL, TRIG, HDL, CHOLHDL, VLDL, LDLCALC, LDLDIRECT  Physical Exam:    VS:  LMP  (LMP Unknown)     Wt Readings from Last 3 Encounters:  05/18/19 264 lb (119.7 kg)  02/19/19 263 lb (119.3 kg)  11/14/18 281 lb (127.5 kg)  GEN: *** Well nourished, well developed in no acute distress HEENT: Normal NECK: No JVD; No carotid bruits LYMPHATICS: No lymphadenopathy CARDIAC: ***RRR, no murmurs, rubs, gallops RESPIRATORY:  Clear to auscultation without rales, wheezing or rhonchi  ABDOMEN: Soft, non-tender, non-distended MUSCULOSKELETAL:  No edema; No deformity  SKIN: Warm and dry NEUROLOGIC:  Alert and oriented x 3 PSYCHIATRIC:  Normal affect    Signed, Shirlee More, MD  08/14/2019 4:07 PM    [facility]

## 2019-08-15 ENCOUNTER — Ambulatory Visit: Payer: Medicare Other | Admitting: Cardiology

## 2019-10-01 NOTE — Progress Notes (Signed)
Cardiology Office Note:    Date:  10/02/2019   ID:  Donna, Shaffer 02-Jul-1945, MRN [id number]  PCP:  Myrlene Broker, MD  Cardiologist:  Shirlee More, MD    Referring MD: Myrlene Broker, MD    ASSESSMENT:    1. Chronic diastolic heart failure ([place])   2. Hypertensive heart and chronic kidney disease with heart failure and stage 1 through stage 4 chronic kidney disease, or chronic kidney disease ([place])   3. Chronic respiratory failure with hypoxia and hypercapnia (HCC)   4. Hypokalemia    PLAN:    In order of problems listed above:  1. Her heart failure volume overload is stable she has no edema takes a low-dose diuretic we will recheck renal function with her previous hypokalemia and strongly encouraged her to get back to weighing daily and checking her blood pressure and recording. 2. Stable hypertension we will recheck her blood pressure before leaving the office follow-up numbers at home and for now continue her current treatment.  Recheck renal function for GFR potassium 3. Strongly encouraged her to go ahead and get her sleep test done I suspect she has severe obstructive sleep apnea and needs CPAP or BiPAP 4. Recheck renal function 5. She tells me she has psoriasis complains bitterly of joint pain she had taken Humira in the past that was stopped because of heart failure ask for referral and I have sent her to Dr. Amil Amen rheumatology   Next appointment: 6 months   Medication Adjustments/Labs and Tests Ordered: Current medicines are reviewed at length with the patient today.  Concerns regarding medicines are outlined above.  Orders Placed This Encounter  Procedures  . EKG 12-Lead   No orders of the defined types were placed in this encounter.   No chief complaint on file.   History of Present Illness:    Donna Shaffer is a 73 y.o. female with a hx of diastolic heart failure, hypertension, Type 2 DM  and chest pain with a normal myocardial  perfusion study at [facility] 09/19/2018. She was subsequently [facility] to [facility] with severe refractory hypokalemia and chronic respiratory failure on ambulatory oxygen 3 L/min  She was last seen 05/18/2019. Compliance with diet, lifestyle and medications: Yes  Overall she is unchanged she does 6 months e obstructive sleep apnea.  Urged her to begin to weigh daily check her blood pressure and record she has no edema not having shortness of breath at home within the house ambulating no orthopnea chest pain palpitation or syncope.  She has had severe electrolyte disturbances we will recheck her renal function today Past Medical History:  Diagnosis Date  . Abnormal chest x-ray 12/20/2017  . Acute renal failure ([place]) 09/29/2018  . Anxiety   . Breast cancer ([place])    left  . Breast cancer of upper-outer quadrant of left female breast ([place]) 03/11/2015  . Cancer ([place])   . Chest pain 12/14/2017  . Chronic diastolic heart failure ([place]) 10/03/2018  . Colon polyps   . Community acquired pneumonia of both lower lobes 03/18/2018  . Depression   . Dyspnea on exertion 12/14/2017  . Elevated liver enzymes   . Environmental and seasonal allergies 12/28/2017  . GERD without esophagitis 11/24/2016   Last Assessment & Plan:  Concern over throat awareness and difficulty swallowing. Several year history.  Symptoms seem to be worsening over the last several months.  She gets a lump in her throat sensation.  Some difficulty swallowing.  Much throat clearing and sensation of drainage.  She is tried Zyrtec without improvement. EXAMINATION shows no intranasal polyps or purulence.  Oropharynx is unrem  . Hypertension   . Hypertensive disorder 10/19/2018  . Hypertensive heart and chronic kidney disease with heart failure and stage 1 through stage 4 chronic kidney disease, or chronic kidney disease ([place]) 12/14/2017  . 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/14/2017  . Hypokalemia 10/18/2018  . Malignant  neoplasm of upper-outer quadrant of female breast ([place]) 03/11/2015  . Medicare annual wellness visit, subsequent 12/28/2017  . Mild persistent asthma without complication 9/70/2637  . Moderate persistent asthma with exacerbation 12/28/2017  . Morbid obesity ([place]) 08/26/2015  . Obesity, morbid, BMI 40.0-49.9 ([place]) 08/26/2015  . OSA (obstructive sleep apnea) 09/29/2018  . Peripheral edema 09/15/2018  . Personal history of radiation therapy   . Pneumonia   . Radiation 04/09/15-05/23/15   left breast 50.4 gray, lumpectomy cavity boost 10 gray  . Recurrent major depressive disorder, in full remission ([place]) 09/15/2018  . Referred otalgia of both ears 11/22/2017   Last Assessment & Plan:  Formatting of this note might be different from the original. Concern over recurring ear pain. Chronic intermittent history of ear discomfort.  Symptoms resolved without treatment.  No history of ear surgery, trauma or infection. EXAMINATION shows normal external canal and tympanic membranes bilaterally.  TMJ and cervical musculature are okay.  Oropharynx and indirect lary  . SOB (shortness of breath) on exertion 12/14/2017  . Type 2 diabetes mellitus ([place]) 11/24/2016  . Type 2 diabetes mellitus with hyperglycemia, without long-term current use of insulin ([place]) 11/24/2016  . Type 2 diabetes mellitus with other specified complication ([place]) 10/24/8848  . Vitamin D deficiency 12/28/2017  . Wheezy bronchitis 03/20/2018    Past Surgical History:  Procedure Laterality Date  . ABDOMINAL HYSTERECTOMY    . APPENDECTOMY    . BILATERAL OOPHORECTOMY    . BREAST LUMPECTOMY Left 02/2015  . CATARACT EXTRACTION Bilateral   . CHOLECYSTECTOMY    . RADIOACTIVE SEED GUIDED PARTIAL MASTECTOMY WITH AXILLARY SENTINEL LYMPH NODE BIOPSY Left 02/28/2015   Procedure: RADIOACTIVE SEED GUIDED PARTIAL MASTECTOMY WITH AXILLARY SENTINEL LYMPH NODE BIOPSY;  Surgeon: Autumn Messing III, MD;  Location: [place];  Service: General;  Laterality: Left;     Current Medications: Current Meds  Medication Sig  . albuterol (PROVENTIL) (2.5 MG/3ML) 0.083% nebulizer solution Take 3 mLs (2.5 mg total) by nebulization every 6 (six) hours as needed for wheezing or shortness of breath.  Marland Kitchen albuterol (VENTOLIN HFA) 108 (90 Base) MCG/ACT inhaler Inhale 2 puffs into the lungs every 4 (four) hours as needed.  Marland Kitchen aspirin-acetaminophen-caffeine (EXCEDRIN MIGRAINE) 250-250-65 MG tablet Take 1 tablet by mouth every 8 (eight) hours as needed.  . calcium carbonate (CALCIUM 600) 600 MG TABS tablet Take 1 tablet by mouth daily.  . carvedilol (COREG) 6.25 MG tablet TAKE 1 TABLET(6.25 MG) BY MOUTH DAILY  . cholecalciferol (VITAMIN D) 1000 units tablet Take 1 tablet (1,000 Units total) by mouth daily.  . citalopram (CELEXA) 40 MG tablet Take 40 mg by mouth daily.  . hydrALAZINE (APRESOLINE) 25 MG tablet Take 1 tablet (25 mg total) by mouth 2 (two) times daily.  . hydrOXYzine (ATARAX/VISTARIL) 25 MG tablet Take 25 mg by mouth 3 (three) times daily as needed.  . nitroGLYCERIN (NITROSTAT) 0.4 MG SL tablet Place 1 tablet (0.4 mg total) under the tongue every 5 (five) minutes as needed for chest pain.  . OXYGEN Inhale 3  L into the lungs continuous.  . pantoprazole (PROTONIX) 40 MG tablet Take 40 mg by mouth daily. Reported on 07/03/2015  . potassium chloride SA (KLOR-CON M20) 20 MEQ tablet Take 2 tablets (40 mEq total) by mouth daily. (please follow up with your PCP within a few days for repeat labs)  . torsemide (DEMADEX) 20 MG tablet Take 1 tablet (20 mg total) by mouth daily.  . [DISCONTINUED] famotidine (PEPCID) 40 MG tablet famotidine 40 mg tablet  TK 1 T PO QHS     Allergies:   Other, Sulfa antibiotics, Ace inhibitors, Sulfasalazine, and Iodinated diagnostic agents   Social History   Socioeconomic History  . Marital status: Single    Spouse name: Not on file  . Number of children: 2  . Years of education: Not on file  . Highest education level: Not on file   Occupational History  . Occupation: Proofreader  Tobacco Use  . Smoking status: Never Smoker  . Smokeless tobacco: Never Used  Vaping Use  . Vaping Use: Never used  Substance and Sexual Activity  . Alcohol use: No  . Drug use: No  . Sexual activity: Not Currently  Other Topics Concern  . Not on file  Social History Narrative  . Not on file   Social Determinants of Health   Financial Resource Strain:   . Difficulty of Paying Living Expenses:   Food Insecurity:   . Worried About Charity fundraiser in the Last Year:   . Arboriculturist in the Last Year:   Transportation Needs:   . Film/video editor (Medical):   Marland Kitchen Lack of Transportation (Non-Medical):   Physical Activity:   . Days of Exercise per Week:   . Minutes of Exercise per Session:   Stress:   . Feeling of Stress :   Social Connections:   . Frequency of Communication with Friends and Family:   . Frequency of Social Gatherings with Friends and Family:   . Attends Religious Services:   . Active Member of Clubs or Organizations:   . Attends Archivist Meetings:   Marland Kitchen Marital Status:      Family History: The patient's family history includes Melanoma in her mother; Skin cancer in her father. ROS:   Please see the history of present illness.    All other systems reviewed and are negative.  EKGs/Labs/Other Studies Reviewed:    The following studies were reviewed today:  EKG:  EKG ordered today and personally reviewed.  The ekg ordered today demonstrates sinus rhythm normal EKG  Recent Labs: 10/20/2018: TSH 2.056 10/21/2018: B Natriuretic Peptide 97.6 10/22/2018: ALT 153; Hemoglobin 12.9; Platelets 167 02/19/2019: NT-Pro BNP 593 05/18/2019: BUN 23; Creatinine, Ser 1.06; Magnesium 1.9; Potassium 3.6; Sodium 142  Recent Lipid Panel No results found for: CHOL, TRIG, HDL, CHOLHDL, VLDL, LDLCALC, LDLDIRECT  Physical Exam:    VS:  BP (!) 156/78 (BP Location: Right Arm, Patient Position: Sitting,  Cuff Size: Large)   Pulse 73   Ht 5\' 2"  (1.575 m)   Wt 261 lb 6.4 oz (118.6 kg)   LMP  (LMP Unknown)   SpO2 92%   BMI 47.81 kg/m     Wt Readings from Last 3 Encounters:  10/02/19 261 lb 6.4 oz (118.6 kg)  05/18/19 264 lb (119.7 kg)  02/19/19 263 lb (119.3 kg)     GEN: Obese sleep apnea appearance well nourished, well developed in no acute distress HEENT: Normal NECK: No JVD; No carotid  bruits LYMPHATICS: No lymphadenopathy CARDIAC: Distant heart sounds RRR, no murmurs, rubs, gallops RESPIRATORY:  Clear to auscultation without rales, wheezing or rhonchi  ABDOMEN: Soft, non-tender, non-distended MUSCULOSKELETAL:  No edema; No deformity  SKIN: Warm and dry NEUROLOGIC:  Alert and oriented x 3 PSYCHIATRIC:  Normal affect    Signed, Shirlee More, MD  10/02/2019 10:00 AM     [facility]

## 2019-10-02 ENCOUNTER — Other Ambulatory Visit: Payer: Self-pay

## 2019-10-02 ENCOUNTER — Ambulatory Visit: Payer: Medicare Other | Admitting: Cardiology

## 2019-10-02 ENCOUNTER — Encounter: Payer: Self-pay | Admitting: Cardiology

## 2019-10-02 VITALS — BP 156/78 | HR 73 | Ht 62.0 in | Wt 261.4 lb

## 2019-10-02 DIAGNOSIS — I5032 Chronic diastolic (congestive) heart failure: Secondary | ICD-10-CM | POA: Diagnosis not present

## 2019-10-02 DIAGNOSIS — L409 Psoriasis, unspecified: Secondary | ICD-10-CM

## 2019-10-02 DIAGNOSIS — R0602 Shortness of breath: Secondary | ICD-10-CM

## 2019-10-02 DIAGNOSIS — J9611 Chronic respiratory failure with hypoxia: Secondary | ICD-10-CM

## 2019-10-02 DIAGNOSIS — E876 Hypokalemia: Secondary | ICD-10-CM

## 2019-10-02 DIAGNOSIS — I130 Hypertensive heart and chronic kidney disease with heart failure and stage 1 through stage 4 chronic kidney disease, or unspecified chronic kidney disease: Secondary | ICD-10-CM | POA: Diagnosis not present

## 2019-10-02 DIAGNOSIS — G4730 Sleep apnea, unspecified: Secondary | ICD-10-CM

## 2019-10-02 DIAGNOSIS — J9612 Chronic respiratory failure with hypercapnia: Secondary | ICD-10-CM

## 2019-10-02 LAB — BASIC METABOLIC PANEL
BUN/Creatinine Ratio: 21 (ref 12–28)
BUN: 23 mg/dL (ref 8–27)
CO2: 28 mmol/L (ref 20–29)
Calcium: 9.6 mg/dL (ref 8.7–10.3)
Chloride: 95 mmol/L — ABNORMAL LOW (ref 96–106)
Creatinine, Ser: 1.1 mg/dL — ABNORMAL HIGH (ref 0.57–1.00)
GFR calc Af Amer: 58 mL/min/{1.73_m2} — ABNORMAL LOW (ref >59)
GFR calc non Af Amer: 50 mL/min/{1.73_m2} — ABNORMAL LOW (ref >59)
Glucose: 120 mg/dL — ABNORMAL HIGH (ref 65–99)
Potassium: 4 mmol/L (ref 3.5–5.2)
Sodium: 137 mmol/L (ref 134–144)

## 2019-10-02 LAB — PRO B NATRIURETIC PEPTIDE: NT-Pro BNP: 317 pg/mL — ABNORMAL HIGH (ref 0–301)

## 2019-10-02 NOTE — Patient Instructions (Signed)
Medication Instructions:  Your physician recommends that you continue on your current medications as directed. Please refer to the Current Medication list given to you today.  *If you need a refill on your cardiac medications before your next appointment, please call your pharmacy*   Lab Work: Your physician recommends that you return for lab work in: TODAY BMP, ProBNP If you have labs (blood work) drawn today and your tests are completely normal, you will receive your results only by: Marland Kitchen MyChart Message (if you have MyChart) OR . A paper copy in the mail If you have any lab test that is abnormal or we need to change your treatment, we will call you to review the results.   Testing/Procedures: We have put in the referral for you to have a sleep study done. They will call you to schedule this appointment.   We have also put in a referral for you to see Dr. Amil Amen, the rheumatologist in [place].    Follow-Up: At [facility], you and your health needs are our priority.  As part of our continuing mission to provide you with exceptional heart care, we have created designated Provider Care Teams.  These Care Teams include your primary Cardiologist (physician) and Advanced Practice Providers (APPs -  Physician Assistants and Nurse Practitioners) who all work together to provide you with the care you need, when you need it.  We recommend signing up for the patient portal called "MyChart".  Sign up information is provided on this After Visit Summary.  MyChart is used to connect with patients for Virtual Visits (Telemedicine).  Patients are able to view lab/test results, encounter notes, upcoming appointments, etc.  Non-urgent messages can be sent to your provider as well.   To learn more about what you can do with MyChart, go to [email or web address].    Your next appointment:   3 month(s)  The format for your next appointment:   In Person  Provider:   Shirlee More, MD   Other  Instructions Please take your weight and blood pressure daily while at home and record these for [place].

## 2019-10-05 ENCOUNTER — Telehealth: Payer: Self-pay | Admitting: *Deleted

## 2019-10-11 ENCOUNTER — Telehealth: Payer: Self-pay | Admitting: *Deleted

## 2019-10-11 DIAGNOSIS — G4730 Sleep apnea, unspecified: Secondary | ICD-10-CM

## 2019-10-11 NOTE — Telephone Encounter (Signed)
Staff message sent to Gae Bon ok to schedule sleep study. Per [facility] web portal no PA is required. Decision IB:[id number].

## 2019-10-11 NOTE — Telephone Encounter (Signed)
-----   Message from Lauralee Evener, [place] sent at 10/05/2019  8:47 AM EDT ----- Regarding: RE: precert NO PA required.ok to schedule sleep study. Decision UP:[id number]. ----- Message ----- From: Freada Bergeron, CMA Sent: 10/02/2019   1:37 PM EDT To: [facility] Sleep Studies Subject: precert                                         ----- Message ----- From: Gita Kudo, RN Sent: 10/02/2019  10:07 AM EDT To: Freada Bergeron, CMA  Please schedule patient for a split night study per Dr. Darel Hong, RN

## 2019-10-12 NOTE — Telephone Encounter (Signed)
Patient is scheduled for lab study on 11/12/19. Patient understands her sleep study will be done at [facility] sleep lab. Patient understands she will receive a sleep packet in a week or so. Patient understands to call if she does not receive the sleep packet in a timely manner. Patient agrees with treatment and thanked me for call.

## 2019-10-24 ENCOUNTER — Telehealth: Payer: Self-pay | Admitting: Cardiology

## 2019-10-24 NOTE — Telephone Encounter (Signed)
Patient is calling to follow up in regards to status of a form, brought into the office during appointment on 10/02/19, that was signed and returned. She states [facility]) is requesting additional information. [facility] is requesting written documentation, to be signed by Dr. Bettina Gavia on letter head, verifying the patient's condition (CHF) and that she is requires oxygen 24/7. She states documentation may be faxed to [facility] at [phone] (attn: Ravonda H.).

## 2019-10-24 NOTE — Telephone Encounter (Signed)
Is there something I can do to take care of this for you?

## 2019-11-12 ENCOUNTER — Other Ambulatory Visit: Payer: Self-pay

## 2019-11-12 ENCOUNTER — Ambulatory Visit (HOSPITAL_BASED_OUTPATIENT_CLINIC_OR_DEPARTMENT_OTHER): Payer: Medicare Other | Attending: Cardiology | Admitting: Cardiology

## 2019-11-12 DIAGNOSIS — I5032 Chronic diastolic (congestive) heart failure: Secondary | ICD-10-CM | POA: Diagnosis not present

## 2019-11-12 DIAGNOSIS — G4730 Sleep apnea, unspecified: Secondary | ICD-10-CM

## 2019-11-15 NOTE — Procedures (Signed)
   Patient Name: Donna, Shaffer Date: 11/12/2019 Gender: Female D.O.B: July 03, 1945 Age (years): 67 Referring Provider: Shirlee More Height (inches): 5 Interpreting Physician: Fransico Him MD, ABSM Weight (lbs): 260 RPSGT: Carolin Coy BMI: 43 MRN: [id number] Neck Size: 17.00  CLINICAL INFORMATION Sleep Study Type: NPSG  Indication for sleep study: Diabetes, Fatigue, Hypertension, Obesity  Epworth Sleepiness Score: 10  SLEEP STUDY TECHNIQUE As per the AASM Manual for the Scoring of Sleep and Associated Events v2.3 (April 2016) with a hypopnea requiring 4% desaturations.  The channels recorded and monitored were frontal, central and occipital EEG, electrooculogram (EOG), submentalis EMG (chin), nasal and oral airflow, thoracic and abdominal wall motion, anterior tibialis EMG, snore microphone, electrocardiogram, and pulse oximetry.  MEDICATIONS Medications self-administered by patient taken the night of the study : N/A  SLEEP ARCHITECTURE The study was initiated at 10:54:41 PM and ended at 4:53:31 AM.  Sleep onset time was 103.2 minutes and the sleep efficiency was 5.3%. The total sleep time was 19 minutes.  Stage REM latency was N/A minutes.  The patient spent 55.3% of the night in stage N1 sleep, 44.7% in stage N2 sleep, 0.0% in stage N3 and 0% in REM.  Alpha intrusion was absent.  Supine sleep was 0.00%.  RESPIRATORY PARAMETERS The overall apnea/hypopnea index (AHI) was 3.2 per hour. There were 1 total apneas, including 1 obstructive, 0 central and 0 mixed apneas. There were 0 hypopneas and 23 RERAs.  The AHI during Stage REM sleep was N/A per hour.  AHI while supine was N/A per hour.  The mean oxygen saturation was 98.0%. The minimum SpO2 during sleep was 97.0%.  moderate snoring was noted during this study.  CARDIAC DATA The 2 lead EKG demonstrated sinus rhythm. The mean heart rate was 71.9 beats per minute. Other EKG findings include: PVCs.  LEG  MOVEMENT DATA The total PLMS were 0 with a resulting PLMS index of 0.0. Associated arousal with leg movement index was 6.3 .  IMPRESSIONS - No significant obstructive sleep apnea occurred during this study (AHI = 3.2/h). - No significant central sleep apnea occurred during this study (CAI = 0.0/h). - The patient had minimal or no oxygen desaturation during the study (Min O2 = 97.0%) - The patient snored with moderate snoring volume. - EKG findings include PVCs. - Clinically significant periodic limb movements did not occur during sleep. Associated arousals were significant.  DIAGNOSIS - Nondiagnostic Study  RECOMMENDATIONS - Recommend home sleep study as patient had very poor sleep at sleep lab. - Avoid alcohol, sedatives and other CNS depressants that may worsen sleep apnea and disrupt normal sleep architecture. - Sleep hygiene should be reviewed to assess factors that may improve sleep quality. - Weight management and regular exercise should be initiated or continued if appropriate.  [Electronically signed] 11/15/2019 07:59 PM  Fransico Him MD, ABSM Diplomate, American Board of Sleep Medicine

## 2019-11-20 ENCOUNTER — Telehealth: Payer: Self-pay

## 2019-11-20 ENCOUNTER — Other Ambulatory Visit: Payer: Self-pay

## 2019-11-20 ENCOUNTER — Telehealth: Payer: Self-pay | Admitting: *Deleted

## 2019-11-20 DIAGNOSIS — G4733 Obstructive sleep apnea (adult) (pediatric): Secondary | ICD-10-CM

## 2019-11-20 NOTE — Telephone Encounter (Signed)
Staff message sent to Resa Miner ok to schedule HST. No PA is required.

## 2019-11-20 NOTE — Progress Notes (Signed)
Home sleep study test order placed

## 2019-11-20 NOTE — Telephone Encounter (Signed)
Left message on patients voicemail asking that she please call us back letting her know that the home sleep study has been scheduled on October 28th at 9 AM.

## 2019-11-23 ENCOUNTER — Telehealth: Payer: Self-pay | Admitting: *Deleted

## 2019-11-23 NOTE — Telephone Encounter (Signed)
-----   Message from Sueanne Margarita, MD sent at 11/15/2019  8:02 PM EDT ----- Please let patient know that she did not have enough sleep time for diagnostic study.  Please order a home sleep study

## 2019-11-23 NOTE — Telephone Encounter (Signed)
Staff message sent to Donna Shaffer ok to schedule HST. [employer] does not require a PA.

## 2019-11-23 NOTE — Telephone Encounter (Signed)
Informed patient of sleep study results and patient understanding was verbalized. Patient understands her sleep study showed that she did not have enough sleep time for diagnostic study. Please order a home sleep study. Pt is aware and agreeable to her results. Home sleep sent to sleep pool.

## 2019-11-27 NOTE — Telephone Encounter (Signed)
Patient is aware and agreeable to Home Sleep Study through [facility]. Patient is scheduled for 01/17/20 at 9 AM to pick up home sleep kit and meet with Respiratory therapist at [facility]. Patient is aware that if this appointment date and time does not work for them they should contact Artis Delay directly at [phone]. Patient is aware that a sleep packet will be sent from [facility] in week.  Scheduled with Lilia Pro at [facility]

## 2019-12-17 ENCOUNTER — Other Ambulatory Visit: Payer: Self-pay | Admitting: Cardiology

## 2019-12-17 DIAGNOSIS — J9612 Chronic respiratory failure with hypercapnia: Secondary | ICD-10-CM

## 2019-12-17 DIAGNOSIS — J9611 Chronic respiratory failure with hypoxia: Secondary | ICD-10-CM

## 2019-12-17 DIAGNOSIS — E876 Hypokalemia: Secondary | ICD-10-CM

## 2019-12-17 DIAGNOSIS — I5032 Chronic diastolic (congestive) heart failure: Secondary | ICD-10-CM

## 2019-12-17 DIAGNOSIS — I130 Hypertensive heart and chronic kidney disease with heart failure and stage 1 through stage 4 chronic kidney disease, or unspecified chronic kidney disease: Secondary | ICD-10-CM

## 2020-01-01 DIAGNOSIS — I10 Essential (primary) hypertension: Secondary | ICD-10-CM | POA: Insufficient documentation

## 2020-01-01 DIAGNOSIS — K635 Polyp of colon: Secondary | ICD-10-CM | POA: Insufficient documentation

## 2020-01-01 DIAGNOSIS — C801 Malignant (primary) neoplasm, unspecified: Secondary | ICD-10-CM | POA: Insufficient documentation

## 2020-01-01 DIAGNOSIS — C50919 Malignant neoplasm of unspecified site of unspecified female breast: Secondary | ICD-10-CM | POA: Insufficient documentation

## 2020-01-01 DIAGNOSIS — Z923 Personal history of irradiation: Secondary | ICD-10-CM | POA: Insufficient documentation

## 2020-01-01 DIAGNOSIS — F32A Depression, unspecified: Secondary | ICD-10-CM | POA: Insufficient documentation

## 2020-01-01 DIAGNOSIS — J189 Pneumonia, unspecified organism: Secondary | ICD-10-CM | POA: Insufficient documentation

## 2020-01-01 DIAGNOSIS — F419 Anxiety disorder, unspecified: Secondary | ICD-10-CM | POA: Insufficient documentation

## 2020-01-01 NOTE — Progress Notes (Signed)
Cardiology Office Note:    Date:  01/02/2020   ID:  Alean, Kromer 1945-11-20, MRN [id number]  PCP:  Myrlene Broker, MD  Cardiologist:  Shirlee More, MD    Referring MD: Myrlene Broker, MD    ASSESSMENT:    1. 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 Chronic respiratory failure with hypoxia and hypercapnia (HCC)   3. Hypokalemia    PLAN:    In order of problems listed above:  1. Her blood pressure is at target repeat by me 142/70 in the office and no evidence of fluid overload continue her current loop diuretic along with antihypertensive hydralazine and carvedilol.  Renal function stable and improved from baseline 2. Stable continue home oxygen 3. Resolved potassium is now normal no longer on supplements   Next appointment: 6 months   Medication Adjustments/Labs and Tests Ordered: Current medicines are reviewed at length with the patient today.  Concerns regarding medicines are outlined above.  No orders of the defined types were placed in this encounter.  No orders of the defined types were placed in this encounter.   Chief Complaint  Patient presents with  . Follow-up  . Hypertension  . Congestive Heart Failure    History of Present Illness:    Donna Shaffer is a 74 y.o. female with a hx of diastolic heart failure, hypertension, Type 2 DM  and chest pain with a normal myocardial perfusion study at [facility] 09/19/2018. She was subsequently admitted to [facility] with severe refractory hypokalemia and chronic respiratory failure  treated with ambulatory oxygen  last seen 10/02/2019. Compliance with diet, lifestyle and medications: Yes   Last 2 weeks [facility] 12/24/2019 creatinine 1.24 GFR 43 cc potassium 4.6 cholesterol 169 LDL 99 triglycerides 132 HDL 47  She is doing better intermittently removes her  oxygen sats are greater than 90% she has less edema she is not short of breath no orthopnea chest pain palpitation or syncope.  Very frustrated by her nondiagnostic sleep study at this time prefers not to have a home study performed.  Recent labs from [facility] and the results of her sleep test reviewed with the patient  Sleep study 11/12/2019: IMPRESSIONS - No significant obstructive sleep apnea occurred during this study (AHI = 3.2/h). - No significant central sleep apnea occurred during this study (CAI = 0.0/h). - The patient had minimal or no oxygen desaturation during the study (Min O2 = 97.0%) - The patient snored with moderate snoring volume. - EKG findings include PVCs. - Clinically significant periodic limb movements did not occur during sleep. Associated arousals were significant.  DIAGNOSIS - Nondiagnostic Study  RECOMMENDATIONS - Recommend home sleep study as patient had very poor sleep at sleep lab. - Avoid alcohol, sedatives and other CNS depressants that may worsen sleep apnea and disrupt normal sleep architecture. - Sleep hygiene should be reviewed to assess factors that may improve sleep quality. - Weight management and regular exercise should be initiated or continued if appropriate.  Past Medical History:  Diagnosis Date  . Abnormal chest x-ray 12/20/2017  . Acute renal failure ([place]) 09/29/2018  . Anxiety   . Breast cancer ([place])    left  . Breast cancer of upper-outer quadrant of left female breast ([place]) 03/11/2015  . Cancer ([place])   . Chest pain 12/14/2017  . Chronic diastolic heart failure (  [place]) 10/03/2018  . Colon polyps   . Community acquired pneumonia of both lower lobes 03/18/2018  . Depression   . Dyspnea on exertion 12/14/2017  . Elevated liver enzymes   . Environmental and seasonal allergies 12/28/2017  . GERD without esophagitis 11/24/2016   Last Assessment & Plan:  Concern over throat awareness and difficulty swallowing. Several year history.   Symptoms seem to be worsening over the last several months.  She gets a lump in her throat sensation.  Some difficulty swallowing.  Much throat clearing and sensation of drainage.  She is tried Zyrtec without improvement. EXAMINATION shows no intranasal polyps or purulence.  Oropharynx is unrem  . Hypertension   . Hypertensive disorder 10/19/2018  . 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/14/2017  . Hypokalemia 10/18/2018  . Malignant neoplasm of upper-outer quadrant of female breast ([place]) 03/11/2015  . Medicare annual wellness visit, subsequent 12/28/2017  . Mild persistent asthma without complication 5/91/6384  . Moderate persistent asthma with exacerbation 12/28/2017  . Morbid obesity ([place]) 08/26/2015  . Obesity, morbid, BMI 40.0-49.9 ([place]) 08/26/2015  . OSA (obstructive sleep apnea) 09/29/2018  . Peripheral edema 09/15/2018  . Personal history of radiation therapy   . Pneumonia   . Radiation 04/09/15-05/23/15   left breast 50.4 gray, lumpectomy cavity boost 10 gray  . Recurrent major depressive disorder, in full remission ([place]) 09/15/2018  . Referred otalgia of both ears 11/22/2017   Last Assessment & Plan:  Formatting of this note might be different from the original. Concern over recurring ear pain. Chronic intermittent history of ear discomfort.  Symptoms resolved without treatment.  No history of ear surgery, trauma or infection. EXAMINATION shows normal external canal and tympanic membranes bilaterally.  TMJ and cervical musculature are okay.  Oropharynx and indirect lary  . SOB (shortness of breath) on exertion 12/14/2017  . Type 2 diabetes mellitus ([place]) 11/24/2016  . Type 2 diabetes mellitus with hyperglycemia, without long-term current use of insulin ([place]) 11/24/2016  . Type 2 diabetes mellitus with other specified complication ([place]) 08/26/5991   . Vitamin D deficiency 12/28/2017  . Wheezy bronchitis 03/20/2018    Past Surgical History:  Procedure Laterality Date  . ABDOMINAL HYSTERECTOMY    . APPENDECTOMY    . BILATERAL OOPHORECTOMY    . BREAST LUMPECTOMY Left 02/2015  . CATARACT EXTRACTION Bilateral   . CHOLECYSTECTOMY    . RADIOACTIVE SEED GUIDED PARTIAL MASTECTOMY WITH AXILLARY SENTINEL LYMPH NODE BIOPSY Left 02/28/2015   Procedure: RADIOACTIVE SEED GUIDED PARTIAL MASTECTOMY WITH AXILLARY SENTINEL LYMPH NODE BIOPSY;  Surgeon: Autumn Messing III, MD;  Location: [place];  Service: General;  Laterality: Left;    Current Medications: Current Meds  Medication Sig  . albuterol (PROVENTIL) (2.5 MG/3ML) 0.083% nebulizer solution Take 3 mLs (2.5 mg total) by nebulization every 6 (six) hours as needed for wheezing or shortness of breath.  Marland Kitchen albuterol (VENTOLIN HFA) 108 (90 Base) MCG/ACT inhaler Inhale 2 puffs into the lungs every 4 (four) hours as needed.  Marland Kitchen aspirin-acetaminophen-caffeine (EXCEDRIN MIGRAINE) 250-250-65 MG tablet Take 1 tablet by mouth every 8 (eight) hours as needed.  . calcium carbonate (CALCIUM 600) 600 MG TABS tablet Take 1 tablet by mouth daily.  . carvedilol (COREG) 6.25 MG tablet TAKE 1 TABLET(6.25 MG) BY MOUTH DAILY  . cholecalciferol (VITAMIN D) 1000 units tablet Take 1  tablet (1,000 Units total) by mouth daily.  . citalopram (CELEXA) 40 MG tablet Take 40 mg by mouth daily.  . hydrALAZINE (APRESOLINE) 25 MG tablet TAKE 1 TABLET(25 MG) BY MOUTH TWICE DAILY  . hydrOXYzine (ATARAX/VISTARIL) 25 MG tablet Take 25 mg by mouth 3 (three) times daily as needed.  . OXYGEN Inhale 3 L into the lungs continuous.  . pantoprazole (PROTONIX) 40 MG tablet Take 40 mg by mouth daily. Reported on 07/03/2015  . potassium chloride SA (KLOR-CON M20) 20 MEQ tablet Take 2 tablets (40 mEq total) by mouth daily. (please follow up with your PCP within a few days for repeat labs)     Allergies:   Other, Sulfa antibiotics,  Ace inhibitors, Sulfasalazine, and Iodinated diagnostic agents   Social History   Socioeconomic History  . Marital status: Single    Spouse name: Not on file  . Number of children: 2  . Years of education: Not on file  . Highest education level: Not on file  Occupational History  . Occupation: Proofreader  Tobacco Use  . Smoking status: Never Smoker  . Smokeless tobacco: Never Used  Vaping Use  . Vaping Use: Never used  Substance and Sexual Activity  . Alcohol use: No  . Drug use: No  . Sexual activity: Not Currently  Other Topics Concern  . Not on file  Social History Narrative  . Not on file   Social Determinants of Health   Financial Resource Strain:   . Difficulty of Paying Living Expenses: Not on file  Food Insecurity:   . Worried About Charity fundraiser in the Last Year: Not on file  . Ran Out of Food in the Last Year: Not on file  Transportation Needs:   . Lack of Transportation (Medical): Not on file  . Lack of Transportation (Non-Medical): Not on file  Physical Activity:   . Days of Exercise per Week: Not on file  . Minutes of Exercise per Session: Not on file  Stress:   . Feeling of Stress : Not on file  Social Connections:   . Frequency of Communication with Friends and Family: Not on file  . Frequency of Social Gatherings with Friends and Family: Not on file  . Attends Religious Services: Not on file  . Active Member of Clubs or Organizations: Not on file  . Attends Archivist Meetings: Not on file  . Marital Status: Not on file     Family History: The patient's family history includes Melanoma in her mother; Skin cancer in her father. ROS:   Please see the history of present illness.    All other systems reviewed and are negative.  EKGs/Labs/Other Studies Reviewed:    The following studies were reviewed today:    Recent Labs: 05/18/2019: Magnesium 1.9 10/02/2019: BUN 23; Creatinine, Ser 1.10; NT-Pro BNP 317; Potassium 4.0;  Sodium 137  Recent Lipid Panel No results found for: CHOL, TRIG, HDL, CHOLHDL, VLDL, LDLCALC, LDLDIRECT  Physical Exam:    VS:  BP (!) 177/84 (BP Location: Right Arm, Patient Position: Sitting, Cuff Size: Normal)   Pulse 87   Ht 5\' 2"  (1.575 m)   Wt 257 lb 12.8 oz (116.9 kg)   LMP  (LMP Unknown)   SpO2 97%   BMI 47.15 kg/m     Wt Readings from Last 3 Encounters:  01/02/20 257 lb 12.8 oz (116.9 kg)  11/12/19 260 lb (117.9 kg)  10/02/19 261 lb 6.4 oz (118.6 kg)  GEN: Obese BMI exceeds 45 well nourished, well developed in no acute distress HEENT: Normal NECK: No JVD; No carotid bruits LYMPHATICS: No lymphadenopathy CARDIAC: RRR, no murmurs, rubs, gallops RESPIRATORY:  Clear to auscultation without rales, wheezing or rhonchi  ABDOMEN: Soft, non-tender, non-distended MUSCULOSKELETAL:  No edema; No deformity  SKIN: Warm and dry NEUROLOGIC:  Alert and oriented x 3 PSYCHIATRIC:  Normal affect    Signed, Shirlee More, MD  01/02/2020 9:42 AM    [place]

## 2020-01-02 ENCOUNTER — Other Ambulatory Visit: Payer: Self-pay

## 2020-01-02 ENCOUNTER — Ambulatory Visit: Payer: Medicare Other | Admitting: Cardiology

## 2020-01-02 ENCOUNTER — Encounter: Payer: Self-pay | Admitting: Cardiology

## 2020-01-02 VITALS — BP 177/84 | HR 87 | Ht 62.0 in | Wt 257.8 lb

## 2020-01-02 DIAGNOSIS — I130 Hypertensive heart and chronic kidney disease with heart failure and stage 1 through stage 4 chronic kidney disease, or unspecified chronic kidney disease: Secondary | ICD-10-CM

## 2020-01-02 DIAGNOSIS — J9611 Chronic respiratory failure with hypoxia: Secondary | ICD-10-CM

## 2020-01-02 DIAGNOSIS — J9612 Chronic respiratory failure with hypercapnia: Secondary | ICD-10-CM

## 2020-01-02 DIAGNOSIS — E876 Hypokalemia: Secondary | ICD-10-CM

## 2020-01-02 NOTE — Patient Instructions (Signed)

## 2020-01-07 NOTE — Telephone Encounter (Signed)
Patient called on 01/04/20 to cancel her Home Sleep test stating she is declining to do the test.

## 2020-01-08 ENCOUNTER — Other Ambulatory Visit: Payer: Self-pay | Admitting: General Surgery

## 2020-01-08 DIAGNOSIS — Z853 Personal history of malignant neoplasm of breast: Secondary | ICD-10-CM

## 2020-01-17 ENCOUNTER — Encounter (HOSPITAL_BASED_OUTPATIENT_CLINIC_OR_DEPARTMENT_OTHER): Payer: Medicare Other | Admitting: Cardiology

## 2020-02-05 ENCOUNTER — Other Ambulatory Visit: Payer: Self-pay | Admitting: Cardiology

## 2020-02-05 DIAGNOSIS — J9612 Chronic respiratory failure with hypercapnia: Secondary | ICD-10-CM

## 2020-02-05 DIAGNOSIS — I130 Hypertensive heart and chronic kidney disease with heart failure and stage 1 through stage 4 chronic kidney disease, or unspecified chronic kidney disease: Secondary | ICD-10-CM

## 2020-02-05 DIAGNOSIS — E876 Hypokalemia: Secondary | ICD-10-CM

## 2020-02-05 DIAGNOSIS — I5032 Chronic diastolic (congestive) heart failure: Secondary | ICD-10-CM

## 2020-02-05 DIAGNOSIS — J9611 Chronic respiratory failure with hypoxia: Secondary | ICD-10-CM

## 2020-03-05 ENCOUNTER — Ambulatory Visit
Admission: RE | Admit: 2020-03-05 | Discharge: 2020-03-05 | Disposition: A | Discharge status: Home / Self Care | Payer: Medicare Other | Source: Ambulatory Visit | Attending: General Surgery | Admitting: General Surgery

## 2020-03-05 ENCOUNTER — Other Ambulatory Visit: Payer: Self-pay

## 2020-03-05 DIAGNOSIS — Z853 Personal history of malignant neoplasm of breast: Secondary | ICD-10-CM

## 2020-03-14 ENCOUNTER — Other Ambulatory Visit: Payer: Self-pay | Admitting: Cardiology

## 2020-03-14 DIAGNOSIS — E876 Hypokalemia: Secondary | ICD-10-CM

## 2020-03-14 DIAGNOSIS — I5032 Chronic diastolic (congestive) heart failure: Secondary | ICD-10-CM

## 2020-03-14 DIAGNOSIS — J9611 Chronic respiratory failure with hypoxia: Secondary | ICD-10-CM

## 2020-03-14 DIAGNOSIS — I130 Hypertensive heart and chronic kidney disease with heart failure and stage 1 through stage 4 chronic kidney disease, or unspecified chronic kidney disease: Secondary | ICD-10-CM

## 2020-08-08 DIAGNOSIS — I509 Heart failure, unspecified: Secondary | ICD-10-CM

## 2020-08-08 DIAGNOSIS — R059 Cough, unspecified: Secondary | ICD-10-CM | POA: Insufficient documentation

## 2020-08-08 HISTORY — DX: Cough, unspecified: R05.9

## 2020-08-08 HISTORY — DX: Heart failure, unspecified: I50.9

## 2020-08-13 ENCOUNTER — Other Ambulatory Visit: Payer: Self-pay | Admitting: Cardiology

## 2020-08-13 DIAGNOSIS — J9612 Chronic respiratory failure with hypercapnia: Secondary | ICD-10-CM

## 2020-08-13 DIAGNOSIS — I130 Hypertensive heart and chronic kidney disease with heart failure and stage 1 through stage 4 chronic kidney disease, or unspecified chronic kidney disease: Secondary | ICD-10-CM

## 2020-08-13 DIAGNOSIS — E876 Hypokalemia: Secondary | ICD-10-CM

## 2020-08-13 DIAGNOSIS — I5032 Chronic diastolic (congestive) heart failure: Secondary | ICD-10-CM

## 2020-08-13 DIAGNOSIS — J9611 Chronic respiratory failure with hypoxia: Secondary | ICD-10-CM

## 2020-08-13 NOTE — Telephone Encounter (Signed)
Refill sent to pharmacy.   

## 2020-08-20 NOTE — Progress Notes (Signed)
Cardiology Office Note:    Date:  08/21/2020   ID:  Donna Shaffer, Donna Shaffer 1946-03-19, MRN [id number]  PCP:  Donna Broker, MD  Cardiologist:  Donna More, MD    Referring MD: Donna Broker, MD    ASSESSMENT:    1. 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 OSA (obstructive sleep apnea)   3. Chronic respiratory failure with hypoxia and hypercapnia (HCC)   4. Moderate persistent asthma, unspecified whether complicated    PLAN:    In order of problems listed above:  1. Overall doing well, she is going to use her judgment take an extra dose of diuretic in the early afternoon as needed and her blood pressure has been at target at home.  Continue her current treatment including hydralazine and carvedilol along with her loop diuretic.  She continues potassium supplements with previous hypokalemia 2. Stable she uses nocturnal oxygen 3. I encouraged her to wear oxygen continuously during this episode of asthma until she is improved and then can follow her saturations at home and instruct her to be concerned with sats less than 89% 4. Improving with care by her primary care physician   Next appointment: 6 months   Medication Adjustments/Labs and Tests Ordered: Current medicines are reviewed at length with the patient today.  Concerns regarding medicines are outlined above.  No orders of the defined types were placed in this encounter.  Meds ordered this encounter  Medications  . torsemide (DEMADEX) 20 MG tablet    Sig: Take 1 tablet (20 mg total) by mouth 2 (two) times daily.    Dispense:  180 tablet    Refill:  3    Chief Complaint  Patient presents with  . Follow-up  . Congestive Heart Failure     History of Present Illness:    Donna Shaffer is a 75 y.o. female with a hx of hypertensive heart and chronic kidney disease hypokalemia and chronic  respiratory failure with hypoxia and hypercapnia.  She was last seen 01/02/2020.  She had a normal myocardial perfusion study 09/19/2018 at admission to [facility] following this with severe refractory hypokalemia chronic respiratory failure and has been treated with ambulatory oxygen.  Unfortunately sleep study was nondiagnostic unable to sleep.  Compliance with diet, lifestyle and medications: Yes  Recent labs [facility] PCP 08/08/2020 hemoglobin A1c 6.5% Echocardiogram 2019 showed EF of 60 to 65% with normal diastolic filling pressure. Myocardial perfusion study June 2020 showed no ischemia and normal ejection fraction Recent BNP level was not in the heart failure range 105. Recently seen with her PCP with a preceding flulike illness cough wheezing and shortness of breath diagnosed as asthma.  She was treated with bronchodilators Advair proBNP level was checked was not in the range of heart failure and she had a chest x-ray ordered.  Chest x-ray 08/09/2018 showed normal cardiac findings no infiltrates was interpreted as no active cardiopulmonary disease  Recent history of stroke (improved since and resolved.  She is also on immunologic therapy for psoriasis. She finds her self hypoxic with sats in the high 80s and very short of breath and her heart is rapid when she takes her oxygen off during the day She does not have edema but she thinks at times she is not getting a full response to her diuretic and she will take an extra dose in  the afternoon as needed. No chest pain no other palpitation no syncope. Past Medical History:  Diagnosis Date  . Abnormal chest x-ray 12/20/2017  . Acute renal failure ([place]) 09/29/2018  . Anxiety   . Breast cancer ([place])    left  . Breast cancer of upper-outer quadrant of left female breast ([place]) 03/11/2015  . Cancer ([place])   . Chest pain 12/14/2017  . Chronic diastolic heart failure ([place]) 10/03/2018  . Colon polyps   . Community acquired pneumonia  of both lower lobes 03/18/2018  . Depression   . Dyspnea on exertion 12/14/2017  . Elevated liver enzymes   . Environmental and seasonal allergies 12/28/2017  . GERD without esophagitis 11/24/2016   Last Assessment & Plan:  Concern over throat awareness and difficulty swallowing. Several year history.  Symptoms seem to be worsening over the last several months.  She gets a lump in her throat sensation.  Some difficulty swallowing.  Much throat clearing and sensation of drainage.  She is tried Zyrtec without improvement. EXAMINATION shows no intranasal polyps or purulence.  Oropharynx is unrem  . Hypertension   . Hypertensive disorder 10/19/2018  . 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/14/2017  . Hypokalemia 10/18/2018  . Malignant neoplasm of upper-outer quadrant of female breast ([place]) 03/11/2015  . Medicare annual wellness visit, subsequent 12/28/2017  . Mild persistent asthma without complication 1/44/3154  . Moderate persistent asthma with exacerbation 12/28/2017  . Morbid obesity ([place]) 08/26/2015  . Obesity, morbid, BMI 40.0-49.9 ([place]) 08/26/2015  . OSA (obstructive sleep apnea) 09/29/2018  . Peripheral edema 09/15/2018  . Personal history of radiation therapy   . Pneumonia   . Radiation 04/09/15-05/23/15   left breast 50.4 gray, lumpectomy cavity boost 10 gray  . Recurrent major depressive disorder, in full remission ([place]) 09/15/2018  . Referred otalgia of both ears 11/22/2017   Last Assessment & Plan:  Formatting of this note might be different from the original. Concern over recurring ear pain. Chronic intermittent history of ear discomfort.  Symptoms resolved without treatment.  No history of ear surgery, trauma or infection. EXAMINATION shows normal external canal and tympanic membranes bilaterally.  TMJ and cervical musculature are okay.   Oropharynx and indirect lary  . SOB (shortness of breath) on exertion 12/14/2017  . Type 2 diabetes mellitus ([place]) 11/24/2016  . Type 2 diabetes mellitus with hyperglycemia, without long-term current use of insulin ([place]) 11/24/2016  . Type 2 diabetes mellitus with other specified complication ([place]) 0/0/8676  . Vitamin D deficiency 12/28/2017  . Wheezy bronchitis 03/20/2018    Past Surgical History:  Procedure Laterality Date  . ABDOMINAL HYSTERECTOMY    . APPENDECTOMY    . BILATERAL OOPHORECTOMY    . BREAST LUMPECTOMY Left 02/2015  . CATARACT EXTRACTION Bilateral   . CHOLECYSTECTOMY    . RADIOACTIVE SEED GUIDED PARTIAL MASTECTOMY WITH AXILLARY SENTINEL LYMPH NODE BIOPSY Left 02/28/2015   Procedure: RADIOACTIVE SEED GUIDED PARTIAL MASTECTOMY WITH AXILLARY SENTINEL LYMPH NODE BIOPSY;  Surgeon: Autumn Messing III, MD;  Location: [place];  Service: General;  Laterality: Left;    Current Medications: Current Meds  Medication Sig  . albuterol (PROVENTIL) (2.5 MG/3ML) 0.083% nebulizer solution Take 3 mLs (2.5 mg total) by nebulization every 6 (six) hours as needed for wheezing or shortness of breath.  Marland Kitchen albuterol (VENTOLIN HFA) 108 (90 Base)  MCG/ACT inhaler Inhale 2 puffs into the lungs every 4 (four) hours as needed.  Marland Kitchen aspirin-acetaminophen-caffeine (EXCEDRIN MIGRAINE) 250-250-65 MG tablet Take 1 tablet by mouth every 8 (eight) hours as needed.  . calcium carbonate (OS-CAL) 600 MG TABS tablet Take 1 tablet by mouth daily.  . carvedilol (COREG) 6.25 MG tablet TAKE 1 TABLET(6.25 MG) BY MOUTH DAILY  . cholecalciferol (VITAMIN D) 1000 units tablet Take 1 tablet (1,000 Units total) by mouth daily.  . citalopram (CELEXA) 40 MG tablet Take 40 mg by mouth daily.  . hydrALAZINE (APRESOLINE) 25 MG tablet TAKE 1 TABLET(25 MG) BY MOUTH TWICE DAILY  . OXYGEN Inhale 3 L into the lungs continuous.  . pantoprazole (PROTONIX) 40 MG tablet Take 40 mg by mouth daily. Reported on 07/03/2015  .  potassium chloride SA (KLOR-CON M20) 20 MEQ tablet Take 2 tablets (40 mEq total) by mouth daily. (please follow up with your PCP within a few days for repeat labs)  . Secukinumab, 300 MG Dose, (COSENTYX, 300 MG DOSE,) 150 MG/ML SOSY Inject 300 mg into the skin every 30 (thirty) days.  Marland Kitchen torsemide (DEMADEX) 20 MG tablet Take 1 tablet (20 mg total) by mouth 2 (two) times daily.  . [DISCONTINUED] torsemide (DEMADEX) 20 MG tablet Take 1 tablet (20 mg total) by mouth daily.     Allergies:   Other, Sulfa antibiotics, Ace inhibitors, Sulfasalazine, and Iodinated diagnostic agents   Social History   Socioeconomic History  . Marital status: Single    Spouse name: Not on file  . Number of children: 2  . Years of education: Not on file  . Highest education level: Not on file  Occupational History  . Occupation: Proofreader  Tobacco Use  . Smoking status: Never Smoker  . Smokeless tobacco: Never Used  Vaping Use  . Vaping Use: Never used  Substance and Sexual Activity  . Alcohol use: No  . Drug use: No  . Sexual activity: Not Currently  Other Topics Concern  . Not on file  Social History Narrative  . Not on file   Social Determinants of Health   Financial Resource Strain: Not on file  Food Insecurity: Not on file  Transportation Needs: Not on file  Physical Activity: Not on file  Stress: Not on file  Social Connections: Not on file     Family History: The patient's family history includes Melanoma in her mother; Skin cancer in her father. ROS:   Please see the history of present illness.    All other systems reviewed and are negative.  EKGs/Labs/Other Studies Reviewed:    The following studies were reviewed today:  EKG:  EKG ordered today and personally reviewed.  The ekg ordered today demonstrates sinus rhythm left axis deviation otherwise normal EKG  Recent Labs: 10/02/2019: BUN 23; Creatinine, Ser 1.10; NT-Pro BNP 317; Potassium 4.0; Sodium 137   Physical Exam:     VS:  BP (!) 150/78 (BP Location: Right Arm, Patient Position: Sitting, Cuff Size: Normal)   Pulse 71   Ht 5\' 2"  (1.575 m)   Wt 269 lb 9.6 oz (122.3 kg)   LMP  (LMP Unknown)   SpO2 95%   BMI 49.31 kg/m     Wt Readings from Last 3 Encounters:  08/21/20 269 lb 9.6 oz (122.3 kg)  01/02/20 257 lb 12.8 oz (116.9 kg)  11/12/19 260 lb (117.9 kg)     GEN: Quite obese BMI approaches 50 well nourished, well developed in no acute distress HEENT:  Normal NECK: No JVD; No carotid bruits LYMPHATICS: No lymphadenopathy CARDIAC: RRR, no murmurs, rubs, gallops RESPIRATORY:  Clear to auscultation without rales, wheezing or rhonchi  ABDOMEN: Soft, non-tender, non-distended MUSCULOSKELETAL:  No edema; No deformity  SKIN: Warm and dry NEUROLOGIC:  Alert and oriented x 3 PSYCHIATRIC:  Normal affect    Signed, Donna More, MD  08/21/2020 11:49 AM    [place]

## 2020-08-21 ENCOUNTER — Other Ambulatory Visit: Payer: Self-pay

## 2020-08-21 ENCOUNTER — Encounter: Payer: Self-pay | Admitting: Cardiology

## 2020-08-21 ENCOUNTER — Ambulatory Visit: Payer: Medicare Other | Admitting: Cardiology

## 2020-08-21 VITALS — BP 150/78 | HR 71 | Ht 62.0 in | Wt 269.6 lb

## 2020-08-21 DIAGNOSIS — J4540 Moderate persistent asthma, uncomplicated: Secondary | ICD-10-CM

## 2020-08-21 DIAGNOSIS — J9611 Chronic respiratory failure with hypoxia: Secondary | ICD-10-CM

## 2020-08-21 DIAGNOSIS — G4733 Obstructive sleep apnea (adult) (pediatric): Secondary | ICD-10-CM

## 2020-08-21 DIAGNOSIS — J9612 Chronic respiratory failure with hypercapnia: Secondary | ICD-10-CM

## 2020-08-21 DIAGNOSIS — I130 Hypertensive heart and chronic kidney disease with heart failure and stage 1 through stage 4 chronic kidney disease, or unspecified chronic kidney disease: Secondary | ICD-10-CM

## 2020-08-21 MED ORDER — TORSEMIDE 20 MG PO TABS: 20.0000 mg | ORAL_TABLET | Freq: Two times a day (BID) | ORAL | 3 refills | Status: DC

## 2020-08-21 NOTE — Patient Instructions (Signed)

## 2020-08-21 NOTE — Addendum Note (Signed)
Addended by: Resa Miner I on: 08/21/2020 03:29 PM   Modules accepted: Orders

## 2020-09-29 ENCOUNTER — Other Ambulatory Visit: Payer: Self-pay | Admitting: Cardiology

## 2020-09-29 DIAGNOSIS — J9611 Chronic respiratory failure with hypoxia: Secondary | ICD-10-CM

## 2020-09-29 DIAGNOSIS — E876 Hypokalemia: Secondary | ICD-10-CM

## 2020-09-29 DIAGNOSIS — I130 Hypertensive heart and chronic kidney disease with heart failure and stage 1 through stage 4 chronic kidney disease, or unspecified chronic kidney disease: Secondary | ICD-10-CM

## 2020-09-29 DIAGNOSIS — I5032 Chronic diastolic (congestive) heart failure: Secondary | ICD-10-CM

## 2020-09-29 DIAGNOSIS — J9612 Chronic respiratory failure with hypercapnia: Secondary | ICD-10-CM

## 2020-11-03 DIAGNOSIS — B372 Candidiasis of skin and nail: Secondary | ICD-10-CM | POA: Insufficient documentation

## 2020-11-03 HISTORY — DX: Candidiasis of skin and nail: B37.2

## 2020-12-03 ENCOUNTER — Other Ambulatory Visit: Payer: Self-pay | Admitting: Family Medicine

## 2020-12-03 DIAGNOSIS — Z1231 Encounter for screening mammogram for malignant neoplasm of breast: Secondary | ICD-10-CM

## 2021-02-06 ENCOUNTER — Other Ambulatory Visit: Payer: Self-pay | Admitting: Cardiology

## 2021-02-06 DIAGNOSIS — I130 Hypertensive heart and chronic kidney disease with heart failure and stage 1 through stage 4 chronic kidney disease, or unspecified chronic kidney disease: Secondary | ICD-10-CM

## 2021-02-06 DIAGNOSIS — E876 Hypokalemia: Secondary | ICD-10-CM

## 2021-02-06 DIAGNOSIS — I5032 Chronic diastolic (congestive) heart failure: Secondary | ICD-10-CM

## 2021-02-06 DIAGNOSIS — J9611 Chronic respiratory failure with hypoxia: Secondary | ICD-10-CM

## 2021-02-06 DIAGNOSIS — J9612 Chronic respiratory failure with hypercapnia: Secondary | ICD-10-CM

## 2021-03-02 ENCOUNTER — Other Ambulatory Visit: Payer: Self-pay

## 2021-03-02 DIAGNOSIS — L409 Psoriasis, unspecified: Secondary | ICD-10-CM

## 2021-03-02 DIAGNOSIS — L4050 Arthropathic psoriasis, unspecified: Secondary | ICD-10-CM

## 2021-03-02 HISTORY — DX: Arthropathic psoriasis, unspecified: L40.50

## 2021-03-02 HISTORY — DX: Psoriasis, unspecified: L40.9

## 2021-03-07 NOTE — Progress Notes (Signed)
Cardiology Office Note:    Date:  03/10/2021   ID:  Donna, Shaffer June 25, 1945, MRN [id number]  PCP:  Myrlene Broker, MD  Cardiologist:  Shirlee More, MD    Referring MD: Myrlene Broker, MD    ASSESSMENT:    1. Severe persistent asthma, unspecified whether complicated   2. Hypertensive heart and chronic kidney disease with heart failure and stage 1 through stage 4 chronic kidney disease, or chronic kidney disease ([place])   3. OSA (obstructive sleep apnea)   4. Chronic respiratory failure with hypoxia and hypercapnia (HCC)    PLAN:     Approximately 9 PM last night and proceeded to call regarding elevated high-sensitivity troponin I phoned her home went to voicemail This morning my staff is calling her daughter and her grandchild and we have not received a response I am unsure whether the elevation is due to demand with the severity of her lung disease a reflection of underlying CAD but I think she should be seen in the emergency room at [facility] we should check a high-sensitivity troponin with our assay if elevated to the delta and if she requires admission I think she is best served by being on the medical service with cardiology consultation to optimize her pulmonary status and make a decision whether she is a good candidate for invasive cardiac procedures.  In order of problems listed above:  I think at this point time her predominant problem is asthma she has not been using a bronchodilator and obviously has severe asthma my office is been progressive over the last weeks.  We discussed referral to the emergency room she does not want to do that I [name] give her a short course of steroids over the next 2 weeks asked her to use her nebulizer 4 times daily have her see her family doctor in the interim and come back and see me in the office afterwards.  If improved I would not consider further cardiac evaluation.  Her heart failure looks compensated I think her  chest tightness is more asthma than angina we will check an EKG before leaving the office and we will recheck her blood pressure.  If unimproved I told her daughter to bring her to [facility] ED Recheck renal function today Intolerant of CPAP Oxygen sats are running 91% or greater   Next appointment: Me 3 weeks I want her to see her PCP before seeing me back in the office   Medication Adjustments/Labs and Tests Ordered: Current medicines are reviewed at length with the patient today.  Concerns regarding medicines are outlined above.  No orders of the defined types were placed in this encounter.  No orders of the defined types were placed in this encounter.   Chief Complaint  Patient presents with   Follow-up  She is very short of breath and bronchospastic History of Present Illness:    Donna Shaffer is a 75 y.o. female with a hx of hypertensive heart and chronic kidney disease previous hypokalemia and chronic respiratory failure with hypoxia and hypercapnia  Last seen 08/21/2020.  Compliance with diet, lifestyle and medications: Yes  She is here with her daughter she has steadily deteriorated is very limited because of severe exertional shortness of breath and chest tightness with any activities at home and is very apprehensive. Her daughter relates that she has been wheezing and is obvious walking in the room without a stethoscope she is bronchospastic. She is not having much  edema is not having orthopnea palpitation or syncope Past Medical History:  Diagnosis Date   Abnormal chest x-ray 12/20/2017   Acute renal failure ([place]) 09/29/2018   Anxiety    Breast cancer ([place])    left   Breast cancer of upper-outer quadrant of left female breast ([place]) 03/11/2015   Cancer ([place])    Chest pain 12/14/2017   Chronic diastolic heart failure ([place]) 10/03/2018   Colon polyps    Community acquired pneumonia of both lower lobes 03/18/2018   Congestive heart failure ([place])  08/08/2020   Cough 08/08/2020   Depression    Dyspnea on exertion 12/14/2017   Elevated liver enzymes    Environmental and seasonal allergies 12/28/2017   GERD without esophagitis 11/24/2016   Last Assessment & Plan:  Concern over throat awareness and difficulty swallowing. Several year history.  Symptoms seem to be worsening over the last several months.  She gets a lump in her throat sensation.  Some difficulty swallowing.  Much throat clearing and sensation of drainage.  She is tried Zyrtec without improvement. EXAMINATION shows no intranasal polyps or purulence.  Oropharynx is unrem   Hypertension    Hypertensive disorder 10/19/2018   Hypertensive heart and chronic kidney disease with heart failure and stage 1 through stage 4 chronic kidney disease, or chronic kidney disease ([place]) 12/14/2017   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/14/2017   Hypokalemia 10/18/2018   Malignant neoplasm of upper-outer quadrant of female breast ([place]) 03/11/2015   Medicare annual wellness visit, subsequent 12/28/2017   Mild persistent asthma without complication 67/67/2094   Moderate persistent asthma with exacerbation 12/28/2017   Morbid obesity ([place]) 08/26/2015   Obesity, morbid, BMI 40.0-49.9 ([place]) 08/26/2015   OSA (obstructive sleep apnea) 09/29/2018   Peripheral edema 09/15/2018   Personal history of radiation therapy    Pneumonia    Psoriasis 03/02/2021   Psoriatic arthritis ([place]) 03/02/2021   Recurrent major depressive disorder, in full remission ([place]) 09/15/2018   Referred otalgia of both ears 11/22/2017   Last Assessment & Plan:  Formatting of this note might be different from the original. Concern over recurring ear pain. Chronic intermittent history of ear discomfort.  Symptoms resolved without treatment.  No history of ear surgery, trauma or infection. EXAMINATION shows normal external canal and tympanic membranes bilaterally.  TMJ and cervical musculature are okay.  Oropharynx and indirect lary    SOB (shortness of breath) on exertion 12/14/2017   Type 2 diabetes mellitus ([place]) 11/24/2016   Type 2 diabetes mellitus with hyperglycemia, without long-term current use of insulin ([place]) 11/24/2016   Type 2 diabetes mellitus with other specified complication ([place]) 70/96/2836   Vitamin D deficiency 12/28/2017   Wheezy bronchitis 03/20/2018   Yeast dermatitis 11/03/2020    Past Surgical History:  Procedure Laterality Date   ABDOMINAL HYSTERECTOMY     APPENDECTOMY     BILATERAL OOPHORECTOMY     BREAST LUMPECTOMY Left 02/2015   CATARACT EXTRACTION Bilateral    CHOLECYSTECTOMY     RADIOACTIVE SEED GUIDED PARTIAL MASTECTOMY WITH AXILLARY SENTINEL LYMPH NODE BIOPSY Left 02/28/2015   Procedure: RADIOACTIVE SEED GUIDED PARTIAL MASTECTOMY WITH AXILLARY SENTINEL LYMPH NODE BIOPSY;  Surgeon: Autumn Messing III, MD;  Location: [facility];  Service: General;  Laterality: Left;    Current Medications: Current Meds  Medication Sig   albuterol (PROVENTIL) (2.5 MG/3ML) 0.083% nebulizer solution Take 3 mLs (2.5 mg total) by nebulization every 6 (six) hours as needed for wheezing or shortness of breath.  aspirin-acetaminophen-caffeine (EXCEDRIN MIGRAINE) 250-250-65 MG tablet Take 1 tablet by mouth every 8 (eight) hours as needed for migraine.   calcium carbonate (OS-CAL) 600 MG TABS tablet Take 1 tablet by mouth daily.   carvedilol (COREG) 6.25 MG tablet TAKE 1 TABLET(6.25 MG) BY MOUTH DAILY   cholecalciferol (VITAMIN D) 1000 units tablet Take 1 tablet (1,000 Units total) by mouth daily.   citalopram (CELEXA) 40 MG tablet Take 40 mg by mouth daily.   dicyclomine (BENTYL) 10 MG capsule Take 10 mg by mouth every 6 (six) hours as needed for bladder spasms.   hydrALAZINE (APRESOLINE) 25 MG tablet TAKE 1 TABLET(25 MG) BY MOUTH TWICE DAILY   nitroGLYCERIN (NITROSTAT) 0.4 MG SL tablet Place 0.4 mg under the tongue every 5 (five) minutes as needed for chest pain.   OXYGEN Inhale 3 L into the lungs  continuous.   pantoprazole (PROTONIX) 40 MG tablet Take 40 mg by mouth daily. Reported on 07/03/2015   potassium chloride SA (KLOR-CON M20) 20 MEQ tablet Take 2 tablets (40 mEq total) by mouth daily. (please follow up with your PCP within a few days for repeat labs)   Secukinumab, 300 MG Dose, (COSENTYX, 300 MG DOSE,) 150 MG/ML SOSY Inject 300 mg into the skin every 30 (thirty) days.   torsemide (DEMADEX) 20 MG tablet Take 20 mg by mouth 2 (two) times daily.     Allergies:   Sulfa antibiotics, Ace inhibitors, Sulfasalazine, Iodinated diagnostic agents, and Iohexol   Social History   Socioeconomic History   Marital status: Single    Spouse name: Not on file   Number of children: 2   Years of education: Not on file   Highest education level: Not on file  Occupational History   Occupation: Proofreader  Tobacco Use   Smoking status: Never   Smokeless tobacco: Never  Vaping Use   Vaping Use: Never used  Substance and Sexual Activity   Alcohol use: No   Drug use: No   Sexual activity: Not Currently  Other Topics Concern   Not on file  Social History Narrative   Not on file   Social Determinants of Health   Financial Resource Strain: Not on file  Food Insecurity: Not on file  Transportation Needs: Not on file  Physical Activity: Not on file  Stress: Not on file  Social Connections: Not on file     Family History: The patient's family history includes Melanoma in her mother; Skin cancer in her father. ROS:   Please see the history of present illness.    All other systems reviewed and are negative.  EKGs/Labs/Other Studies Reviewed:    The following studies were reviewed today:  Echocardiogram 2019 showed EF of 60 to 65% with normal diastolic filling pressure.  Myocardial perfusion study June 2020 showed no ischemia and normal ejection fraction  EKG:  EKG ordered today and personally reviewed.  The ekg ordered today demonstrates sinus rhythm no ischemic  changes  Recent Labs: She is had no recent labs  Physical Exam:    VS:  BP (!) 182/54    Pulse 80    Ht 5\' 2"  (1.575 m)    Wt 295 lb 9.6 oz (134.1 kg)    LMP  (LMP Unknown)    SpO2 95%    BMI 54.07 kg/m     Wt Readings from Last 3 Encounters:  03/10/21 295 lb 9.6 oz (134.1 kg)  08/21/20 269 lb 9.6 oz (122.3 kg)  01/02/20 257 lb 12.8  oz (116.9 kg)     GEN: She is very apprehensive well nourished, well developed in no acute distress she has diffuse polyphonic wheezing obvious sitting in the room next to her HEENT: Normal NECK: No JVD; No carotid bruits LYMPHATICS: No lymphadenopathy CARDIAC: Distant heart sounds RRR, no murmurs, rubs, gallops RESPIRATORY: Hyperinflated diffuse polyphonic wheezing ABDOMEN: Soft, non-tender, non-distended MUSCULOSKELETAL: 1+ lower extremity edema; No deformity  SKIN: Warm and dry NEUROLOGIC:  Alert and oriented x 3 PSYCHIATRIC:  Normal affect    Signed, Shirlee More, MD  03/10/2021 4:22 PM    [facility]

## 2021-03-09 ENCOUNTER — Ambulatory Visit: Payer: Medicare Other

## 2021-03-10 ENCOUNTER — Other Ambulatory Visit: Payer: Self-pay

## 2021-03-10 ENCOUNTER — Encounter: Payer: Self-pay | Admitting: Cardiology

## 2021-03-10 ENCOUNTER — Ambulatory Visit: Payer: Medicare Other | Admitting: Cardiology

## 2021-03-10 VITALS — BP 182/54 | HR 80 | Ht 62.0 in | Wt 295.6 lb

## 2021-03-10 DIAGNOSIS — J4550 Severe persistent asthma, uncomplicated: Secondary | ICD-10-CM | POA: Diagnosis not present

## 2021-03-10 DIAGNOSIS — J9611 Chronic respiratory failure with hypoxia: Secondary | ICD-10-CM

## 2021-03-10 DIAGNOSIS — G4733 Obstructive sleep apnea (adult) (pediatric): Secondary | ICD-10-CM | POA: Diagnosis not present

## 2021-03-10 DIAGNOSIS — R079 Chest pain, unspecified: Secondary | ICD-10-CM

## 2021-03-10 DIAGNOSIS — I130 Hypertensive heart and chronic kidney disease with heart failure and stage 1 through stage 4 chronic kidney disease, or unspecified chronic kidney disease: Secondary | ICD-10-CM

## 2021-03-10 DIAGNOSIS — J9612 Chronic respiratory failure with hypercapnia: Secondary | ICD-10-CM

## 2021-03-10 MED ORDER — PREDNISONE 20 MG PO TABS: ORAL_TABLET | ORAL | 0 refills | Status: AC

## 2021-03-10 NOTE — Patient Instructions (Signed)
Medication Instructions:  Your physician has recommended you make the following change in your medication:  START: Prednisone tapering dose pack Please use your nebulizer 4 times daily.  *If you need a refill on your cardiac medications before your next appointment, please call your pharmacy*   Lab Work: Your physician recommends that you return for lab work in: TODAY TROPONIN, CBC, BMP, PROBNP If you have labs (blood work) drawn today and your tests are completely normal, you will receive your results only by: [place] (if you have MyChart) OR A paper copy in the mail If you have any lab test that is abnormal or we need to change your treatment, we will call you to review the results.   Testing/Procedures: None   Follow-Up: At [facility], you and your health needs are our priority.  As part of our continuing mission to provide you with exceptional heart care, we have created designated Provider Care Teams.  These Care Teams include your primary Cardiologist (physician) and Advanced Practice Providers (APPs -  Physician Assistants and Nurse Practitioners) who all work together to provide you with the care you need, when you need it.  We recommend signing up for the patient portal called "MyChart".  Sign up information is provided on this After Visit Summary.  MyChart is used to connect with patients for Virtual Visits (Telemedicine).  Patients are able to view lab/test results, encounter notes, upcoming appointments, etc.  Non-urgent messages can be sent to your provider as well.   To learn more about what you can do with MyChart, go to [email or web address].    Your next appointment:   3 week(s)  The format for your next appointment:   In Person  Provider:   Shirlee More, MD    Other Instructions

## 2021-03-11 ENCOUNTER — Emergency Department (HOSPITAL_BASED_OUTPATIENT_CLINIC_OR_DEPARTMENT_OTHER): Payer: Medicare Other

## 2021-03-11 ENCOUNTER — Other Ambulatory Visit: Payer: Self-pay

## 2021-03-11 ENCOUNTER — Telehealth: Payer: Self-pay

## 2021-03-11 ENCOUNTER — Observation Stay (HOSPITAL_BASED_OUTPATIENT_CLINIC_OR_DEPARTMENT_OTHER)
Admission: EM | Admit: 2021-03-11 | Discharge: 2021-03-15 | Disposition: A | Discharge status: Home Health | Payer: Medicare Other | Attending: Family Medicine | Admitting: Family Medicine

## 2021-03-11 ENCOUNTER — Encounter (HOSPITAL_BASED_OUTPATIENT_CLINIC_OR_DEPARTMENT_OTHER): Payer: Self-pay | Admitting: *Deleted

## 2021-03-11 DIAGNOSIS — Z79899 Other long term (current) drug therapy: Secondary | ICD-10-CM | POA: Insufficient documentation

## 2021-03-11 DIAGNOSIS — E1165 Type 2 diabetes mellitus with hyperglycemia: Secondary | ICD-10-CM | POA: Diagnosis present

## 2021-03-11 DIAGNOSIS — Z20822 Contact with and (suspected) exposure to covid-19: Secondary | ICD-10-CM | POA: Diagnosis not present

## 2021-03-11 DIAGNOSIS — I5033 Acute on chronic diastolic (congestive) heart failure: Secondary | ICD-10-CM | POA: Insufficient documentation

## 2021-03-11 DIAGNOSIS — I509 Heart failure, unspecified: Secondary | ICD-10-CM

## 2021-03-11 DIAGNOSIS — I130 Hypertensive heart and chronic kidney disease with heart failure and stage 1 through stage 4 chronic kidney disease, or unspecified chronic kidney disease: Secondary | ICD-10-CM | POA: Diagnosis not present

## 2021-03-11 DIAGNOSIS — E662 Morbid (severe) obesity with alveolar hypoventilation: Secondary | ICD-10-CM | POA: Diagnosis present

## 2021-03-11 DIAGNOSIS — R2689 Other abnormalities of gait and mobility: Secondary | ICD-10-CM | POA: Insufficient documentation

## 2021-03-11 DIAGNOSIS — G4733 Obstructive sleep apnea (adult) (pediatric): Secondary | ICD-10-CM | POA: Diagnosis present

## 2021-03-11 DIAGNOSIS — Z7982 Long term (current) use of aspirin: Secondary | ICD-10-CM | POA: Insufficient documentation

## 2021-03-11 DIAGNOSIS — E1122 Type 2 diabetes mellitus with diabetic chronic kidney disease: Secondary | ICD-10-CM | POA: Insufficient documentation

## 2021-03-11 DIAGNOSIS — J9621 Acute and chronic respiratory failure with hypoxia: Principal | ICD-10-CM | POA: Insufficient documentation

## 2021-03-11 DIAGNOSIS — I10 Essential (primary) hypertension: Secondary | ICD-10-CM | POA: Diagnosis present

## 2021-03-11 DIAGNOSIS — Z853 Personal history of malignant neoplasm of breast: Secondary | ICD-10-CM | POA: Insufficient documentation

## 2021-03-11 DIAGNOSIS — C50412 Malignant neoplasm of upper-outer quadrant of left female breast: Secondary | ICD-10-CM | POA: Diagnosis present

## 2021-03-11 DIAGNOSIS — R079 Chest pain, unspecified: Secondary | ICD-10-CM

## 2021-03-11 DIAGNOSIS — B372 Candidiasis of skin and nail: Secondary | ICD-10-CM | POA: Diagnosis present

## 2021-03-11 DIAGNOSIS — N181 Chronic kidney disease, stage 1: Secondary | ICD-10-CM | POA: Insufficient documentation

## 2021-03-11 DIAGNOSIS — R7989 Other specified abnormal findings of blood chemistry: Secondary | ICD-10-CM | POA: Diagnosis present

## 2021-03-11 HISTORY — DX: Other specified anxiety disorders: F41.8

## 2021-03-11 LAB — COMPREHENSIVE METABOLIC PANEL
ALT: 49 U/L — ABNORMAL HIGH (ref 0–44)
AST: 53 U/L — ABNORMAL HIGH (ref 15–41)
Albumin: 3.4 g/dL — ABNORMAL LOW (ref 3.5–5.0)
Alkaline Phosphatase: 109 U/L (ref 38–126)
Anion gap: 8 (ref 5–15)
BUN: 27 mg/dL — ABNORMAL HIGH (ref 8–23)
CO2: 34 mmol/L — ABNORMAL HIGH (ref 22–32)
Calcium: 9.1 mg/dL (ref 8.9–10.3)
Chloride: 94 mmol/L — ABNORMAL LOW (ref 98–111)
Creatinine, Ser: 1.15 mg/dL — ABNORMAL HIGH (ref 0.44–1.00)
GFR, Estimated: 50 mL/min — ABNORMAL LOW (ref >60)
Glucose, Bld: 216 mg/dL — ABNORMAL HIGH (ref 70–99)
Potassium: 4.4 mmol/L (ref 3.5–5.1)
Sodium: 136 mmol/L (ref 135–145)
Total Bilirubin: 0.3 mg/dL (ref 0.3–1.2)
Total Protein: 8 g/dL (ref 6.5–8.1)

## 2021-03-11 LAB — BASIC METABOLIC PANEL
BUN/Creatinine Ratio: 24 (ref 12–28)
BUN: 27 mg/dL (ref 8–27)
CO2: 32 mmol/L — ABNORMAL HIGH (ref 20–29)
Calcium: 9.8 mg/dL (ref 8.7–10.3)
Chloride: 97 mmol/L (ref 96–106)
Creatinine, Ser: 1.13 mg/dL — ABNORMAL HIGH (ref 0.57–1.00)
Glucose: 146 mg/dL — ABNORMAL HIGH (ref 70–99)
Potassium: 4.4 mmol/L (ref 3.5–5.2)
Sodium: 142 mmol/L (ref 134–144)
eGFR: 51 mL/min/{1.73_m2} — ABNORMAL LOW (ref >59)

## 2021-03-11 LAB — CBC WITH DIFFERENTIAL/PLATELET
Abs Immature Granulocytes: 0.03 10*3/uL (ref 0.00–0.07)
Basophils Absolute: 0 10*3/uL (ref 0.0–0.1)
Basophils Relative: 0 %
Eosinophils Absolute: 0 10*3/uL (ref 0.0–0.5)
Eosinophils Relative: 0 %
HCT: 37.7 % (ref 36.0–46.0)
Hemoglobin: 12 g/dL (ref 12.0–15.0)
Immature Granulocytes: 0 %
Lymphocytes Relative: 7 %
Lymphs Abs: 0.5 10*3/uL — ABNORMAL LOW (ref 0.7–4.0)
MCH: 28.9 pg (ref 26.0–34.0)
MCHC: 31.8 g/dL (ref 30.0–36.0)
MCV: 90.8 fL (ref 80.0–100.0)
Monocytes Absolute: 0.4 10*3/uL (ref 0.1–1.0)
Monocytes Relative: 6 %
Neutro Abs: 6.1 10*3/uL (ref 1.7–7.7)
Neutrophils Relative %: 87 %
Platelets: 217 10*3/uL (ref 150–400)
RBC: 4.15 MIL/uL (ref 3.87–5.11)
RDW: 13.2 % (ref 11.5–15.5)
WBC: 7.1 10*3/uL (ref 4.0–10.5)
nRBC: 0 % (ref 0.0–0.2)

## 2021-03-11 LAB — TROPONIN T: Troponin T (Highly Sensitive): 29 ng/L (ref 0–14)

## 2021-03-11 LAB — PRO B NATRIURETIC PEPTIDE: NT-Pro BNP: 286 pg/mL (ref 0–301)

## 2021-03-11 LAB — TROPONIN I (HIGH SENSITIVITY): Troponin I (High Sensitivity): 10 ng/L (ref <18)

## 2021-03-11 LAB — CBC
Hematocrit: 39.1 % (ref 34.0–46.6)
Hemoglobin: 12.5 g/dL (ref 11.1–15.9)
MCH: 28 pg (ref 26.6–33.0)
MCHC: 32 g/dL (ref 31.5–35.7)
MCV: 88 fL (ref 79–97)
Platelets: 244 10*3/uL (ref 150–450)
RBC: 4.47 x10E6/uL (ref 3.77–5.28)
RDW: 12.6 % (ref 11.7–15.4)
WBC: 9.4 10*3/uL (ref 3.4–10.8)

## 2021-03-11 NOTE — ED Notes (Signed)
Patient assisted to bathroom via wheelchair and RN

## 2021-03-11 NOTE — Telephone Encounter (Signed)
Left message on patients voicemail to please return our call.   

## 2021-03-11 NOTE — ED Triage Notes (Signed)
Sent here for abnormal lab trop 29. Pt seen by PMS yesterday for labs. Pt states she feels good today

## 2021-03-11 NOTE — Telephone Encounter (Signed)
Received the following message from Dr. Bettina Gavia this morning:   I called last night went to voicemail  Best to go to [facility] ED Needs repeat our assay Likely needs admission however may be due to her severe asthma  I called the patient, the patients daughter, and the grandsons numbers as listed but none of them were able to answer. I did leave a message to try to reach them ASAP.

## 2021-03-11 NOTE — Telephone Encounter (Signed)
Patient notified of results by Dr. Bettina Gavia directly.

## 2021-03-11 NOTE — ED Provider Notes (Signed)
[facility] EMERGENCY DEPARTMENT Provider Note   CSN: [id number] Arrival date & time: 03/11/21  1935     History Chief Complaint  Patient presents with   abnormal labs    Donna Shaffer is a 75 y.o. female.  Patient is a 75 year old female with extensive past medical history including congestive heart failure on home oxygen at 3 L nasal cannula, chronic renal insufficiency, diabetes, obesity.  Patient sent here at the request of her primary doctor for further evaluation of an abnormal laboratory study.  Patient reports worsening shortness of breath with exertion over the past 6 months.  She reports episodes of chest discomfort with exertion as well.  She was seen yesterday by her primary doctor who obtained laboratory studies.  This revealed a troponin of 29, then patient was referred here for further evaluation.  She denies any chest pain at present.  She denies fevers, chills, or cough.  She does describe increased leg swelling which has gradually progressed over several months.  She reports being compliant with her torsemide.  The history is provided by the patient.      Past Medical History:  Diagnosis Date   Abnormal chest x-ray 12/20/2017   Acute renal failure ([place]) 09/29/2018   Anxiety    Breast cancer ([place])    left   Breast cancer of upper-outer quadrant of left female breast ([place]) 03/11/2015   Cancer ([place])    Chest pain 12/14/2017   Chronic diastolic heart failure ([place]) 10/03/2018   Colon polyps    Community acquired pneumonia of both lower lobes 03/18/2018   Congestive heart failure ([place]) 08/08/2020   Cough 08/08/2020   Depression    Dyspnea on exertion 12/14/2017   Elevated liver enzymes    Environmental and seasonal allergies 12/28/2017   GERD without esophagitis 11/24/2016   Last Assessment & Plan:  Concern over throat awareness and difficulty swallowing. Several year history.  Symptoms seem to be worsening over the last several months.  She gets  a lump in her throat sensation.  Some difficulty swallowing.  Much throat clearing and sensation of drainage.  She is tried Zyrtec without improvement. EXAMINATION shows no intranasal polyps or purulence.  Oropharynx is unrem   Hypertension    Hypertensive disorder 10/19/2018   Hypertensive heart and chronic kidney disease with heart failure and stage 1 through stage 4 chronic kidney disease, or chronic kidney disease ([place]) 12/14/2017   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/14/2017   Hypokalemia 10/18/2018   Malignant neoplasm of upper-outer quadrant of female breast ([place]) 03/11/2015   Medicare annual wellness visit, subsequent 12/28/2017   Mild persistent asthma without complication 74/10/1446   Moderate persistent asthma with exacerbation 12/28/2017   Morbid obesity ([place]) 08/26/2015   Obesity, morbid, BMI 40.0-49.9 ([place]) 08/26/2015   OSA (obstructive sleep apnea) 09/29/2018   Peripheral edema 09/15/2018   Personal history of radiation therapy    Pneumonia    Psoriasis 03/02/2021   Psoriatic arthritis ([place]) 03/02/2021   Recurrent major depressive disorder, in full remission ([place]) 09/15/2018   Referred otalgia of both ears 11/22/2017   Last Assessment & Plan:  Formatting of this note might be different from the original. Concern over recurring ear pain. Chronic intermittent history of ear discomfort.  Symptoms resolved without treatment.  No history of ear surgery, trauma or infection. EXAMINATION shows normal external canal and tympanic membranes bilaterally.  TMJ and cervical musculature are okay.  Oropharynx and indirect lary   SOB (shortness of breath) on  exertion 12/14/2017   Type 2 diabetes mellitus ([place]) 11/24/2016   Type 2 diabetes mellitus with hyperglycemia, without long-term current use of insulin ([place]) 11/24/2016   Type 2 diabetes mellitus with other specified complication ([place]) 78/29/5621   Vitamin D deficiency 12/28/2017   Wheezy bronchitis 03/20/2018   Yeast dermatitis  11/03/2020    Patient Active Problem List   Diagnosis Date Noted   Psoriasis 03/02/2021   Psoriatic arthritis ([place]) 03/02/2021   Yeast dermatitis 11/03/2020   Congestive heart failure ([place]) 08/08/2020   Cough 08/08/2020   Pneumonia    Personal history of radiation therapy    Hypertension    Depression    Colon polyps    Cancer ([place])    Breast cancer ([place])    Anxiety    Elevated liver enzymes    Hypertensive disorder 10/19/2018   Hypokalemia 10/18/2018   Chronic diastolic heart failure ([place]) 10/03/2018   Acute renal failure ([place]) 09/29/2018   OSA (obstructive sleep apnea) 09/29/2018   Peripheral edema 09/15/2018   Recurrent major depressive disorder, in full remission ([place]) 09/15/2018   Mild persistent asthma without complication 30/86/5784   Wheezy bronchitis 03/20/2018   Community acquired pneumonia of both lower lobes 03/18/2018   Environmental and seasonal allergies 12/28/2017   Medicare annual wellness visit, subsequent 12/28/2017   Moderate persistent asthma with exacerbation 12/28/2017   Vitamin D deficiency 12/28/2017   Abnormal chest x-ray 12/20/2017   Hypertensive heart and chronic kidney disease with heart failure and stage 1 through stage 4 chronic kidney disease, or chronic kidney disease ([place]) 12/14/2017   Chest pain 12/14/2017   SOB (shortness of breath) on exertion 12/14/2017   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/14/2017   Dyspnea on exertion 12/14/2017   Referred otalgia of both ears 11/22/2017   GERD without esophagitis 11/24/2016   Type 2 diabetes mellitus with other specified complication ([place]) 69/62/9528   Type 2 diabetes mellitus ([place]) 11/24/2016   Type 2 diabetes mellitus with hyperglycemia, without long-term current use of insulin ([place]) 11/24/2016   Obesity, morbid, BMI 40.0-49.9 ([place]) 08/26/2015   Morbid obesity ([place]) 08/26/2015   Breast cancer of upper-outer quadrant of left female breast ([place]) 03/11/2015   Malignant neoplasm of upper-outer  quadrant of female breast ([place]) 03/11/2015    Past Surgical History:  Procedure Laterality Date   ABDOMINAL HYSTERECTOMY     APPENDECTOMY     BILATERAL OOPHORECTOMY     BREAST LUMPECTOMY Left 02/2015   CATARACT EXTRACTION Bilateral    CHOLECYSTECTOMY     RADIOACTIVE SEED GUIDED PARTIAL MASTECTOMY WITH AXILLARY SENTINEL LYMPH NODE BIOPSY Left 02/28/2015   Procedure: RADIOACTIVE SEED GUIDED PARTIAL MASTECTOMY WITH AXILLARY SENTINEL LYMPH NODE BIOPSY;  Surgeon: Autumn Messing III, MD;  Location: [facility];  Service: General;  Laterality: Left;     OB History   No obstetric history on file.     Family History  Problem Relation Age of Onset   Melanoma Mother    Skin cancer Father     Social History   Tobacco Use   Smoking status: Never   Smokeless tobacco: Never  Vaping Use   Vaping Use: Never used  Substance Use Topics   Alcohol use: No   Drug use: No    Home Medications Prior to Admission medications   Medication Sig Start Date End Date Taking? Authorizing Provider  albuterol (PROVENTIL) (2.5 MG/3ML) 0.083% nebulizer solution Take 3 mLs (2.5 mg total) by nebulization every 6 (six) hours as needed for  wheezing or shortness of breath. 10/22/18   Elodia Florence., MD  aspirin-acetaminophen-caffeine (EXCEDRIN MIGRAINE) [phone] MG tablet Take 1 tablet by mouth every 8 (eight) hours as needed for migraine.    [provider]  calcium carbonate (OS-CAL) 600 MG TABS tablet Take 1 tablet by mouth daily.    [provider]  carvedilol (COREG) 6.25 MG tablet TAKE 1 TABLET(6.25 MG) BY MOUTH DAILY 02/06/21   Richardo Priest, MD  cholecalciferol (VITAMIN D) 1000 units tablet Take 1 tablet (1,000 Units total) by mouth daily. 08/26/15   Magrinat, Virgie Dad, MD  citalopram (CELEXA) 40 MG tablet Take 40 mg by mouth daily.    [provider]  dicyclomine (BENTYL) 10 MG capsule Take 10 mg by mouth every 6 (six) hours as needed for bladder spasms.  02/10/21   [provider]  hydrALAZINE (APRESOLINE) 25 MG tablet TAKE 1 TABLET(25 MG) BY MOUTH TWICE DAILY 09/29/20   Richardo Priest, MD  nitroGLYCERIN (NITROSTAT) 0.4 MG SL tablet Place 0.4 mg under the tongue every 5 (five) minutes as needed for chest pain.    [provider]  OXYGEN Inhale 3 L into the lungs continuous.    [provider]  pantoprazole (PROTONIX) 40 MG tablet Take 40 mg by mouth daily. Reported on 07/03/2015    [provider]  potassium chloride SA (KLOR-CON M20) 20 MEQ tablet Take 2 tablets (40 mEq total) by mouth daily. (please follow up with your PCP within a few days for repeat labs) 10/22/18 02/18/25  Elodia Florence., MD  predniSONE (DELTASONE) 20 MG tablet Take 1 tablet (20 mg total) by mouth 3 (three) times daily for 2 days, THEN 1 tablet (20 mg total) 2 (two) times daily for 2 days, THEN 1 tablet (20 mg total) daily with breakfast for 4 days, THEN 0.5 tablets (10 mg total) daily with breakfast for 4 days. 03/10/21 03/22/21  Richardo Priest, MD  Secukinumab, 300 MG Dose, (COSENTYX, 300 MG DOSE,) 150 MG/ML SOSY Inject 300 mg into the skin every 30 (thirty) days.    [provider]  torsemide (DEMADEX) 20 MG tablet Take 20 mg by mouth 2 (two) times daily.    [provider]    Allergies    Sulfa antibiotics, Ace inhibitors, Sulfasalazine, Iodinated diagnostic agents, and Iohexol  Review of Systems   Review of Systems  All other systems reviewed and are negative.  Physical Exam Updated Vital Signs BP (!) 177/76 (BP Location: Right Arm)    Pulse 76    Temp 97.7 F (36.5 C) (Oral)    Resp 14    Ht 5\' 2"  (1.575 m)    Wt 134.7 kg    LMP  (LMP Unknown)    SpO2 98%    BMI 54.32 kg/m   Physical Exam Vitals and nursing note reviewed.  Constitutional:      General: She is not in acute distress.    Appearance: She is well-developed. She is not diaphoretic.  HENT:     Head: Normocephalic and atraumatic.   Cardiovascular:     Rate and Rhythm: Normal rate and regular rhythm.     Heart sounds: No murmur heard.   No friction rub. No gallop.  Pulmonary:     Effort: Pulmonary effort is normal. No respiratory distress.     Breath sounds: Normal breath sounds. No wheezing.  Abdominal:     General: Bowel sounds are normal. There is no distension.  Palpations: Abdomen is soft.     Tenderness: There is no abdominal tenderness.  Musculoskeletal:        General: Normal range of motion.     Cervical back: Normal range of motion and neck supple.     Right lower leg: Edema present.     Left lower leg: Edema present.  Skin:    General: Skin is warm and dry.  Neurological:     General: No focal deficit present.     Mental Status: She is alert and oriented to person, place, and time.    ED Results / Procedures / Treatments   Labs (all labs ordered are listed, but only abnormal results are displayed) Labs Reviewed  CBC WITH DIFFERENTIAL/PLATELET - Abnormal; Notable for the following components:      Result Value   Lymphs Abs 0.5 (*)    All other components within normal limits  COMPREHENSIVE METABOLIC PANEL - Abnormal; Notable for the following components:   Chloride 94 (*)    CO2 34 (*)    Glucose, Bld 216 (*)    BUN 27 (*)    Creatinine, Ser 1.15 (*)    Albumin 3.4 (*)    AST 53 (*)    ALT 49 (*)    GFR, Estimated 50 (*)    All other components within normal limits  TROPONIN I (HIGH SENSITIVITY)    EKG ED ECG REPORT   Date: 03/12/2021  Rate: 71  Rhythm: normal sinus rhythm  QRS Axis: left  Intervals: normal  ST/T Wave abnormalities: normal  Conduction Disutrbances:none  Narrative Interpretation:   Old EKG Reviewed: none available  I have personally reviewed the EKG tracing and agree with the computerized printout as noted.   Radiology DG Chest 2 View  Result Date: 03/11/2021 CLINICAL DATA:  Shortness of breath. EXAM: CHEST - 2 VIEW COMPARISON:  Chest x-ray  08/08/2020. FINDINGS: The heart is enlarged as seen on the prior study. There is central pulmonary vascular congestion. There are minimal strandy opacities in the lung bases. There is no pleural effusion or pneumothorax. Left axillary surgical clips are present. No acute fractures are identified. IMPRESSION: 1. Cardiomegaly with mild interstitial edema. Electronically Signed   By: Ronney Asters M.D.   On: 03/11/2021 20:24    Procedures Procedures   Medications Ordered in ED Medications - No data to display  ED Course  I have reviewed the triage vital signs and the nursing notes.  Pertinent labs & imaging results that were available during my care of the patient were reviewed by me and considered in my medical decision making (see chart for details).    MDM Rules/Calculators/A&P  Patient with history of congestive heart failure presenting with complaints of shortness of breath.  This has been worsening over several months, but much worse over the past few days.  She describes only walking short distances before becoming dyspneic and having to stop.  Patient's work-up shows cardiomegaly with mild interstitial edema on chest x-ray and elevated BNP above baseline.  I suspect a component of CHF.  Patient given Lasix.  Remainder of laboratory studies essentially unremarkable.  The troponin she had performed at the doctor's office yesterday which was mildly elevated is now in the normal range.  Her EKG is unchanged.  Patient to be admitted for further evaluation and diuresis as well as rule out ACS.  I spoke with Dr. Alcario Drought who agrees to admit.  Final Clinical Impression(s) / ED Diagnoses Final diagnoses:  None  Rx / DC Orders ED Discharge Orders     None        Veryl Speak, MD 03/12/21 [phone]

## 2021-03-12 ENCOUNTER — Encounter (HOSPITAL_COMMUNITY): Payer: Self-pay | Admitting: Internal Medicine

## 2021-03-12 ENCOUNTER — Observation Stay (HOSPITAL_BASED_OUTPATIENT_CLINIC_OR_DEPARTMENT_OTHER): Payer: Medicare Other

## 2021-03-12 DIAGNOSIS — R2689 Other abnormalities of gait and mobility: Secondary | ICD-10-CM | POA: Diagnosis not present

## 2021-03-12 DIAGNOSIS — Z20822 Contact with and (suspected) exposure to covid-19: Secondary | ICD-10-CM | POA: Diagnosis not present

## 2021-03-12 DIAGNOSIS — N181 Chronic kidney disease, stage 1: Secondary | ICD-10-CM | POA: Diagnosis not present

## 2021-03-12 DIAGNOSIS — R079 Chest pain, unspecified: Secondary | ICD-10-CM | POA: Diagnosis not present

## 2021-03-12 DIAGNOSIS — I130 Hypertensive heart and chronic kidney disease with heart failure and stage 1 through stage 4 chronic kidney disease, or unspecified chronic kidney disease: Secondary | ICD-10-CM | POA: Diagnosis not present

## 2021-03-12 DIAGNOSIS — I5033 Acute on chronic diastolic (congestive) heart failure: Secondary | ICD-10-CM

## 2021-03-12 DIAGNOSIS — I5030 Unspecified diastolic (congestive) heart failure: Secondary | ICD-10-CM | POA: Diagnosis not present

## 2021-03-12 DIAGNOSIS — J9621 Acute and chronic respiratory failure with hypoxia: Secondary | ICD-10-CM | POA: Diagnosis not present

## 2021-03-12 DIAGNOSIS — E662 Morbid (severe) obesity with alveolar hypoventilation: Secondary | ICD-10-CM | POA: Diagnosis present

## 2021-03-12 DIAGNOSIS — R7989 Other specified abnormal findings of blood chemistry: Secondary | ICD-10-CM | POA: Diagnosis present

## 2021-03-12 DIAGNOSIS — Z7982 Long term (current) use of aspirin: Secondary | ICD-10-CM | POA: Diagnosis not present

## 2021-03-12 DIAGNOSIS — E1122 Type 2 diabetes mellitus with diabetic chronic kidney disease: Secondary | ICD-10-CM | POA: Diagnosis not present

## 2021-03-12 DIAGNOSIS — Z853 Personal history of malignant neoplasm of breast: Secondary | ICD-10-CM | POA: Diagnosis not present

## 2021-03-12 DIAGNOSIS — Z79899 Other long term (current) drug therapy: Secondary | ICD-10-CM | POA: Diagnosis not present

## 2021-03-12 LAB — HEMOGLOBIN A1C
Hgb A1c MFr Bld: 6.7 % — ABNORMAL HIGH (ref 4.8–5.6)
Mean Plasma Glucose: 145.59 mg/dL

## 2021-03-12 LAB — ECHOCARDIOGRAM COMPLETE
AR max vel: 1.71 cm2
AV Area VTI: 1.65 cm2
AV Area mean vel: 1.63 cm2
AV Mean grad: 11 mmHg
AV Peak grad: 19.4 mmHg
Ao pk vel: 2.2 m/s
Area-P 1/2: 3.65 cm2
Height: 62 in
S' Lateral: 3.4 cm
Weight: 4480 oz

## 2021-03-12 LAB — GLUCOSE, CAPILLARY: Glucose-Capillary: 141 mg/dL — ABNORMAL HIGH (ref 70–99)

## 2021-03-12 LAB — TROPONIN I (HIGH SENSITIVITY): Troponin I (High Sensitivity): 11 ng/L (ref <18)

## 2021-03-12 LAB — RESP PANEL BY RT-PCR (FLU A&B, COVID) ARPGX2
Influenza A by PCR: NEGATIVE
Influenza B by PCR: NEGATIVE
SARS Coronavirus 2 by RT PCR: NEGATIVE

## 2021-03-12 LAB — CBG MONITORING, ED: Glucose-Capillary: 143 mg/dL — ABNORMAL HIGH (ref 70–99)

## 2021-03-12 LAB — BRAIN NATRIURETIC PEPTIDE: B Natriuretic Peptide: 145 pg/mL — ABNORMAL HIGH (ref 0.0–100.0)

## 2021-03-12 MED ORDER — FUROSEMIDE 10 MG/ML IJ SOLN
40.0000 mg | Freq: Once | INTRAMUSCULAR | Status: AC
  Administered 2021-03-12: 05:00:00 40 mg via INTRAVENOUS
  Filled 2021-03-12: qty 4

## 2021-03-12 MED ORDER — OXYCODONE HCL 5 MG PO TABS
5.0000 mg | ORAL_TABLET | ORAL | Status: DC | PRN
  Administered 2021-03-12 – 2021-03-13 (×2): 5 mg via ORAL
  Filled 2021-03-12 (×2): qty 1

## 2021-03-12 MED ORDER — NYSTATIN 100000 UNIT/GM EX POWD
1.0000 "application " | Freq: Three times a day (TID) | CUTANEOUS | Status: DC
  Administered 2021-03-12 – 2021-03-15 (×7): 1 via TOPICAL
  Filled 2021-03-12: qty 15

## 2021-03-12 MED ORDER — LORAZEPAM 2 MG/ML IJ SOLN
1.0000 mg | Freq: Once | INTRAMUSCULAR | Status: AC
  Administered 2021-03-12: 04:00:00 1 mg via INTRAVENOUS
  Filled 2021-03-12: qty 1

## 2021-03-12 MED ORDER — SODIUM CHLORIDE 0.9% FLUSH
3.0000 mL | Freq: Two times a day (BID) | INTRAVENOUS | Status: DC
  Administered 2021-03-13 – 2021-03-15 (×5): 3 mL via INTRAVENOUS

## 2021-03-12 MED ORDER — BISACODYL 5 MG PO TBEC: 5.0000 mg | DELAYED_RELEASE_TABLET | Freq: Every day | ORAL | Status: DC | PRN

## 2021-03-12 MED ORDER — MORPHINE SULFATE (PF) 2 MG/ML IV SOLN
2.0000 mg | INTRAVENOUS | Status: DC | PRN
  Administered 2021-03-13: 21:00:00 2 mg via INTRAVENOUS
  Filled 2021-03-12: qty 1

## 2021-03-12 MED ORDER — ONDANSETRON HCL 4 MG PO TABS: 4.0000 mg | ORAL_TABLET | Freq: Four times a day (QID) | ORAL | Status: DC | PRN

## 2021-03-12 MED ORDER — HYDRALAZINE HCL 25 MG PO TABS
25.0000 mg | ORAL_TABLET | Freq: Two times a day (BID) | ORAL | Status: DC
  Administered 2021-03-12 (×2): 25 mg via ORAL
  Filled 2021-03-12 (×3): qty 1

## 2021-03-12 MED ORDER — IPRATROPIUM BROMIDE 0.02 % IN SOLN
0.5000 mg | Freq: Four times a day (QID) | RESPIRATORY_TRACT | Status: DC
  Administered 2021-03-12 – 2021-03-14 (×7): 0.5 mg via RESPIRATORY_TRACT
  Filled 2021-03-12 (×7): qty 2.5

## 2021-03-12 MED ORDER — PREDNISONE 20 MG PO TABS: 40.0000 mg | ORAL_TABLET | Freq: Every day | ORAL | Status: DC

## 2021-03-12 MED ORDER — INSULIN ASPART 100 UNIT/ML IJ SOLN
0.0000 [IU] | Freq: Three times a day (TID) | INTRAMUSCULAR | Status: DC
  Administered 2021-03-13: 17:00:00 3 [IU] via SUBCUTANEOUS
  Administered 2021-03-13: 12:00:00 2 [IU] via SUBCUTANEOUS
  Administered 2021-03-13: 08:00:00 3 [IU] via SUBCUTANEOUS
  Administered 2021-03-14: 16:00:00 5 [IU] via SUBCUTANEOUS
  Administered 2021-03-15: 12:00:00 3 [IU] via SUBCUTANEOUS

## 2021-03-12 MED ORDER — PREDNISONE 20 MG PO TABS
40.0000 mg | ORAL_TABLET | Freq: Every day | ORAL | Status: DC
  Administered 2021-03-12 – 2021-03-15 (×4): 40 mg via ORAL
  Filled 2021-03-12 (×4): qty 2

## 2021-03-12 MED ORDER — LOSARTAN POTASSIUM 25 MG PO TABS
25.0000 mg | ORAL_TABLET | Freq: Every day | ORAL | Status: DC
  Administered 2021-03-12 – 2021-03-13 (×2): 25 mg via ORAL
  Filled 2021-03-12 (×2): qty 1

## 2021-03-12 MED ORDER — CARVEDILOL 6.25 MG PO TABS
6.2500 mg | ORAL_TABLET | Freq: Every day | ORAL | Status: DC
  Administered 2021-03-12 – 2021-03-14 (×3): 6.25 mg via ORAL
  Filled 2021-03-12 (×3): qty 1

## 2021-03-12 MED ORDER — PERFLUTREN LIPID MICROSPHERE
1.0000 mL | INTRAVENOUS | Status: AC | PRN
Start: 2021-03-12 — End: 2021-03-12
  Administered 2021-03-12: 18:00:00 2 mL via INTRAVENOUS
  Filled 2021-03-12: qty 10

## 2021-03-12 MED ORDER — FUROSEMIDE 10 MG/ML IJ SOLN
40.0000 mg | Freq: Two times a day (BID) | INTRAMUSCULAR | Status: DC
  Administered 2021-03-12 – 2021-03-13 (×2): 40 mg via INTRAVENOUS
  Filled 2021-03-12 (×3): qty 4

## 2021-03-12 MED ORDER — INSULIN ASPART 100 UNIT/ML IJ SOLN: 0.0000 [IU] | Freq: Every day | INTRAMUSCULAR | Status: DC

## 2021-03-12 MED ORDER — ACETAMINOPHEN 650 MG RE SUPP: 650.0000 mg | Freq: Four times a day (QID) | RECTAL | Status: DC | PRN

## 2021-03-12 MED ORDER — ACETAMINOPHEN 500 MG PO TABS
1000.0000 mg | ORAL_TABLET | Freq: Once | ORAL | Status: AC
  Administered 2021-03-12: 13:00:00 1000 mg via ORAL
  Filled 2021-03-12: qty 2

## 2021-03-12 MED ORDER — SPIRONOLACTONE 12.5 MG HALF TABLET
12.5000 mg | ORAL_TABLET | Freq: Every day | ORAL | Status: DC
  Administered 2021-03-12 – 2021-03-15 (×4): 12.5 mg via ORAL
  Filled 2021-03-12 (×4): qty 1

## 2021-03-12 MED ORDER — GUAIFENESIN-DM 100-10 MG/5ML PO SYRP
5.0000 mL | ORAL_SOLUTION | ORAL | Status: DC | PRN
  Administered 2021-03-12 – 2021-03-15 (×3): 5 mL via ORAL
  Filled 2021-03-12 (×3): qty 5

## 2021-03-12 MED ORDER — ONDANSETRON HCL 4 MG/2ML IJ SOLN: 4.0000 mg | Freq: Four times a day (QID) | INTRAMUSCULAR | Status: DC | PRN

## 2021-03-12 MED ORDER — ENOXAPARIN SODIUM 40 MG/0.4ML IJ SOSY
40.0000 mg | PREFILLED_SYRINGE | INTRAMUSCULAR | Status: DC
  Administered 2021-03-12 – 2021-03-14 (×3): 40 mg via SUBCUTANEOUS
  Filled 2021-03-12 (×3): qty 0.4

## 2021-03-12 MED ORDER — LORAZEPAM 2 MG/ML IJ SOLN
0.5000 mg | Freq: Once | INTRAMUSCULAR | Status: AC
  Administered 2021-03-12: 13:00:00 0.5 mg via INTRAVENOUS
  Filled 2021-03-12: qty 1

## 2021-03-12 MED ORDER — PANTOPRAZOLE SODIUM 40 MG PO TBEC
40.0000 mg | DELAYED_RELEASE_TABLET | Freq: Every day | ORAL | Status: DC
  Administered 2021-03-13 – 2021-03-15 (×3): 40 mg via ORAL
  Filled 2021-03-12 (×3): qty 1

## 2021-03-12 MED ORDER — POLYETHYLENE GLYCOL 3350 17 G PO PACK: 17.0000 g | PACK | Freq: Every day | ORAL | Status: DC | PRN

## 2021-03-12 MED ORDER — ACETAMINOPHEN 325 MG PO TABS: 650.0000 mg | ORAL_TABLET | Freq: Four times a day (QID) | ORAL | Status: DC | PRN

## 2021-03-12 MED ORDER — HYDRALAZINE HCL 20 MG/ML IJ SOLN: 5.0000 mg | INTRAMUSCULAR | Status: DC | PRN

## 2021-03-12 MED ORDER — DOCUSATE SODIUM 100 MG PO CAPS
100.0000 mg | ORAL_CAPSULE | Freq: Two times a day (BID) | ORAL | Status: DC
  Administered 2021-03-14 – 2021-03-15 (×3): 100 mg via ORAL
  Filled 2021-03-12 (×6): qty 1

## 2021-03-12 MED ORDER — ALBUTEROL SULFATE (2.5 MG/3ML) 0.083% IN NEBU
2.5000 mg | INHALATION_SOLUTION | RESPIRATORY_TRACT | Status: DC | PRN
  Filled 2021-03-12: qty 3

## 2021-03-12 MED ORDER — CITALOPRAM HYDROBROMIDE 20 MG PO TABS
40.0000 mg | ORAL_TABLET | Freq: Every day | ORAL | Status: DC
  Administered 2021-03-12 – 2021-03-15 (×4): 40 mg via ORAL
  Filled 2021-03-12 (×5): qty 2
  Filled 2021-03-12: qty 1

## 2021-03-12 NOTE — Assessment & Plan Note (Signed)
-  As noted above, marked obesity which is likely contributing to her respiratory issues

## 2021-03-12 NOTE — Assessment & Plan Note (Addendum)
-  Patient has chronic respiratory failure and usually wears 3L [place] O2 -Her CO2 is elevated, indicating chronic lung disease -She has reported h/o asthma (no tobacco history) -She also very likely has OHS in addition to OSA -She reports recent wheezing but this appears to be more likely related to CHF  -She reports marked improvement with prednisone so will continue for presumed asthma component, as well as nebs

## 2021-03-12 NOTE — Assessment & Plan Note (Signed)
-  Patient with known h/o chronic diastolic CHF presenting with worsening SOB/DOE and wheezing -CXR consistent with mild pulmonary edema -Mildly elevated BNP with known baseline -With elevated BNP and abnl CXR, acute decompensated CHF seems probable as diagnosis -Will place in observation status with telemetry, as there are no current findings necessitating admission (hemodynamic instability, severe electrolyte abnormalities, cardiac arrhythmias, ACS, severe pulmonary edema requiring new O2 therapy, AMS) -Will request echocardiogram -ACE/ARB, beta blocker, and spironolactone are recommended as per guideline-directed medical therapy to reduce morbidity/mortality; will add ARB and spironolactone in order to facilitate possible transition to [facility] (during hospitalization or in the future) -CHF order set utilized -Cardiology consult -Was given Lasix 40 mg x 1 in ER and will repeat with 40 mg IV BID -Continue [place] O2 for now -Stable kidney function at this time, will follow -Repeat EKG in AM -Negative HS troponin; doubt ACS

## 2021-03-12 NOTE — ED Notes (Signed)
Report called to receiving RN, Mickel Baas on [employer] arrived for transport

## 2021-03-12 NOTE — ED Notes (Signed)
Pt heard shouting from inside her room. Upon entering her room pt found seated on L side of bed with feet in front of her. Pt states "I was dozing off but I had to go to the bathroom and I must've fallen". Pt denies injury, but c/o worsening SOB. MD Trifan notified.

## 2021-03-12 NOTE — Assessment & Plan Note (Signed)
-  Stage 1a in 2016 -Appears to be stable at this time

## 2021-03-12 NOTE — Progress Notes (Signed)
°  Echocardiogram 2D Echocardiogram has been performed.  Merrie Roof F 03/12/2021, 6:06 PM

## 2021-03-12 NOTE — Assessment & Plan Note (Signed)
-  Will order CPAP 

## 2021-03-12 NOTE — Assessment & Plan Note (Signed)
-  Body mass index is 51.21 kg/m..  -Weight loss should be encouraged -Outpatient PCP/bariatric medicine f/u encouraged

## 2021-03-12 NOTE — Plan of Care (Signed)
75 yo F with h/o CHF, on 3L O2 at baseline.  Increasing dyspnea and SOB over past few months, especially over past week.  Now CP with ambulation.  Went to Cards office, trop of 29, sent in to ED today.  Trops in ED were nl.  BNP 150.  Mild pulm edema on CXR.  Admitting to [facility] for CP r/o and CHF exacerbation.  [facility] will assume care on arrival to accepting facility. Until arrival, care as per EDP. However, [facility] available 24/7 for questions and assistance.  Nursing staff, please page [facility] and Consults [phone]) as soon as the patient arrives the hospital.

## 2021-03-12 NOTE — Assessment & Plan Note (Signed)
-  Improved, per patient report -Will use TID nystatin powder

## 2021-03-12 NOTE — Assessment & Plan Note (Signed)
-  Prior A1c was 6.2, not on medications; will recheck -Cover with moderate-scale SSI

## 2021-03-12 NOTE — Assessment & Plan Note (Signed)
-  Continue Coreg and hydralazine -Add Cozaar and spironolactone, as noted above -Will add prn IV hydralazine

## 2021-03-12 NOTE — Consult Note (Addendum)
Cardiology Consultation:   Patient ID: Donna Shaffer MRN: [id number]; DOB: 1945/08/22  Admit date: 03/11/2021 Date of Consult: 03/12/2021  PCP:  Myrlene Broker, MD   [facility] Providers Cardiologist:  Shirlee More, MD        Patient Profile:   Donna Shaffer is a 75 y.o. female with a hx of severe persistent asthma, HTN heart dx/HFPef, CKD,  T2DM, OSA patient of Dr. Bettina Gavia who is being seen 03/12/2021 for the evaluation of dyspnea at the request of Dr Lorin Mercy.  History of Present Illness:    Patient of Dr. Bettina Gavia. She's had a normal myocardial perfusion study 09/19/2018 at admission to [facility] following this with severe refractory hypokalemia chronic respiratory failure and has been treated with ambulatory oxygen. Unfortunately sleep study was nondiagnostic unable to sleep. Has stated though that diuretics have helped. She's maintained on coreg 6.25 mg BID, hydralazine. She's taking torsemide 20 mg daily. She has chronic SOB and chest tightness documented at least back to 2019. BNPs not remarkably elevated.   She Saw Dr. Bettina Gavia on 03/10/2021 ,She states recently it has increased. She saw Dr. Bettina Gavia and he was more concerned about her severe asthma and her  not using her bronchodilator. He felt that her chest tightness is asthma related. She felt her heart was tight. He started her on prednisone for a short course.Yesterday she felt intermittently better after seeing Dr Bettina Gavia then felt worse later on  He recommended going to the ED if she continued to feel unwell. He suggested checking tropoinin and EKG as well. She's on her home 3L  Here, her EKG shows NSR with no ischemic changes. Her troponin x2. COVID negative. BNP very mildly elevated 145. Chest xray shows mild interstitial edema. Weight is up 5 pounds from June.   Crt bsl ~1.06. 1.15 today  Vitals:   03/12/21 1400 03/12/21 1538  BP: (!) 166/70 (!) 152/75  Pulse: 74 79  Resp: (!) 25 19  Temp:     SpO2: 99% 100%     Wt Readings from Last 3 Encounters:  03/12/21 127 kg  03/10/21 134.1 kg  08/21/20 122.3 kg   Weight on discharge 10/22/2018 127 kg  Past Medical History:  Diagnosis Date   Breast cancer of upper-outer quadrant of left female breast ([place]) 03/11/2015   Chronic diastolic heart failure ([place]) 10/03/2018   Colon polyps    Community acquired pneumonia of both lower lobes 03/18/2018   Depression with anxiety    Elevated liver enzymes    Environmental and seasonal allergies 12/28/2017   GERD without esophagitis 11/24/2016   Last Assessment & Plan:  Concern over throat awareness and difficulty swallowing. Several year history.  Symptoms seem to be worsening over the last several months.  She gets a lump in her throat sensation.  Some difficulty swallowing.  Much throat clearing and sensation of drainage.  She is tried Zyrtec without improvement. EXAMINATION shows no intranasal polyps or purulence.  Oropharynx is unrem   Hypertension    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/14/2017   Mild persistent asthma without complication 40/98/1191   Morbid obesity ([place]) 08/26/2015   OSA (obstructive sleep apnea) 09/29/2018   Personal history of radiation therapy    Psoriasis 03/02/2021   Psoriatic arthritis ([place]) 03/02/2021   Type 2 diabetes mellitus with hyperglycemia, without long-term current use of insulin ([place]) 11/24/2016   Vitamin D deficiency 12/28/2017   Yeast dermatitis 11/03/2020    Past Surgical History:  Procedure Laterality  Date   ABDOMINAL HYSTERECTOMY     APPENDECTOMY     BILATERAL OOPHORECTOMY     BREAST LUMPECTOMY Left 02/2015   CATARACT EXTRACTION Bilateral    CHOLECYSTECTOMY     RADIOACTIVE SEED GUIDED PARTIAL MASTECTOMY WITH AXILLARY SENTINEL LYMPH NODE BIOPSY Left 02/28/2015   Procedure: RADIOACTIVE SEED GUIDED PARTIAL MASTECTOMY WITH AXILLARY SENTINEL LYMPH NODE BIOPSY;  Surgeon: Autumn Messing III, MD;  Location: [facility];  Service:  General;  Laterality: Left;     Home Medications:  Prior to Admission medications   Medication Sig Start Date End Date Taking? Authorizing Provider  albuterol (PROVENTIL) (2.5 MG/3ML) 0.083% nebulizer solution Take 3 mLs (2.5 mg total) by nebulization every 6 (six) hours as needed for wheezing or shortness of breath. 10/22/18  Yes Elodia Florence., MD  aspirin-acetaminophen-caffeine [facility] MIGRAINE) [phone] MG tablet Take 1 tablet by mouth every 8 (eight) hours as needed for migraine.   Yes [provider]  calcium carbonate (OS-CAL) 600 MG TABS tablet Take 1 tablet by mouth daily.   Yes [provider]  carvedilol (COREG) 6.25 MG tablet TAKE 1 TABLET(6.25 MG) BY MOUTH DAILY 02/06/21  Yes Richardo Priest, MD  cholecalciferol (VITAMIN D3) 25 MCG (1000 UNIT) tablet Take 1,000 Units by mouth daily.   Yes [provider]  citalopram (CELEXA) 40 MG tablet Take 40 mg by mouth daily.   Yes [provider]  dicyclomine (BENTYL) 10 MG capsule Take 10 mg by mouth every 6 (six) hours as needed for bladder spasms. 02/10/21  Yes [provider]  hydrALAZINE (APRESOLINE) 25 MG tablet TAKE 1 TABLET(25 MG) BY MOUTH TWICE DAILY 09/29/20  Yes Richardo Priest, MD  nitroGLYCERIN (NITROSTAT) 0.4 MG SL tablet Place 0.4 mg under the tongue every 5 (five) minutes as needed for chest pain.   Yes [provider]  pantoprazole (PROTONIX) 40 MG tablet Take 40 mg by mouth daily. Reported on 07/03/2015   Yes [provider]  potassium chloride SA (KLOR-CON M20) 20 MEQ tablet Take 2 tablets (40 mEq total) by mouth daily. (please follow up with your PCP within a few days for repeat labs) 10/22/18 02/18/25 Yes Elodia Florence., MD  predniSONE (DELTASONE) 20 MG tablet Take 1 tablet (20 mg total) by mouth 3 (three) times daily for 2 days, THEN 1 tablet (20 mg total) 2 (two) times daily for 2 days, THEN 1 tablet (20 mg total) daily with breakfast for 4 days,  THEN 0.5 tablets (10 mg total) daily with breakfast for 4 days. 03/10/21 03/22/21 Yes Munley, Hilton Cork, MD  Secukinumab, 300 MG Dose, (COSENTYX, 300 MG DOSE,) 150 MG/ML SOSY Inject 300 mg into the skin every 30 (thirty) days.   Yes [provider]  torsemide (DEMADEX) 20 MG tablet Take 20 mg by mouth as directed. Take 1 tablet daily & Take 1 tablet Daily PRN for Swelling   Yes [provider]  cholecalciferol (VITAMIN D) 1000 units tablet Take 1 tablet (1,000 Units total) by mouth daily. Patient not taking: Reported on 03/12/2021 08/26/15   Magrinat, Virgie Dad, MD  OXYGEN Inhale 3 L into the lungs continuous.    [provider]    Inpatient Medications: Scheduled Meds:  carvedilol  6.25 mg Oral Daily   citalopram  40 mg Oral Daily   docusate sodium  100 mg Oral BID   enoxaparin (LOVENOX) injection  40 mg Subcutaneous Q24H   furosemide  40 mg Intravenous BID  hydrALAZINE  25 mg Oral BID   ipratropium  0.5 mg Nebulization Q6H   losartan  25 mg Oral Daily   nystatin  1 application Topical TID   [START ON 03/13/2021] pantoprazole  40 mg Oral Daily   predniSONE  40 mg Oral Q breakfast   sodium chloride flush  3 mL Intravenous Q12H   Continuous Infusions:  PRN Meds: acetaminophen **OR** acetaminophen, albuterol, bisacodyl, hydrALAZINE, morphine injection, ondansetron **OR** ondansetron (ZOFRAN) IV, oxyCODONE, polyethylene glycol  Allergies:    Allergies  Allergen Reactions   Sulfa Antibiotics Swelling, Other (See Comments) and Hives   Ace Inhibitors Cough and Other (See Comments)   Sulfasalazine Swelling   Iodinated Diagnostic Agents    Iohexol     Social History:   Social History   Socioeconomic History   Marital status: Single    Spouse name: Not on file   Number of children: 2   Years of education: Not on file   Highest education level: Not on file  Occupational History   Occupation: Programme researcher, broadcasting/film/video, retired Therapist, sports  Tobacco Use   Smoking status:  Never   Smokeless tobacco: Never  Vaping Use   Vaping Use: Never used  Substance and Sexual Activity   Alcohol use: No   Drug use: No   Sexual activity: Not Currently  Other Topics Concern   Not on file  Social History Narrative   Not on file   Social Determinants of Health   Financial Resource Strain: Not on file  Food Insecurity: Not on file  Transportation Needs: Not on file  Physical Activity: Not on file  Stress: Not on file  Social Connections: Not on file  Intimate Partner Violence: Not on file    Family History:    Family History  Problem Relation Age of Onset   Melanoma Mother    Skin cancer Father      ROS:  Please see the history of present illness.   All other ROS reviewed and negative.     Physical Exam/Data:   Vitals:   03/12/21 0800 03/12/21 1035 03/12/21 1400 03/12/21 1538  BP: (!) 162/79 (!) 164/81 (!) 166/70 (!) 152/75  Pulse: 74 76 74 79  Resp: 15 (!) 24 (!) 25 19  Temp:  98.2 F (36.8 C)    TempSrc:  Oral    SpO2: 100% 100% 99% 100%  Weight:    127 kg  Height:    5\' 2"  (1.575 m)    Intake/Output Summary (Last 24 hours) at 03/12/2021 1742 Last data filed at 03/12/2021 1000 Gross per 24 hour  Intake 240 ml  Output --  Net 240 ml   Last 3 Weights 03/12/2021 03/11/2021 03/10/2021  Weight (lbs) 280 lb 297 lb 295 lb 9.6 oz  Weight (kg) 127.007 kg 134.718 kg 134.083 kg     Body mass index is 51.21 kg/m.  General:  Morbidly obese HEENT: normal Neck: JVD challenging with habitus Vascular: No carotid bruits; Distal pulses 2+ bilaterally Cardiac:  normal S1, S2; RRR; no murmur  Lungs:  clear to auscultation bilaterally, no wheezing, rhonchi or rales  Abd: enlarged, no distension Ext: trace edema Musculoskeletal:  No deformities, BUE and BLE strength normal and equal Skin: warm and dry  Neuro:  CNs 2-12 intact, no focal abnormalities noted Psych:  Normal affect   EKG:  The EKG was personally reviewed and demonstrates:   NSR no  ischemic changes   Telemetry:  Telemetry was personally reviewed and demonstrates:  NSR  Relevant CV Studies: TTE - Left ventricle: The cavity size was normal. Wall thickness was    increased in a pattern of mild LVH. Systolic function was normal.    The estimated ejection fraction was in the range of 60% to 65%.    Wall motion was normal; there were no regional wall motion    abnormalities. Doppler parameters are consistent with abnormal    left ventricular relaxation (grade 1 diastolic dysfunction).   Impressions:   - 1. Normal systolic function.    2. Impaired LV relaxation.    3. Small pericardial effusion versus fat pad.   Laboratory Data:  High Sensitivity Troponin:   Recent Labs  Lab 03/11/21 1950 03/11/21 2343  TROPONINIHS 10 11     Chemistry Recent Labs  Lab 03/10/21 1653 03/11/21 1950  NA 142 136  K 4.4 4.4  CL 97 94*  CO2 32* 34*  GLUCOSE 146* 216*  BUN 27 27*  CREATININE 1.13* 1.15*  CALCIUM 9.8 9.1  GFRNONAA  --  50*  ANIONGAP  --  8    Recent Labs  Lab 03/11/21 1950  PROT 8.0  ALBUMIN 3.4*  AST 53*  ALT 49*  ALKPHOS 109  BILITOT 0.3   Lipids No results for input(s): CHOL, TRIG, HDL, LABVLDL, LDLCALC, CHOLHDL in the last 168 hours.  Hematology Recent Labs  Lab 03/10/21 1653 03/11/21 1950  WBC 9.4 7.1  RBC 4.47 4.15  HGB 12.5 12.0  HCT 39.1 37.7  MCV 88 90.8  MCH 28.0 28.9  MCHC 32.0 31.8  RDW 12.6 13.2  PLT 244 217   Thyroid No results for input(s): TSH, FREET4 in the last 168 hours.  BNP Recent Labs  Lab 03/10/21 1653 03/11/21 2352  BNP  --  145.0*  PROBNP 286  --     DDimer No results for input(s): DDIMER in the last 168 hours.   Radiology/Studies:  DG Chest 2 View  Result Date: 03/11/2021 CLINICAL DATA:  Shortness of breath. EXAM: CHEST - 2 VIEW COMPARISON:  Chest x-ray 08/08/2020. FINDINGS: The heart is enlarged as seen on the prior study. There is central pulmonary vascular congestion. There are minimal strandy  opacities in the lung bases. There is no pleural effusion or pneumothorax. Left axillary surgical clips are present. No acute fractures are identified. IMPRESSION: 1. Cardiomegaly with mild interstitial edema. Electronically Signed   By: Ronney Asters M.D.   On: 03/11/2021 20:24     Assessment and Plan:   Silver Ceylin Dreibelbis is a 75 y.o. female with a hx of severe persistent asthma, HTN heart dx/HFpeF, CKD,  T2DM, OSA patient of Dr. Bettina Gavia who is being seen 03/12/2021 for the evaluation of dyspnea at the request of Dr Lorin Mercy.  #Chest tightness/DOE: She reports chronic chest pain/DOE with exacerbation that has increased. She is chronically dyspneic. BMI 51. Likely obesity hypoventilation is contributing. As well as underlying obstructive lung dx. Nuclear stress testing is challenging with significant abdominal girth as well as coronary CT.   - trend troponins x3 - if minimal with no sig delta or negative then unlikely ACS - will FU echo ( can assess for wall motion, TR, IVC) - on prednisone for c/f asthma exacerbation   #HFpeF: BNP is mild 145; can be underestimated 2/2 weight. She has not had persistent hospitalizations for CHF. Weight in June when she was seen in the office was 122kg and she is 127kg. Do not think is grossly overloaded. Her habitus is large and clinical assessment  is challenging. She's on her home O2. - can continue lasix IV for a day or two - if crt significant bump would switch back to home torsemide - Goal net negative 1-2 L - Daily weights - Strict I/Os - Fluid restriction 1500cc   #HTN: not well controlled - will see how responds on current regimen - cont  coreg 6.25 mg BID - cont hydralazine 25 mg BID - cont losartan 25 mg daily -  For questions or updates, please contact [facility] Please consult [email or web address] for contact info under    Signed, Janina Mayo, MD  03/12/2021 5:42 PM

## 2021-03-12 NOTE — ED Provider Notes (Signed)
I was notified by the staff the patient slept was found down by the side of her bed today.  She reports to me that she got up after dosing and thought she had used the bathroom, did not realize she had a pure wick on, and lost her footing and slid down the side of the bed.  She difficult time getting back up.  She did not pass out.  She denies any hip pain or pelvic pain.  She continues to have some mild to moderate chest pain.  Have ordered some Tylenol for her.  Otherwise I do not see any evidence of significant head trauma or traumatic injury.  She is now comfortably back in her bed, stable on 3 L nasal cannula, with a pure wick applied.   [name], MD 03/12/21 1051

## 2021-03-12 NOTE — H&P (Signed)
History and Physical    Patient: Donna Shaffer JSE:[id number] DOB: 01/05/1946 DOA: 03/11/2021 DOS: the patient was seen and examined on 03/12/2021 PCP: Myrlene Broker, MD  Patient coming from: Home - lives alone; NOK: Daughter, [name], [phone]  Chief Complaint: SOB  HPI: Donna Shaffer is a 75 y.o. female with medical history significant of remote breast cancer; chronic diastolic CHF; HTN; depression/anxiety; morbid obesity; OSA; psoriasis with psoriatic arthritis; and DM presenting with SOB.  She saw Dr. Bettina Gavia on 12/20 for a checkup.  She was SOB and wheezing although she couldn't tell this was a problem.  They wanted to get a troponin level on her and gave her an rx for steroids.  She went out to eat, got the medicine and went home.  She felt really good later than night, "I just knew it was a miracle."  She got up and walked from bedroom to kitchen and she didn't feel like her heart was balling up "like I was just going to die" although she was still wheezing.  She was very pleased.  She went to bed and Dr. Bettina Gavia started calling and she didn't answer because she was asleep and then her daughter showed up and said she had to go to the hospital.  She didn't want to go but went anyway and has been there from 7pm to now.  She is normally on 3L and is on that now.  She feels pretty good currently.  She didn't fall at [facility] - was trying to go to the bedside commode and was sleepy and couldn't find her call bell and she slid off the bed.  She did not hurt herself.  She seems to get worse with walking - she wheezes and gets SOB and feels like her heart is being squeezed.  She sits down to recover and it takes a couple of minutes..  It is exhausting to do anything like take a shower. + LE edema. +weight gain, maybe 30 pounds over 2-3 weeks.  She has nebs/MDIs and she thinks she hasn't been very good with her meds and so hasn't taking them since she thought she wasn't that bad.   +PND x 3-4 months.  She saw GI because of food and pill dysphagia (rarely liquid) but she couldn't finish that evaluation because of these issues.    [facility] to [facility] transfer, per Dr. Alcario Drought:  75 yo F with h/o CHF, on 3L O2 at baseline.  Increasing dyspnea and SOB over past few months, especially over past week.  Now CP with ambulation.  Went to Cards office, trop of 29, sent in to ED today.  Trops in ED were nl.  BNP 150.  Mild pulm edema on CXR.  Admitting for CP r/o and CHF exacerbation.     Review of Systems: ROS Past Medical History:  Diagnosis Date   Breast cancer of upper-outer quadrant of left female breast ([place]) 03/11/2015   Chronic diastolic heart failure ([place]) 10/03/2018   Colon polyps    Community acquired pneumonia of both lower lobes 03/18/2018   Depression with anxiety    Elevated liver enzymes    Environmental and seasonal allergies 12/28/2017   GERD without esophagitis 11/24/2016   Last Assessment & Plan:  Concern over throat awareness and difficulty swallowing. Several year history.  Symptoms seem to be worsening over the last several months.  She gets a lump in her throat sensation.  Some difficulty swallowing.  Much throat clearing and sensation of  drainage.  She is tried Zyrtec without improvement. EXAMINATION shows no intranasal polyps or purulence.  Oropharynx is unrem   Hypertension    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/14/2017   Mild persistent asthma without complication 73/71/0626   Morbid obesity ([place]) 08/26/2015   OSA (obstructive sleep apnea) 09/29/2018   Personal history of radiation therapy    Psoriasis 03/02/2021   Psoriatic arthritis ([place]) 03/02/2021   Type 2 diabetes mellitus with hyperglycemia, without long-term current use of insulin ([place]) 11/24/2016   Vitamin D deficiency 12/28/2017   Yeast dermatitis 11/03/2020   Past Surgical History:  Procedure Laterality Date   ABDOMINAL HYSTERECTOMY     APPENDECTOMY     BILATERAL OOPHORECTOMY      BREAST LUMPECTOMY Left 02/2015   CATARACT EXTRACTION Bilateral    CHOLECYSTECTOMY     RADIOACTIVE SEED GUIDED PARTIAL MASTECTOMY WITH AXILLARY SENTINEL LYMPH NODE BIOPSY Left 02/28/2015   Procedure: RADIOACTIVE SEED GUIDED PARTIAL MASTECTOMY WITH AXILLARY SENTINEL LYMPH NODE BIOPSY;  Surgeon: Autumn Messing III, MD;  Location: [place];  Service: General;  Laterality: Left;   Social History:  reports that she has never smoked. She has never used smokeless tobacco. She reports that she does not drink alcohol and does not use drugs.  Allergies  Allergen Reactions   Sulfa Antibiotics Swelling, Other (See Comments) and Hives   Ace Inhibitors Cough and Other (See Comments)   Sulfasalazine Swelling   Iodinated Diagnostic Agents    Iohexol     Family History  Problem Relation Age of Onset   Melanoma Mother    Skin cancer Father     Prior to Admission medications   Medication Sig Start Date End Date Taking? Authorizing Provider  aspirin-acetaminophen-caffeine (EXCEDRIN MIGRAINE) [phone] MG tablet Take 1 tablet by mouth every 8 (eight) hours as needed for migraine.   Yes [provider]  calcium carbonate (OS-CAL) 600 MG TABS tablet Take 1 tablet by mouth daily.   Yes [provider]  albuterol (PROVENTIL) (2.5 MG/3ML) 0.083% nebulizer solution Take 3 mLs (2.5 mg total) by nebulization every 6 (six) hours as needed for wheezing or shortness of breath. 10/22/18   Elodia Florence., MD  carvedilol (COREG) 6.25 MG tablet TAKE 1 TABLET(6.25 MG) BY MOUTH DAILY 02/06/21   Richardo Priest, MD  cholecalciferol (VITAMIN D) 1000 units tablet Take 1 tablet (1,000 Units total) by mouth daily. 08/26/15   Magrinat, Virgie Dad, MD  citalopram (CELEXA) 40 MG tablet Take 40 mg by mouth daily.    [provider]  dicyclomine (BENTYL) 10 MG capsule Take 10 mg by mouth every 6 (six) hours as needed for bladder spasms. 02/10/21   [provider]  hydrALAZINE  (APRESOLINE) 25 MG tablet TAKE 1 TABLET(25 MG) BY MOUTH TWICE DAILY 09/29/20   Richardo Priest, MD  nitroGLYCERIN (NITROSTAT) 0.4 MG SL tablet Place 0.4 mg under the tongue every 5 (five) minutes as needed for chest pain.    [provider]  OXYGEN Inhale 3 L into the lungs continuous.    [provider]  pantoprazole (PROTONIX) 40 MG tablet Take 40 mg by mouth daily. Reported on 07/03/2015    [provider]  potassium chloride SA (KLOR-CON M20) 20 MEQ tablet Take 2 tablets (40 mEq total) by mouth daily. (please follow up with your PCP within a few days for repeat labs) 10/22/18 02/18/25  Elodia Florence., MD  predniSONE (DELTASONE) 20 MG tablet Take 1 tablet (20  mg total) by mouth 3 (three) times daily for 2 days, THEN 1 tablet (20 mg total) 2 (two) times daily for 2 days, THEN 1 tablet (20 mg total) daily with breakfast for 4 days, THEN 0.5 tablets (10 mg total) daily with breakfast for 4 days. 03/10/21 03/22/21  Richardo Priest, MD  Secukinumab, 300 MG Dose, (COSENTYX, 300 MG DOSE,) 150 MG/ML SOSY Inject 300 mg into the skin every 30 (thirty) days.    [provider]  torsemide (DEMADEX) 20 MG tablet Take 20 mg by mouth 2 (two) times daily.    [provider]    Physical Exam: Vitals:   03/12/21 0800 03/12/21 1035 03/12/21 1400 03/12/21 1538  BP: (!) 162/79 (!) 164/81 (!) 166/70 (!) 152/75  Pulse: 74 76 74 79  Resp: 15 (!) 24 (!) 25 19  Temp:  98.2 F (36.8 C)    TempSrc:  Oral    SpO2: 100% 100% 99% 100%  Weight:    127 kg  Height:    5\' 2"  (1.575 m)   General:  Appears calm and comfortable and is in NAD, on 3L [place] O2 Eyes:   EOMI, normal lids, iris ENT:  grossly normal hearing, lips & tongue, mmm Neck:  no LAD, masses or thyromegaly Cardiovascular:  RRR, no m/r/g. Ta LE edema.  Respiratory:   CTA bilaterally with no wheezes/rales/rhonchi.  Normal to mildly increased respiratory effort. Abdomen:  morbidly obese, limited exam due to  habitus; marked pannus  Skin:  foul-smelling apparent candidal infection under pannus (patient reports improved) Musculoskeletal:  grossly normal tone BUE/BLE, good ROM, no bony abnormality Psychiatric:  grossly normal mood and affect, speech fluent and appropriate, AOx3 Neurologic:  CN 2-12 grossly intact, moves all extremities in coordinated fashion  Pertinent Labs:   CO2 34 Glucose 216 BUN 27/Creatinine 1.15/GFR 50 - stable BNP 145.0; 97.6 in 10/2018 HS troponin 10, 11 Normal CBC COVID/flu negative   Assessment/Plan * Acute on chronic respiratory failure with hypoxia (HCC)- (present on admission) -Patient has chronic respiratory failure and usually wears 3L [place] O2 -Her CO2 is elevated, indicating chronic lung disease -She has reported h/o asthma (no tobacco history) -She also very likely has OHS in addition to OSA -She reports recent wheezing but this appears to be more likely related to CHF  -She reports marked improvement with prednisone so will continue for presumed asthma component, as well as nebs  Obesity hypoventilation syndrome ([place])- (present on admission) -As noted above, marked obesity which is likely contributing to her respiratory issues  Acute on chronic heart failure with preserved ejection fraction (HFpEF) ([place])- (present on admission) -Patient with known h/o chronic diastolic CHF presenting with worsening SOB/DOE and wheezing -CXR consistent with mild pulmonary edema -Mildly elevated BNP with known baseline -With elevated BNP and abnl CXR, acute decompensated CHF seems probable as diagnosis -Will place in observation status with telemetry, as there are no current findings necessitating admission (hemodynamic instability, severe electrolyte abnormalities, cardiac arrhythmias, ACS, severe pulmonary edema requiring new O2 therapy, AMS) -Will request echocardiogram -ACE/ARB, beta blocker, and spironolactone are recommended as per guideline-directed medical therapy to  reduce morbidity/mortality; will add ARB and spironolactone in order to facilitate possible transition to [facility] (during hospitalization or in the future) -CHF order set utilized -Cardiology consult -Was given Lasix 40 mg x 1 in ER and will repeat with 40 mg IV BID -Continue [place] O2 for now -Stable kidney function at this time, will follow -Repeat EKG in AM -Negative HS  troponin; doubt ACS  Yeast dermatitis- (present on admission) -Improved, per patient report -Will use TID nystatin powder  Hypertension- (present on admission) -Continue Coreg and hydralazine -Add Cozaar and spironolactone, as noted above -Will add prn IV hydralazine  Type 2 diabetes mellitus with hyperglycemia, without long-term current use of insulin ([place])- (present on admission) -Prior A1c was 6.2, not on medications; will recheck -Cover with moderate-scale SSI   Morbid obesity ([place])- (present on admission) -Body mass index is 51.21 kg/m..  -Weight loss should be encouraged -Outpatient PCP/bariatric medicine f/u encouraged  OSA (obstructive sleep apnea)- (present on admission) -Will order CPAP  Breast cancer of upper-outer quadrant of left female breast ([place])- (present on admission) -Stage 1a in 2016 -Appears to be stable at this time    Advance Care Planning:   Code Status: Full Code   Consults: Cardiology; heart failure navigator; DM coordinator; [facility] team; PT/OT; nutrition  Family Communication: None present  Severity of Illness: The appropriate patient status for this patient is OBSERVATION. Observation status is judged to be reasonable and necessary in order to provide the required intensity of service to ensure the patient's safety. The patient's presenting symptoms, physical exam findings, and initial radiographic and laboratory data in the context of their medical condition is felt to place them at decreased risk for further clinical deterioration. Furthermore, it is anticipated that the patient  will be medically stable for discharge from the hospital within 2 midnights of admission.   Author: Karmen Bongo 03/12/2021 6:21 PM  For on call review [email or web address].

## 2021-03-12 NOTE — Progress Notes (Signed)
RT spoke to patient about wearing CPAP. Patient stated she does not wear CPAP at home and will not want to try to wear it here in the hospital.  RT will continue to monitor.

## 2021-03-13 DIAGNOSIS — I10 Essential (primary) hypertension: Secondary | ICD-10-CM

## 2021-03-13 DIAGNOSIS — R079 Chest pain, unspecified: Secondary | ICD-10-CM | POA: Diagnosis not present

## 2021-03-13 DIAGNOSIS — J9621 Acute and chronic respiratory failure with hypoxia: Secondary | ICD-10-CM | POA: Diagnosis not present

## 2021-03-13 LAB — CBC
HCT: 38.8 % (ref 36.0–46.0)
Hemoglobin: 12.5 g/dL (ref 12.0–15.0)
MCH: 29.1 pg (ref 26.0–34.0)
MCHC: 32.2 g/dL (ref 30.0–36.0)
MCV: 90.2 fL (ref 80.0–100.0)
Platelets: 249 10*3/uL (ref 150–400)
RBC: 4.3 MIL/uL (ref 3.87–5.11)
RDW: 13.3 % (ref 11.5–15.5)
WBC: 7.3 10*3/uL (ref 4.0–10.5)
nRBC: 0 % (ref 0.0–0.2)

## 2021-03-13 LAB — BASIC METABOLIC PANEL
Anion gap: 7 (ref 5–15)
BUN: 26 mg/dL — ABNORMAL HIGH (ref 8–23)
CO2: 37 mmol/L — ABNORMAL HIGH (ref 22–32)
Calcium: 9.2 mg/dL (ref 8.9–10.3)
Chloride: 94 mmol/L — ABNORMAL LOW (ref 98–111)
Creatinine, Ser: 1.36 mg/dL — ABNORMAL HIGH (ref 0.44–1.00)
GFR, Estimated: 41 mL/min — ABNORMAL LOW (ref >60)
Glucose, Bld: 180 mg/dL — ABNORMAL HIGH (ref 70–99)
Potassium: 4.1 mmol/L (ref 3.5–5.1)
Sodium: 138 mmol/L (ref 135–145)

## 2021-03-13 LAB — GLUCOSE, CAPILLARY
Glucose-Capillary: 161 mg/dL — ABNORMAL HIGH (ref 70–99)
Glucose-Capillary: 125 mg/dL — ABNORMAL HIGH (ref 70–99)
Glucose-Capillary: 162 mg/dL — ABNORMAL HIGH (ref 70–99)
Glucose-Capillary: 165 mg/dL — ABNORMAL HIGH (ref 70–99)

## 2021-03-13 MED ORDER — HYDRALAZINE HCL 50 MG PO TABS
50.0000 mg | ORAL_TABLET | Freq: Two times a day (BID) | ORAL | Status: DC
  Administered 2021-03-13 – 2021-03-15 (×5): 50 mg via ORAL
  Filled 2021-03-13 (×5): qty 1

## 2021-03-13 MED ORDER — LOSARTAN POTASSIUM 50 MG PO TABS: 100.0000 mg | ORAL_TABLET | Freq: Every day | ORAL | Status: DC

## 2021-03-13 MED ORDER — LOSARTAN POTASSIUM 50 MG PO TABS
75.0000 mg | ORAL_TABLET | Freq: Every day | ORAL | Status: DC
  Administered 2021-03-14 – 2021-03-15 (×2): 75 mg via ORAL
  Filled 2021-03-13 (×2): qty 1

## 2021-03-13 MED ORDER — LORAZEPAM 2 MG/ML IJ SOLN
1.0000 mg | Freq: Four times a day (QID) | INTRAMUSCULAR | Status: DC | PRN
  Administered 2021-03-13 – 2021-03-14 (×4): 1 mg via INTRAVENOUS
  Filled 2021-03-13 (×5): qty 1

## 2021-03-13 NOTE — Progress Notes (Signed)
Progress Note  Patient Name: Donna Shaffer Date of Encounter: 03/13/2021  [facility] Cardiologist: Shirlee More, MD    Subjective   Slight improvement in breathing, did not make much urine. Had twinges in the chest last night when she was anxious. Used to walk 50 yard without any issue, recently would get SOB after walking 30 feet to the bathroom.   Inpatient Medications    Scheduled Meds:  carvedilol  6.25 mg Oral Daily   citalopram  40 mg Oral Daily   docusate sodium  100 mg Oral BID   enoxaparin (LOVENOX) injection  40 mg Subcutaneous Q24H   furosemide  40 mg Intravenous BID   hydrALAZINE  25 mg Oral BID   insulin aspart  0-15 Units Subcutaneous TID WC   insulin aspart  0-5 Units Subcutaneous QHS   ipratropium  0.5 mg Nebulization Q6H   losartan  25 mg Oral Daily   nystatin  1 application Topical TID   pantoprazole  40 mg Oral Daily   predniSONE  40 mg Oral Q breakfast   sodium chloride flush  3 mL Intravenous Q12H   spironolactone  12.5 mg Oral Daily   Continuous Infusions:  PRN Meds: acetaminophen **OR** acetaminophen, albuterol, bisacodyl, guaiFENesin-dextromethorphan, hydrALAZINE, LORazepam, morphine injection, ondansetron **OR** ondansetron (ZOFRAN) IV, oxyCODONE, polyethylene glycol   Vital Signs    Vitals:   03/13/21 0213 03/13/21 0219 03/13/21 0430 03/13/21 0814  BP: (!) 161/85  (!) 162/72 (!) 162/75  Pulse: 72  72 73  Resp:   18 (!) 22  Temp:   (!) 97.5 F (36.4 C) (!) 97.4 F (36.3 C)  TempSrc:   Oral Oral  SpO2:  99% 98% 98%  Weight:   131.2 kg   Height:        Intake/Output Summary (Last 24 hours) at 03/13/2021 0836 Last data filed at 03/13/2021 0816 Gross per 24 hour  Intake 820 ml  Output 950 ml  Net -130 ml   Last 3 Weights 03/13/2021 03/12/2021 03/11/2021  Weight (lbs) 289 lb 3.2 oz 280 lb 297 lb  Weight (kg) 131.18 kg 127.007 kg 134.718 kg      Telemetry    NSR without significant ventricular ectopy. The reported VT  were all motion artifacts. - Personally Reviewed  ECG    NSR without significant ST-T wave changes - Personally Reviewed  Physical Exam   GEN: No acute distress.   Neck: No JVD Cardiac: RRR, no murmurs, rubs, or gallops.  Respiratory: Clear to auscultation bilaterally. GI: Soft, nontender, non-distended  MS: No edema; No deformity. Neuro:  Nonfocal  Psych: Normal affect   Labs    High Sensitivity Troponin:   Recent Labs  Lab 03/11/21 1950 03/11/21 2343  TROPONINIHS 10 11     Chemistry Recent Labs  Lab 03/10/21 1653 03/11/21 1950 03/13/21 0237  NA 142 136 138  K 4.4 4.4 4.1  CL 97 94* 94*  CO2 32* 34* 37*  GLUCOSE 146* 216* 180*  BUN 27 27* 26*  CREATININE 1.13* 1.15* 1.36*  CALCIUM 9.8 9.1 9.2  PROT  --  8.0  --   ALBUMIN  --  3.4*  --   AST  --  53*  --   ALT  --  49*  --   ALKPHOS  --  109  --   BILITOT  --  0.3  --   GFRNONAA  --  50* 41*  ANIONGAP  --  8 7    Lipids  No results for input(s): CHOL, TRIG, HDL, LABVLDL, LDLCALC, CHOLHDL in the last 168 hours.  Hematology Recent Labs  Lab 03/10/21 1653 03/11/21 1950 03/13/21 0237  WBC 9.4 7.1 7.3  RBC 4.47 4.15 4.30  HGB 12.5 12.0 12.5  HCT 39.1 37.7 38.8  MCV 88 90.8 90.2  MCH 28.0 28.9 29.1  MCHC 32.0 31.8 32.2  RDW 12.6 13.2 13.3  PLT 244 217 249   Thyroid No results for input(s): TSH, FREET4 in the last 168 hours.  BNP Recent Labs  Lab 03/10/21 1653 03/11/21 2352  BNP  --  145.0*  PROBNP 286  --     DDimer No results for input(s): DDIMER in the last 168 hours.   Radiology    DG Chest 2 View  Result Date: 03/11/2021 CLINICAL DATA:  Shortness of breath. EXAM: CHEST - 2 VIEW COMPARISON:  Chest x-ray 08/08/2020. FINDINGS: The heart is enlarged as seen on the prior study. There is central pulmonary vascular congestion. There are minimal strandy opacities in the lung bases. There is no pleural effusion or pneumothorax. Left axillary surgical clips are present. No acute fractures are  identified. IMPRESSION: 1. Cardiomegaly with mild interstitial edema. Electronically Signed   By: Ronney Asters M.D.   On: 03/11/2021 20:24   ECHOCARDIOGRAM COMPLETE  Result Date: 03/12/2021    ECHOCARDIOGRAM REPORT   Patient Name:   Donna Shaffer Date of Exam: 03/12/2021 Medical Rec #:  [id number]             Height:       62.0 in Accession #:    [id number]            Weight:       280.0 lb Date of Birth:  [birth year]              BSA:          2.206 m Patient Age:    75 years              BP:           152/75 mmHg Patient Gender: F                     HR:           74 bpm. Exam Location:  Inpatient Procedure: 2D Echo, Color Doppler, Cardiac Doppler and Intracardiac            Opacification Agent Indications:    CHF  History:        Patient has prior history of Echocardiogram examinations, most                 recent 03/28/2017. Risk Factors:Morbid obesity. Asthma. Elevated                 troponins.  Sonographer:    Merrie Roof RDCS Referring Phys: [id number] JENNIFER YATES  Sonographer Comments: Patient is morbidly obese and no subcostal window. IMPRESSIONS  1. Left ventricular ejection fraction, by estimation, is 60 to 65%. The left ventricle has normal function. The left ventricle has no regional wall motion abnormalities. The left ventricular internal cavity size was mildly dilated. There is mild concentric left ventricular hypertrophy. Left ventricular diastolic parameters are consistent with Grade II diastolic dysfunction (pseudonormalization). Elevated left ventricular end-diastolic pressure.  2. Right ventricular systolic function is normal. The right ventricular size is normal.  3. Left atrial size was mild to moderately dilated.  4. The mitral valve was not well  visualized. Trivial mitral valve regurgitation. No evidence of mitral stenosis. Moderate mitral annular calcification.  5. The aortic valve was not well visualized. Aortic valve regurgitation is mild. Comparison(s): No significant change from  prior study. Conclusion(s)/Recommendation(s): Technically challenging images even with echo contrast. Normal LVEF, no severe valve disease by Doppler. FINDINGS  Left Ventricle: Left ventricular ejection fraction, by estimation, is 60 to 65%. The left ventricle has normal function. The left ventricle has no regional wall motion abnormalities. Definity contrast agent was given IV to delineate the left ventricular  endocardial borders. The left ventricular internal cavity size was mildly dilated. There is mild concentric left ventricular hypertrophy. Left ventricular diastolic parameters are consistent with Grade II diastolic dysfunction (pseudonormalization). Elevated left ventricular end-diastolic pressure. Right Ventricle: The right ventricular size is normal. Right vetricular wall thickness was not well visualized. Right ventricular systolic function is normal. Left Atrium: Left atrial size was mild to moderately dilated. Right Atrium: Right atrial size was not well visualized. Pericardium: There is no evidence of pericardial effusion. Presence of epicardial fat layer. Mitral Valve: The mitral valve was not well visualized. Moderate mitral annular calcification. Trivial mitral valve regurgitation. No evidence of mitral valve stenosis. Tricuspid Valve: The tricuspid valve is not well visualized. Tricuspid valve regurgitation is trivial. No evidence of tricuspid stenosis. Aortic Valve: The aortic valve was not well visualized. Aortic valve regurgitation is mild. Aortic valve mean gradient measures 11.0 mmHg. Aortic valve peak gradient measures 19.4 mmHg. Aortic valve area, by VTI measures 1.65 cm. Pulmonic Valve: The pulmonic valve was not well visualized. Pulmonic valve regurgitation is not visualized. Aorta: The aortic root, ascending aorta and aortic arch are all structurally normal, with no evidence of dilitation or obstruction. Venous: The inferior vena cava was not well visualized. IAS/Shunts: The interatrial  septum was not well visualized.  LEFT VENTRICLE PLAX 2D LVIDd:         5.60 cm   Diastology LVIDs:         3.40 cm   LV e' medial:    8.38 cm/s LV PW:         1.30 cm   LV E/e' medial:  16.3 LV IVS:        1.30 cm   LV e' lateral:   4.68 cm/s LVOT diam:     2.00 cm   LV E/e' lateral: 29.3 LV SV:         74 LV SV Index:   34 LVOT Area:     3.14 cm  RIGHT VENTRICLE RV Basal diam:  3.00 cm LEFT ATRIUM         Index       RIGHT ATRIUM           Index LA diam:    4.50 cm 2.04 cm/m  RA Area:     15.20 cm                                 RA Volume:   36.00 ml  16.32 ml/m  AORTIC VALVE AV Area (Vmax):    1.71 cm AV Area (Vmean):   1.63 cm AV Area (VTI):     1.65 cm AV Vmax:           220.00 cm/s AV Vmean:          155.000 cm/s AV VTI:            0.450 m AV Peak  Grad:      19.4 mmHg AV Mean Grad:      11.0 mmHg LVOT Vmax:         120.00 cm/s LVOT Vmean:        80.400 cm/s LVOT VTI:          0.236 m LVOT/AV VTI ratio: 0.52  AORTA Ao Root diam: 3.20 cm Ao Asc diam:  2.80 cm MITRAL VALVE MV Area (PHT): 3.65 cm     SHUNTS MV Decel Time: 208 msec     Systemic VTI:  0.24 m MV E velocity: 137.00 cm/s  Systemic Diam: 2.00 cm MV A velocity: 96.50 cm/s MV E/A ratio:  1.42 Buford Dresser MD Electronically signed by Buford Dresser MD Signature Date/Time: 03/12/2021/8:10:06 PM    Final     Cardiac Studies   Echo 03/12/2021 1. Left ventricular ejection fraction, by estimation, is 60 to 65%. The  left ventricle has normal function. The left ventricle has no regional  wall motion abnormalities. The left ventricular internal cavity size was  mildly dilated. There is mild  concentric left ventricular hypertrophy. Left ventricular diastolic  parameters are consistent with Grade II diastolic dysfunction  (pseudonormalization). Elevated left ventricular end-diastolic pressure.   2. Right ventricular systolic function is normal. The right ventricular  size is normal.   3. Left atrial size was mild to moderately  dilated.   4. The mitral valve was not well visualized. Trivial mitral valve  regurgitation. No evidence of mitral stenosis. Moderate mitral annular  calcification.   5. The aortic valve was not well visualized. Aortic valve regurgitation  is mild.   Comparison(s): No significant change from prior study.   Conclusion(s)/Recommendation(s): Technically challenging images even with  echo contrast. Normal LVEF, no severe valve disease by Doppler.   Patient Profile     75 y.o. female with PMH of severe persistent asthma, HTN, CKD, DM II, OSA who presented with dyspnea.   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   Brantley Fling in June 2020 was normal.   - morbid obesity makes her a poor candidate for myoview or coronary CT. BMI 52.  - Echo 03/12/2021 showed 60-65%, no regional wall motion abnormality, mild LVH, grade 2 DD, elevated LVEDP, normal RV size, trivial MR, mild AI.   - urine output not very significant, only -370 ml since admission. Cr went up from 1.15 on 12/21 to 1.36 on 12/23  - physical exam shows diminished breath sound in bilateral lung, CXR showed cardiomegaly with mild interstitial edema, consider regular CT of chest wo contrast to rule out pulmonary etiology.   - unclear how much volume overloaded she is, difficult to tell based on physical exam. On prednisone for wheezing. LVEDP was elevated on echo. With 40mg  BID IV lasix, urine output was poor, Cr bumped up slightly in 2 days.   HTN: BP remain high, increase hydralazine to 50mg  BID for afterload reduction.       For questions or updates, please contact [place] Please consult [email or web address] for contact info under        Signed, Almyra Deforest, [place]  03/13/2021, 8:36 AM

## 2021-03-13 NOTE — Plan of Care (Signed)
°  Problem: Cardiac: Goal: Ability to achieve and maintain adequate cardiopulmonary perfusion will improve Outcome: Progressing   Problem: Education: Goal: Knowledge of General Education information will improve Description: Including pain rating scale, medication(s)/side effects and non-pharmacologic comfort measures Outcome: Progressing   Problem: Clinical Measurements: Goal: Respiratory complications will improve Outcome: Progressing

## 2021-03-13 NOTE — Progress Notes (Signed)
Heart Failure Navigator Progress Note  Assessed for Heart & Vascular TOC clinic readiness.  Patient does not meet criteria due to Pt declined HV TOC clinic appt upon DC from hospitalization. Pt educated on benefits of HV TOC. Pt education complete regarding HF patient booklet. Encouraged pt to increase physical activity as able. Pt states she wants to get better, but doesn't want to leave the hospital until she understands what the specific reason for hospialization and plan. Endorses fluid indiscretion. States she watches her salt intake, but has also gained 40+ lbs since her previous 6 month appt with Dr. Bettina Gavia. PT states she uses her walker to get to the bathroom, then sits in her chair watching TV, goes to kitchen to eat and drink, then back to recliner. Discouraged sedentary lifestyle. Encouraged fluid restrictions.    Navigator available for educational resources.  Pricilla Holm, MSN, RN Heart Failure Nurse Navigator [phone]

## 2021-03-13 NOTE — Progress Notes (Signed)
Mobility Specialist Progress Note:   03/13/21 1330  Mobility  Activity Ambulated in hall  Level of Assistance Standby assist, set-up cues, supervision of patient - no hands on  Assistive Device Front wheel walker  Distance Ambulated (ft) 60 ft  Mobility Ambulated with assistance in hallway  Mobility Response Tolerated well  Mobility performed by Mobility specialist  Bed Position Chair  $Mobility charge 1 Mobility   Pt received in chair willing to participate in mobility. No complaints of pain. Pt a little SOB when returning to the room. Left in chair with call bell in reach and all needs met.   [facility] Public librarian Phone [phone] Secondary Phone [phone]

## 2021-03-13 NOTE — Evaluation (Signed)
Physical Therapy Evaluation Patient Details Name: Donna Shaffer MRN: [id number] DOB: 1945-04-07 Today's Date: 03/13/2021  History of Present Illness  Pt is a 75 y.o. female who presented 03/11/21 with SOB. EKG shows NSR with no ischemic changes. PMH: breast cancer; chronic diastolic CHF; HTN; depression/anxiety; morbid obesity; OSA; psoriasis with psoriatic arthritis; and DM   Clinical Impression  Pt presents with condition above and deficits mentioned below, see PT Problem List. PTA, she was mod I, using furniture for support when mobilizing short distances in her home and a RW in the community. Pt lives alone in a 1-level house with a short threshold to enter/exit. Pt appears to be functioning close to her baseline, but does display deficits in balance and activity tolerance, resulting in her requiring intermittent 1 UE support to mobilize with extra time and min guard-supervision this date. She is at risk for falls, and she verbalized understanding of this and of PT recs and HEP handout provided by PT this session. Her SpO2 remained >/= 94% on 2L O2 during mobility this date. She uses 2-3L O2 at baseline. Recommending HHPT follow-up to reduce her risk for falls, improve her endurance, and address her possible R sided sciatica pain, but pt likely will decline it. Will continue to follow acutely.     Recommendations for follow up therapy are one component of a multi-disciplinary discharge planning process, led by the attending physician.  Recommendations may be updated based on patient status, additional functional criteria and insurance authorization.  Follow Up Recommendations Home health PT (likely to decline though)    Assistance Recommended at Discharge PRN  Functional Status Assessment Patient has had a recent decline in their functional status and demonstrates the ability to make significant improvements in function in a reasonable and predictable amount of time.  Equipment  Recommendations  None recommended by PT    Recommendations for Other Services       Precautions / Restrictions Precautions Precautions: Fall Precaution Comments: 2-3L O2 baseline Restrictions Weight Bearing Restrictions: No      Mobility  Bed Mobility Overal bed mobility: Modified Independent             General bed mobility comments: Pt able to transition supine > sit EOB with HOB elevated without assistance.    Transfers Overall transfer level: Needs assistance Equipment used: None Transfers: Sit to/from Stand Sit to Stand: Supervision           General transfer comment: Supervision for safety coming to stand from EOB and standard commode using grab bars in bathroom, no LOB.    Ambulation/Gait Ambulation/Gait assistance: Min guard;Supervision Gait Distance (Feet): 82 Feet (x2 bouts of ~82 ft > ~25 ft) Assistive device: None;1 person hand held assist Gait Pattern/deviations: Step-through pattern;Decreased stride length;Wide base of support Gait velocity: reduced Gait velocity interpretation: <1.31 ft/sec, indicative of household ambulator   General Gait Details: Pt with short stride, slow gait, and wide BOS (likely due to increased body habitus). Mild unsteadiness noted, but no LOB, with pt intermittently reaching out for furniture and wall rails for support. DOE 3/4, SpO2 >/= 94% on 3L  Stairs            Wheelchair Mobility    Modified Rankin (Stroke Patients Only)       Balance Overall balance assessment: Needs assistance Sitting-balance support: No upper extremity supported;Feet supported Sitting balance-Leahy Scale: Good Sitting balance - Comments: Unable to reach feet for donning socks, but reports this is normal baseline   Standing  balance support: Single extremity supported;No upper extremity supported;During functional activity Standing balance-Leahy Scale: Fair Standing balance comment: Able to ambulate without UE support but prefers 1  UE support for stability                             Pertinent Vitals/Pain Pain Assessment: 0-10 Pain Score: 3  Pain Location: throughout body, reports it's normal Pain Descriptors / Indicators: Aching;Sore Pain Intervention(s): Limited activity within patient's tolerance;Monitored during session;Repositioned    Home Living Family/patient expects to be discharged to:: Private residence Living Arrangements: Alone Available Help at Discharge: Family;Available PRN/intermittently Type of Home: House Home Access: Other (comment) (short threshold)       Home Layout: One level Home Equipment: Conservation officer, nature (2 wheels);Shower seat;Hand held shower head;Hospital bed Additional Comments: Uses 2-3L O2 baseline    Prior Function Prior Level of Function : Independent/Modified Independent;Driving             Mobility Comments: Does not use an AD inside the house, but does furniture surf often, except uses RW for tub transfers. Uses RW when in community. Reports no other falls in past 6 months other than 1x in ED this admission in which she slid off EOB needing to go to bathroom. Ambulates short household distances before fatigues at baseline ADLs Comments: Sits for showers     Hand Dominance   Dominant Hand: Right    Extremity/Trunk Assessment   Upper Extremity Assessment Upper Extremity Assessment: Defer to OT evaluation    Lower Extremity Assessment Lower Extremity Assessment: RLE deficits/detail RLE Deficits / Details: Numbness/pain down back of her leg intermittently with pain in lower back/R buttocks per pt; otherwise bil MMT of 4+ to 5 grossly and no other numbness/tingling bil RLE Sensation: decreased light touch (intermittently in posterior aspect) RLE Coordination: WNL    Cervical / Trunk Assessment Cervical / Trunk Assessment: Other exceptions Cervical / Trunk Exceptions: increased body habitus  Communication   Communication: No difficulties  Cognition  Arousal/Alertness: Awake/alert Behavior During Therapy: WFL for tasks assessed/performed Overall Cognitive Status: Within Functional Limits for tasks assessed                                          General Comments General comments (skin integrity, edema, etc.): DOE 3/4 with gait, SPO2 >/= 94% on 2L, HR up to 123; educated pt on her risk for falls and PT recs for HHPT, verbalized understanding; provided HEP handout consisting of Rhomberg and tandem stance at counter support for safety and sit to stands with/without UE support    Exercises     Assessment/Plan    PT Assessment Patient needs continued PT services  PT Problem List Decreased activity tolerance;Decreased balance;Decreased mobility;Decreased range of motion;Cardiopulmonary status limiting activity;Impaired sensation;Obesity       PT Treatment Interventions DME instruction;Gait training;Functional mobility training;Therapeutic activities;Balance training;Therapeutic exercise;Neuromuscular re-education;Patient/family education    PT Goals (Current goals can be found in the Care Plan section)  Acute Rehab PT Goals Patient Stated Goal: to go home PT Goal Formulation: With patient Time For Goal Achievement: 03/27/21 Potential to Achieve Goals: Good    Frequency Min 3X/week   Barriers to discharge        Co-evaluation               AM-PAC PT "6 Clicks" Mobility  Outcome  Measure Help needed turning from your back to your side while in a flat bed without using bedrails?: None Help needed moving from lying on your back to sitting on the side of a flat bed without using bedrails?: None Help needed moving to and from a bed to a chair (including a wheelchair)?: A Little Help needed standing up from a chair using your arms (e.g., wheelchair or bedside chair)?: A Little Help needed to walk in hospital room?: A Little Help needed climbing 3-5 steps with a railing? : A Little 6 Click Score: 20    End  of Session Equipment Utilized During Treatment: Oxygen Activity Tolerance: Patient tolerated treatment well;Patient limited by fatigue Patient left: in chair;with call bell/phone within reach;with chair alarm set Nurse Communication: Mobility status PT Visit Diagnosis: Unsteadiness on feet (R26.81);Other abnormalities of gait and mobility (R26.89);Difficulty in walking, not elsewhere classified (R26.2)    Time: 0962-8366 PT Time Calculation (min) (ACUTE ONLY): 34 min   Charges:   PT Evaluation $PT Eval Moderate Complexity: 1 Mod PT Treatments $Gait Training: 8-22 mins        Moishe Spice, PT, DPT Acute Rehabilitation Services  Pager: [phone] Office: [phone]   Orvan Falconer 03/13/2021, 9:00 AM

## 2021-03-13 NOTE — Progress Notes (Signed)
PROGRESS NOTE    Nupur Hohman  ZOX:[id number] DOB: October 18, 1945 DOA: 03/11/2021 PCP: Myrlene Broker, MD   Brief Narrative:  HPI: Milisa Kimbell is a 75 y.o. female with medical history significant of remote breast cancer; chronic diastolic CHF; HTN; depression/anxiety; morbid obesity; OSA; psoriasis with psoriatic arthritis; and DM presenting with SOB.  She saw Dr. Bettina Gavia on 12/20 for a checkup.  She was SOB and wheezing although she couldn't tell this was a problem.  They wanted to get a troponin level on her and gave her an rx for steroids.  She went out to eat, got the medicine and went home.  She felt really good later than night, "I just knew it was a miracle."  She got up and walked from bedroom to kitchen and she didn't feel like her heart was balling up "like I was just going to die" although she was still wheezing.  She was very pleased.  She went to bed and Dr. Bettina Gavia started calling and she didn't answer because she was asleep and then her daughter showed up and said she had to go to the hospital.  She didn't want to go but went anyway and has been there from 7pm to now.  She is normally on 3L and is on that now.  She feels pretty good currently.  She didn't fall at [facility] - was trying to go to the bedside commode and was sleepy and couldn't find her call bell and she slid off the bed.  She did not hurt herself.  She seems to get worse with walking - she wheezes and gets SOB and feels like her heart is being squeezed.  She sits down to recover and it takes a couple of minutes..  It is exhausting to do anything like take a shower. + LE edema. +weight gain, maybe 30 pounds over 2-3 weeks.  She has nebs/MDIs and she thinks she hasn't been very good with her meds and so hasn't taking them since she thought she wasn't that bad.  +PND x 3-4 months.  She saw GI because of food and pill dysphagia (rarely liquid) but she couldn't finish that evaluation because of these issues.  Assessment  & Plan:   Principal Problem:   Acute on chronic respiratory failure with hypoxia (HCC) Active Problems:   Breast cancer of upper-outer quadrant of left female breast (HCC)   OSA (obstructive sleep apnea)   Morbid obesity (HCC)   Type 2 diabetes mellitus with hyperglycemia, without long-term current use of insulin (HCC)   Hypertension   Yeast dermatitis   Acute on chronic heart failure with preserved ejection fraction (HFpEF) (HCC)   Obesity hypoventilation syndrome (HCC)  Acute on chronic respiratory failure with hypoxia secondary to combination of acute on chronic congestive heart failure with preserved ejection fraction, acute asthma exacerbation and OHS/OSA: -Patient has chronic respiratory failure and usually wears 3L [place] O2 -She has reported h/o asthma (no tobacco history). She also very likely has OHS in addition to OSA. CXR consistent with mild pulmonary edema. Mildly elevated BNP with known baseline.  Aldactone and Cozaar has been added to her regime.  She is on IV Lasix but has had poor diuresis.  Cardiology is also on board.  We are going to continue same management.  I do agree with cardiology note that most of her problems stem from obesity, OHS.  Continue prednisone   Obesity hypoventilation syndrome/OSA, POA: Patient does not like to hear CPAP although she is  advised to do so.  Also possibly she has OHS.  I have spent great length of time with her counseling her about weight loss and being compliant with CPAP.  Hypertension: Only slightly elevated.  Continue home dose of Coreg, hydralazine and now on Cozaar and Aldactone as well.   Type 2 diabetes mellitus with hyperglycemia, without long-term current use of insulin, POA: -Prior A1c was 6.2, not on medications, blood sugar controlled, continue SSI.   Morbid obesity:  Body mass index is 51.21 kg/m.  Standard counseling on weight loss.  DVT prophylaxis: enoxaparin (LOVENOX) injection 40 mg Start: 03/12/21 1745   Code Status:  Full Code  Family Communication:  None present at bedside.  Plan of care discussed with patient in length and he verbalized understanding and agreed with it.  Status is: Observation  The patient will require care spanning > 2 midnights and should be moved to inpatient because: Needs IV diuresis and steroids.  Estimated body mass index is 52.9 kg/m as calculated from the following:   Height as of this encounter: 5\' 2"  (1.575 m).   Weight as of this encounter: 131.2 kg.  Nutritional Assessment: Body mass index is 52.9 kg/m.Marland Kitchen Seen by dietician.  I agree with the assessment and plan as outlined below: Nutrition Status:  Skin Assessment: I have examined the patient's skin and I agree with the wound assessment as performed by the wound care RN as outlined below:    Consultants:  Cardiology  Procedures:  None  Antimicrobials:  Anti-infectives (From admission, onward)    None          Subjective: Seen and examined.  She states that her breathing is better but is still not back to baseline.  She tells me that her edema is at her baseline.  Objective: Vitals:   03/13/21 0219 03/13/21 0430 03/13/21 0814 03/13/21 0914  BP:  (!) 162/72 (!) 162/75 (!) 160/71  Pulse:  72 73 70  Resp:  18 (!) 22 16  Temp:  (!) 97.5 F (36.4 C) (!) 97.4 F (36.3 C)   TempSrc:  Oral Oral   SpO2: 99% 98% 98% 99%  Weight:  131.2 kg    Height:        Intake/Output Summary (Last 24 hours) at 03/13/2021 1425 Last data filed at 03/13/2021 1306 Gross per 24 hour  Intake 920 ml  Output 1850 ml  Net -930 ml   Filed Weights   03/11/21 1945 03/12/21 1538 03/13/21 0430  Weight: 134.7 kg 127 kg 131.2 kg    Examination:  General exam: Appears calm and comfortable, morbidly obese Respiratory system: Diminished breath sounds due to body habitus. Respiratory effort normal. Cardiovascular system: S1 & S2 heard, RRR. No JVD, murmurs, rubs, gallops or clicks.  +1 pitting edema bilateral lower  extremity. Gastrointestinal system: Abdomen is nondistended, soft and nontender. No organomegaly or masses felt. Normal bowel sounds heard. Central nervous system: Alert and oriented. No focal neurological deficits. Extremities: Symmetric 5 x 5 power. Skin: No rashes, lesions or ulcers Psychiatry: Judgement and insight appear normal. Mood & affect appropriate.    Data Reviewed: I have personally reviewed following labs and imaging studies  CBC: Recent Labs  Lab 03/10/21 1653 03/11/21 1950 03/13/21 0237  WBC 9.4 7.1 7.3  NEUTROABS  --  6.1  --   HGB 12.5 12.0 12.5  HCT 39.1 37.7 38.8  MCV 88 90.8 90.2  PLT 244 217 161   Basic Metabolic Panel: Recent Labs  Lab 03/10/21  [place] 03/11/21 1950 03/13/21 0237  NA 142 136 138  K 4.4 4.4 4.1  CL 97 94* 94*  CO2 32* 34* 37*  GLUCOSE 146* 216* 180*  BUN 27 27* 26*  CREATININE 1.13* 1.15* 1.36*  CALCIUM 9.8 9.1 9.2   GFR: Estimated Creatinine Clearance: 47.3 mL/min (A) (by C-G formula based on SCr of 1.36 mg/dL (H)). Liver Function Tests: Recent Labs  Lab 03/11/21 1950  AST 53*  ALT 49*  ALKPHOS 109  BILITOT 0.3  PROT 8.0  ALBUMIN 3.4*   No results for input(s): LIPASE, AMYLASE in the last 168 hours. No results for input(s): AMMONIA in the last 168 hours. Coagulation Profile: No results for input(s): INR, PROTIME in the last 168 hours. Cardiac Enzymes: No results for input(s): CKTOTAL, CKMB, CKMBINDEX, TROPONINI in the last 168 hours. BNP (last 3 results) Recent Labs    03/10/21 1653  PROBNP 286   HbA1C: Recent Labs    03/12/21 1818  HGBA1C 6.7*   CBG: Recent Labs  Lab 03/12/21 1422 03/12/21 2120 03/13/21 0636 03/13/21 1121  GLUCAP 143* 141* 161* 125*   Lipid Profile: No results for input(s): CHOL, HDL, LDLCALC, TRIG, CHOLHDL, LDLDIRECT in the last 72 hours. Thyroid Function Tests: No results for input(s): TSH, T4TOTAL, FREET4, T3FREE, THYROIDAB in the last 72 hours. Anemia Panel: No results for  input(s): VITAMINB12, FOLATE, FERRITIN, TIBC, IRON, RETICCTPCT in the last 72 hours. Sepsis Labs: No results for input(s): PROCALCITON, LATICACIDVEN in the last 168 hours.  Recent Results (from the past 240 hour(s))  Resp Panel by RT-PCR (Flu A&B, Covid) Nasopharyngeal Swab     Status: None   Collection Time: 03/12/21  4:39 AM   Specimen: Nasopharyngeal Swab; Nasopharyngeal(NP) swabs in vial transport medium  Result Value Ref Range Status   SARS Coronavirus 2 by RT PCR NEGATIVE NEGATIVE Final    Comment: (NOTE) SARS-CoV-2 target nucleic acids are NOT DETECTED.  The SARS-CoV-2 RNA is generally detectable in upper respiratory specimens during the acute phase of infection. The lowest concentration of SARS-CoV-2 viral copies this assay can detect is 138 copies/mL. A negative result does not preclude SARS-Cov-2 infection and should not be used as the sole basis for treatment or other patient management decisions. A negative result may occur with  improper specimen collection/handling, submission of specimen other than nasopharyngeal swab, presence of viral mutation(s) within the areas targeted by this assay, and inadequate number of viral copies(<138 copies/mL). A negative result must be combined with clinical observations, patient history, and epidemiological information. The expected result is Negative.  Fact Sheet for Patients:  EntrepreneurPulse.com.au  Fact Sheet for Healthcare Providers:  IncredibleEmployment.be  This test is no t yet approved or cleared by the [place] FDA and  has been authorized for detection and/or diagnosis of SARS-CoV-2 by FDA under an Emergency Use Authorization (EUA). This EUA will remain  in effect (meaning this test can be used) for the duration of the COVID-19 declaration under Section 564(b)(1) of the Act, 21 U.S.C.section 360bbb-3(b)(1), unless the authorization is terminated  or revoked sooner.        Influenza A by PCR NEGATIVE NEGATIVE Final   Influenza B by PCR NEGATIVE NEGATIVE Final    Comment: (NOTE) The Xpert Xpress SARS-CoV-2/FLU/RSV plus assay is intended as an aid in the diagnosis of influenza from Nasopharyngeal swab specimens and should not be used as a sole basis for treatment. Nasal washings and aspirates are unacceptable for Xpert Xpress SARS-CoV-2/FLU/RSV testing.  Fact Sheet for Patients:  EntrepreneurPulse.com.au  Fact Sheet for Healthcare Providers: IncredibleEmployment.be  This test is not yet approved or cleared by the [place] FDA and has been authorized for detection and/or diagnosis of SARS-CoV-2 by FDA under an Emergency Use Authorization (EUA). This EUA will remain in effect (meaning this test can be used) for the duration of the COVID-19 declaration under Section 564(b)(1) of the Act, 21 U.S.C. section 360bbb-3(b)(1), unless the authorization is terminated or revoked.  Performed at [facility], [address] [place], [place] [address]       Radiology Studies: DG Chest 2 View  Result Date: 03/11/2021 CLINICAL DATA:  Shortness of breath. EXAM: CHEST - 2 VIEW COMPARISON:  Chest x-ray 08/08/2020. FINDINGS: The heart is enlarged as seen on the prior study. There is central pulmonary vascular congestion. There are minimal strandy opacities in the lung bases. There is no pleural effusion or pneumothorax. Left axillary surgical clips are present. No acute fractures are identified. IMPRESSION: 1. Cardiomegaly with mild interstitial edema. Electronically Signed   By: Ronney Asters M.D.   On: 03/11/2021 20:24   ECHOCARDIOGRAM COMPLETE  Result Date: 03/12/2021    ECHOCARDIOGRAM REPORT   Patient Name:   [facility] MAE Kabler Date of Exam: 03/12/2021 Medical Rec #:  [id number]             Height:       62.0 in Accession #:    [id number]            Weight:       280.0 lb Date of Birth:  [birth year]              BSA:           2.206 m Patient Age:    12 years              BP:           152/75 mmHg Patient Gender: F                     HR:           74 bpm. Exam Location:  Inpatient Procedure: 2D Echo, Color Doppler, Cardiac Doppler and Intracardiac            Opacification Agent Indications:    CHF  History:        Patient has prior history of Echocardiogram examinations, most                 recent 03/28/2017. Risk Factors:Morbid obesity. Asthma. Elevated                 troponins.  Sonographer:    Merrie Roof RDCS Referring Phys: [id number] JENNIFER YATES  Sonographer Comments: Patient is morbidly obese and no subcostal window. IMPRESSIONS  1. Left ventricular ejection fraction, by estimation, is 60 to 65%. The left ventricle has normal function. The left ventricle has no regional wall motion abnormalities. The left ventricular internal cavity size was mildly dilated. There is mild concentric left ventricular hypertrophy. Left ventricular diastolic parameters are consistent with Grade II diastolic dysfunction (pseudonormalization). Elevated left ventricular end-diastolic pressure.  2. Right ventricular systolic function is normal. The right ventricular size is normal.  3. Left atrial size was mild to moderately dilated.  4. The mitral valve was not well visualized. Trivial mitral valve regurgitation. No evidence of mitral stenosis. Moderate mitral annular calcification.  5. The aortic valve was not well visualized. Aortic valve regurgitation is mild. Comparison(s):  No significant change from prior study. Conclusion(s)/Recommendation(s): Technically challenging images even with echo contrast. Normal LVEF, no severe valve disease by Doppler. FINDINGS  Left Ventricle: Left ventricular ejection fraction, by estimation, is 60 to 65%. The left ventricle has normal function. The left ventricle has no regional wall motion abnormalities. Definity contrast agent was given IV to delineate the left ventricular  endocardial borders. The left  ventricular internal cavity size was mildly dilated. There is mild concentric left ventricular hypertrophy. Left ventricular diastolic parameters are consistent with Grade II diastolic dysfunction (pseudonormalization). Elevated left ventricular end-diastolic pressure. Right Ventricle: The right ventricular size is normal. Right vetricular wall thickness was not well visualized. Right ventricular systolic function is normal. Left Atrium: Left atrial size was mild to moderately dilated. Right Atrium: Right atrial size was not well visualized. Pericardium: There is no evidence of pericardial effusion. Presence of epicardial fat layer. Mitral Valve: The mitral valve was not well visualized. Moderate mitral annular calcification. Trivial mitral valve regurgitation. No evidence of mitral valve stenosis. Tricuspid Valve: The tricuspid valve is not well visualized. Tricuspid valve regurgitation is trivial. No evidence of tricuspid stenosis. Aortic Valve: The aortic valve was not well visualized. Aortic valve regurgitation is mild. Aortic valve mean gradient measures 11.0 mmHg. Aortic valve peak gradient measures 19.4 mmHg. Aortic valve area, by VTI measures 1.65 cm. Pulmonic Valve: The pulmonic valve was not well visualized. Pulmonic valve regurgitation is not visualized. Aorta: The aortic root, ascending aorta and aortic arch are all structurally normal, with no evidence of dilitation or obstruction. Venous: The inferior vena cava was not well visualized. IAS/Shunts: The interatrial septum was not well visualized.  LEFT VENTRICLE PLAX 2D LVIDd:         5.60 cm   Diastology LVIDs:         3.40 cm   LV e' medial:    8.38 cm/s LV PW:         1.30 cm   LV E/e' medial:  16.3 LV IVS:        1.30 cm   LV e' lateral:   4.68 cm/s LVOT diam:     2.00 cm   LV E/e' lateral: 29.3 LV SV:         74 LV SV Index:   34 LVOT Area:     3.14 cm  RIGHT VENTRICLE RV Basal diam:  3.00 cm LEFT ATRIUM         Index       RIGHT ATRIUM            Index LA diam:    4.50 cm 2.04 cm/m  RA Area:     15.20 cm                                 RA Volume:   36.00 ml  16.32 ml/m  AORTIC VALVE AV Area (Vmax):    1.71 cm AV Area (Vmean):   1.63 cm AV Area (VTI):     1.65 cm AV Vmax:           220.00 cm/s AV Vmean:          155.000 cm/s AV VTI:            0.450 m AV Peak Grad:      19.4 mmHg AV Mean Grad:      11.0 mmHg LVOT Vmax:         120.00  cm/s LVOT Vmean:        80.400 cm/s LVOT VTI:          0.236 m LVOT/AV VTI ratio: 0.52  AORTA Ao Root diam: 3.20 cm Ao Asc diam:  2.80 cm MITRAL VALVE MV Area (PHT): 3.65 cm     SHUNTS MV Decel Time: 208 msec     Systemic VTI:  0.24 m MV E velocity: 137.00 cm/s  Systemic Diam: 2.00 cm MV A velocity: 96.50 cm/s MV E/A ratio:  1.42 Buford Dresser MD Electronically signed by Buford Dresser MD Signature Date/Time: 03/12/2021/8:10:06 PM    Final     Scheduled Meds:  carvedilol  6.25 mg Oral Daily   citalopram  40 mg Oral Daily   docusate sodium  100 mg Oral BID   enoxaparin (LOVENOX) injection  40 mg Subcutaneous Q24H   hydrALAZINE  50 mg Oral BID   insulin aspart  0-15 Units Subcutaneous TID WC   insulin aspart  0-5 Units Subcutaneous QHS   ipratropium  0.5 mg Nebulization Q6H   [START ON 03/14/2021] losartan  75 mg Oral Daily   nystatin  1 application Topical TID   pantoprazole  40 mg Oral Daily   predniSONE  40 mg Oral Q breakfast   sodium chloride flush  3 mL Intravenous Q12H   spironolactone  12.5 mg Oral Daily   Continuous Infusions:   LOS: 0 days   Time spent: 38 minutes   Darliss Cheney, MD [facility]  03/13/2021, 2:25 PM  Please page via Amion and do not message via secure chat for anything urgent. Secure chat can be used for anything non urgent.  How to contact the [facility] Attending or Consulting provider [place] or covering provider during after hours [place], for this patient?  Check the care team in [facility] and look for a) attending/consulting TRH provider listed and b)  the [facility] team listed. Page or secure chat [phone]. Log into [email or web address] and use [place]'s universal password to access. If you do not have the password, please contact the hospital operator. Locate the [facility] provider you are looking for under [facility] and page to a number that you can be directly reached. If you still have difficulty reaching the provider, please page the [facility] (Director on Call) for the Hospitalists listed on amion for assistance.

## 2021-03-13 NOTE — TOC Progression Note (Signed)
Transition of Care [facility]) - Progression Note    Patient Details  Name: Donna Shaffer MRN: [id number] Date of Birth: Jun 15, 1945  Transition of Care [facility]) CM/SW Contact  Zenon Mayo, RN Phone Number: 03/13/2021, 6:11 PM  Clinical Narrative:     from home, CHF, COPD, conts on iv lasix, hydralazine.  TOC will continue to follow for dc needs.        Expected Discharge Plan and Services                                                 Social Determinants of Health (SDOH) Interventions    Readmission Risk Interventions No flowsheet data found.

## 2021-03-13 NOTE — Progress Notes (Signed)
Inpatient Diabetes Program Recommendations  AACE/ADA: New Consensus Statement on Inpatient Glycemic Control (2015)  Target Ranges:  Prepandial:   less than 140 mg/dL      Peak postprandial:   less than 180 mg/dL (1-2 hours)      Critically ill patients:  140 - 180 mg/dL   Lab Results  Component Value Date   GLUCAP 161 (H) 03/13/2021   HGBA1C 6.7 (H) 03/12/2021    Review of Glycemic Control  Diabetes history: Diabetes 2 Outpatient Diabetes medications: None Current orders for Inpatient glycemic control:  Novolog 0-15 units tid + hs  PO prednisone 40 mg Daily  Consult for DM Glucose trends in 100 range.  A1c 6.7% this admission  Watch on current regimen. No interventions at this time. Follow up with PCP.  Thanks,  Tama Headings RN, MSN, BC-ADM Inpatient Diabetes Coordinator Team Pager [phone] (8a-5p)

## 2021-03-13 NOTE — Progress Notes (Signed)
Nutrition Brief Note  RD consulted to evaluate nutrition goals.  Wt Readings from Last 15 Encounters:  03/13/21 131.2 kg  03/10/21 134.1 kg  08/21/20 122.3 kg  01/02/20 116.9 kg  11/12/19 117.9 kg  10/02/19 118.6 kg  05/18/19 119.7 kg  02/19/19 119.3 kg  11/14/18 127.5 kg  10/22/18 127.9 kg  10/10/18 126.6 kg  10/03/18 (!) 137.9 kg  09/12/18 (!) 136.6 kg  08/24/18 130.6 kg  05/29/18 131.5 kg   RD working remotely. Attempted to call patient's room x3 - busy signal all three attempts.  Pt eating 100% of meals per Epic.  Reported no appetite changes in H&P and MST screening.  Weight has increased likely due to uncontrolled CHF.  Body mass index is 52.9 kg/m.  Current diet order is Heart Healthy, patient is consuming approximately 100% of meals at this time. Labs and medications reviewed.   No nutrition interventions warranted at this time. If nutrition issues arise, please consult RD.   Derrel Nip, RD, LDN (she/her/hers) Clinical Inpatient Dietitian RD Pager/After-Hours/Weekend Pager # in [place]

## 2021-03-13 NOTE — Evaluation (Signed)
Occupational Therapy Evaluation Patient Details Name: Donna Shaffer MRN: [id number] DOB: 24-Jun-1945 Today's Date: 03/13/2021   History of Present Illness Pt is a 75 y.o. female who presented 03/11/21 with SOB. EKG shows NSR with no ischemic changes. PMH: breast cancer; chronic diastolic CHF; HTN; depression/anxiety; morbid obesity; OSA; psoriasis with psoriatic arthritis; and DM   Clinical Impression   PT admitted for concerns listed above. PTA pt reported that she was mainly independent with ADL's and functional mobility, however she requires increased assist for all IADL's. At this time, pt presents with decreased activity tolerance, weakness, and mild balance concerns. She requires frequent rest breaks due to SOB and weakness, as well as assist with all LB ADL's due to difficulty reaching down and quickly fatiguing with those tasks. Recommending HHOT to maximize her independence and safety at home. OT will continue to follow acutely.       Recommendations for follow up therapy are one component of a multi-disciplinary discharge planning process, led by the attending physician.  Recommendations may be updated based on patient status, additional functional criteria and insurance authorization.   Follow Up Recommendations  Home health OT    Assistance Recommended at Discharge Set up Supervision/Assistance  Functional Status Assessment  Patient has had a recent decline in their functional status and demonstrates the ability to make significant improvements in function in a reasonable and predictable amount of time.  Equipment Recommendations  BSC/3in1    Recommendations for Other Services       Precautions / Restrictions Precautions Precautions: Fall Precaution Comments: 2-3L O2 baseline Restrictions Weight Bearing Restrictions: No      Mobility Bed Mobility Overal bed mobility: Modified Independent             General bed mobility comments: Pt up in recliner on  entry.    Transfers Overall transfer level: Needs assistance Equipment used: None Transfers: Sit to/from Stand Sit to Stand: Supervision           General transfer comment: Supervision for safety coming to stand, no LoB      Balance Overall balance assessment: Needs assistance Sitting-balance support: No upper extremity supported;Feet supported Sitting balance-Leahy Scale: Good Sitting balance - Comments: Unable to reach feet for donning socks, but reports this is normal baseline   Standing balance support: Single extremity supported;No upper extremity supported;During functional activity Standing balance-Leahy Scale: Fair Standing balance comment: Able to ambulate without UE support but prefers 1 UE support for stability                           ADL either performed or assessed with clinical judgement   ADL Overall ADL's : Needs assistance/impaired Eating/Feeding: Set up;Sitting   Grooming: Set up;Sitting   Upper Body Bathing: Set up;Sitting   Lower Body Bathing: Minimal assistance;Sitting/lateral leans;Sit to/from stand   Upper Body Dressing : Set up;Sitting   Lower Body Dressing: Minimal assistance;Sitting/lateral leans;Sit to/from stand   Toilet Transfer: Min guard;Ambulation   Toileting- Clothing Manipulation and Hygiene: Minimal assistance;Sitting/lateral lean;Sit to/from stand       Functional mobility during ADLs: Min guard General ADL Comments: Pt with decreased activity tolerance, requiring increased time and frequent rest breaks. She also has difficulty with LB ADL's due to SOB and difficulty reaching down towards her feet.     Vision Baseline Vision/History: 0 No visual deficits Ability to See in Adequate Light: 0 Adequate Patient Visual Report: No change from baseline Vision Assessment?: No  apparent visual deficits     Perception     Praxis      Pertinent Vitals/Pain Pain Assessment: 0-10 Pain Score: 3  Pain Location:  throughout body, reports it's normal Pain Descriptors / Indicators: Aching;Sore Pain Intervention(s): Limited activity within patient's tolerance;Monitored during session;Repositioned     Hand Dominance Right   Extremity/Trunk Assessment Upper Extremity Assessment Upper Extremity Assessment: Generalized weakness   Lower Extremity Assessment Lower Extremity Assessment: Defer to PT evaluation RLE Deficits / Details: Numbness/pain down back of her leg intermittently with pain in lower back/R buttocks per pt; otherwise bil MMT of 4+ to 5 grossly and no other numbness/tingling bil RLE Sensation: decreased light touch (intermittently in posterior aspect) RLE Coordination: WNL   Cervical / Trunk Assessment Cervical / Trunk Assessment: Other exceptions Cervical / Trunk Exceptions: increased body habitus   Communication Communication Communication: No difficulties   Cognition Arousal/Alertness: Awake/alert Behavior During Therapy: WFL for tasks assessed/performed Overall Cognitive Status: Within Functional Limits for tasks assessed                                       General Comments  Sob/DOE 3/4 with short ambulation, O2 remaining above 93% on 2L, HR up to 125 with ambulation.    Exercises     Shoulder Instructions      Home Living Family/patient expects to be discharged to:: Private residence Living Arrangements: Alone Available Help at Discharge: Family;Available PRN/intermittently Type of Home: House Home Access: Other (comment) (short threshold)     Home Layout: One level     Bathroom Shower/Tub: Teacher, early years/pre: Standard     Home Equipment: Conservation officer, nature (2 wheels);Shower seat;Hand held shower head;Hospital bed   Additional Comments: Uses 2-3L O2 baseline      Prior Functioning/Environment Prior Level of Function : Independent/Modified Independent;Driving             Mobility Comments: Does not use an AD inside the  house, but does furniture surf often, except uses RW for tub transfers. Uses RW when in community. Reports no other falls in past 6 months other than 1x in ED this admission in which she slid off EOB needing to go to bathroom. Ambulates short household distances before fatigues at baseline ADLs Comments: Sits for showers        OT Problem List: Decreased strength;Decreased activity tolerance;Impaired balance (sitting and/or standing);Cardiopulmonary status limiting activity;Obesity      OT Treatment/Interventions: Self-care/ADL training;Therapeutic exercise;Energy conservation;DME and/or AE instruction;Therapeutic activities;Patient/family education;Balance training    OT Goals(Current goals can be found in the care plan section) Acute Rehab OT Goals Patient Stated Goal: To find out whats wrong with her. OT Goal Formulation: With patient Time For Goal Achievement: 03/27/21 Potential to Achieve Goals: Good ADL Goals Pt Will Perform Lower Body Bathing: with set-up;with adaptive equipment;sitting/lateral leans;sit to/from stand Pt Will Perform Lower Body Dressing: with set-up;sitting/lateral leans;sit to/from stand;with adaptive equipment Pt Will Transfer to Toilet: with modified independence;ambulating Pt Will Perform Toileting - Clothing Manipulation and hygiene: with modified independence;sitting/lateral leans;sit to/from stand;with adaptive equipment Additional ADL Goal #1: Pt will verbalize 3 energy conservation techniques that she can use at home.  OT Frequency: Min 2X/week   Barriers to D/C:            Co-evaluation              AM-PAC OT "6 Clicks" Daily  Activity     Outcome Measure Help from another person eating meals?: A Little Help from another person taking care of personal grooming?: A Little Help from another person toileting, which includes using toliet, bedpan, or urinal?: A Little Help from another person bathing (including washing, rinsing, drying)?: A  Little Help from another person to put on and taking off regular upper body clothing?: A Little Help from another person to put on and taking off regular lower body clothing?: A Little 6 Click Score: 18   End of Session Equipment Utilized During Treatment: Oxygen Nurse Communication: Mobility status  Activity Tolerance: Patient tolerated treatment well Patient left: in chair;with call bell/phone within reach;with chair alarm set  OT Visit Diagnosis: Unsteadiness on feet (R26.81);Other abnormalities of gait and mobility (R26.89);Muscle weakness (generalized) (M62.81)                Time: 5465-0354 OT Time Calculation (min): 25 min Charges:  OT General Charges $OT Visit: 1 Visit OT Evaluation $OT Eval Moderate Complexity: 1 Mod OT Treatments $Self Care/Home Management : 8-22 mins  Jaymee Tilson H., OTR/L Acute Rehabilitation  Jahnae Mcadoo Elane Yolanda Bonine 03/13/2021, 9:37 AM

## 2021-03-14 DIAGNOSIS — I10 Essential (primary) hypertension: Secondary | ICD-10-CM | POA: Diagnosis not present

## 2021-03-14 DIAGNOSIS — J9621 Acute and chronic respiratory failure with hypoxia: Secondary | ICD-10-CM | POA: Diagnosis not present

## 2021-03-14 DIAGNOSIS — R079 Chest pain, unspecified: Secondary | ICD-10-CM | POA: Diagnosis not present

## 2021-03-14 LAB — GLUCOSE, CAPILLARY
Glucose-Capillary: 116 mg/dL — ABNORMAL HIGH (ref 70–99)
Glucose-Capillary: 157 mg/dL — ABNORMAL HIGH (ref 70–99)
Glucose-Capillary: 204 mg/dL — ABNORMAL HIGH (ref 70–99)
Glucose-Capillary: 164 mg/dL — ABNORMAL HIGH (ref 70–99)

## 2021-03-14 LAB — BASIC METABOLIC PANEL WITH GFR
Anion gap: 8 (ref 5–15)
BUN: 29 mg/dL — ABNORMAL HIGH (ref 8–23)
CO2: 36 mmol/L — ABNORMAL HIGH (ref 22–32)
Calcium: 8.7 mg/dL — ABNORMAL LOW (ref 8.9–10.3)
Chloride: 93 mmol/L — ABNORMAL LOW (ref 98–111)
Creatinine, Ser: 1.19 mg/dL — ABNORMAL HIGH (ref 0.44–1.00)
GFR, Estimated: 48 mL/min — ABNORMAL LOW
Glucose, Bld: 132 mg/dL — ABNORMAL HIGH (ref 70–99)
Potassium: 3.4 mmol/L — ABNORMAL LOW (ref 3.5–5.1)
Sodium: 137 mmol/L (ref 135–145)

## 2021-03-14 LAB — TROPONIN I (HIGH SENSITIVITY): Troponin I (High Sensitivity): 16 ng/L

## 2021-03-14 MED ORDER — TORSEMIDE 20 MG PO TABS
20.0000 mg | ORAL_TABLET | Freq: Every day | ORAL | Status: DC
  Administered 2021-03-14: 11:00:00 20 mg via ORAL
  Filled 2021-03-14: qty 1

## 2021-03-14 MED ORDER — TORSEMIDE 20 MG PO TABS
20.0000 mg | ORAL_TABLET | Freq: Two times a day (BID) | ORAL | Status: DC
  Administered 2021-03-14 – 2021-03-15 (×2): 20 mg via ORAL
  Filled 2021-03-14 (×2): qty 1

## 2021-03-14 MED ORDER — ALPRAZOLAM 0.25 MG PO TABS
0.2500 mg | ORAL_TABLET | Freq: Three times a day (TID) | ORAL | Status: DC | PRN
  Administered 2021-03-14 – 2021-03-15 (×3): 0.25 mg via ORAL
  Filled 2021-03-14 (×3): qty 1

## 2021-03-14 MED ORDER — POTASSIUM CHLORIDE CRYS ER 20 MEQ PO TBCR
40.0000 meq | EXTENDED_RELEASE_TABLET | Freq: Once | ORAL | Status: AC
  Administered 2021-03-14: 11:00:00 40 meq via ORAL
  Filled 2021-03-14: qty 2

## 2021-03-14 MED ORDER — IPRATROPIUM BROMIDE 0.02 % IN SOLN: 0.5000 mg | Freq: Four times a day (QID) | RESPIRATORY_TRACT | Status: DC | PRN

## 2021-03-14 NOTE — Progress Notes (Addendum)
Progress Note  Patient Name: Donna Shaffer Date of Encounter: 03/14/2021  [facility] Cardiologist: Shirlee More, MD    Subjective   Net -390 cc yesterday, though with unmeasured urine output.  Weight is stable at 209 pounds, down 8 pounds from admission.  Creatinine improved today (1.36 > 1.19).  BP 157/77 today.  She reports intermittent chest pain and dyspnea.  Inpatient Medications    Scheduled Meds:  carvedilol  6.25 mg Oral Daily   citalopram  40 mg Oral Daily   docusate sodium  100 mg Oral BID   enoxaparin (LOVENOX) injection  40 mg Subcutaneous Q24H   hydrALAZINE  50 mg Oral BID   insulin aspart  0-15 Units Subcutaneous TID WC   insulin aspart  0-5 Units Subcutaneous QHS   ipratropium  0.5 mg Nebulization Q6H   losartan  75 mg Oral Daily   nystatin  1 application Topical TID   pantoprazole  40 mg Oral Daily   potassium chloride  40 mEq Oral Once   predniSONE  40 mg Oral Q breakfast   sodium chloride flush  3 mL Intravenous Q12H   spironolactone  12.5 mg Oral Daily   torsemide  20 mg Oral Daily   Continuous Infusions:  PRN Meds: acetaminophen **OR** acetaminophen, albuterol, bisacodyl, guaiFENesin-dextromethorphan, hydrALAZINE, LORazepam, morphine injection, ondansetron **OR** ondansetron (ZOFRAN) IV, oxyCODONE, polyethylene glycol   Vital Signs    Vitals:   03/14/21 0146 03/14/21 0529 03/14/21 0649 03/14/21 0730  BP:  (!) 172/81 (!) 157/77   Pulse:  75 66   Resp:  15 17   Temp:  97.7 F (36.5 C) 97.7 F (36.5 C)   TempSrc:  Oral Oral   SpO2: 93% 100% 100% 98%  Weight:      Height:        Intake/Output Summary (Last 24 hours) at 03/14/2021 0836 Last data filed at 03/14/2021 0500 Gross per 24 hour  Intake 1070 ml  Output 1700 ml  Net -630 ml    Last 3 Weights 03/14/2021 03/13/2021 03/12/2021  Weight (lbs) 289 lb 6.4 oz 289 lb 3.2 oz 280 lb  Weight (kg) 131.271 kg 131.18 kg 127.007 kg      Telemetry    NSR 60-70s. - Personally  Reviewed  ECG    No new ECG - Personally Reviewed  Physical Exam   GEN: No acute distress.   Neck: No JVD appreciated difficult to assess given habitus Cardiac: RRR, no murmurs, rubs, or gallops.  Respiratory: Clear to auscultation bilaterally. GI: Soft, nontender, non-distended  MS: No edema; No deformity. Neuro:  Nonfocal  Psych: Normal affect   Labs    High Sensitivity Troponin:   Recent Labs  Lab 03/11/21 1950 03/11/21 2343  TROPONINIHS 10 11      Chemistry Recent Labs  Lab 03/11/21 1950 03/13/21 0237 03/14/21 0449  NA 136 138 137  K 4.4 4.1 3.4*  CL 94* 94* 93*  CO2 34* 37* 36*  GLUCOSE 216* 180* 132*  BUN 27* 26* 29*  CREATININE 1.15* 1.36* 1.19*  CALCIUM 9.1 9.2 8.7*  PROT 8.0  --   --   ALBUMIN 3.4*  --   --   AST 53*  --   --   ALT 49*  --   --   ALKPHOS 109  --   --   BILITOT 0.3  --   --   GFRNONAA 50* 41* 48*  ANIONGAP 8 7 8      Lipids No results  for input(s): CHOL, TRIG, HDL, LABVLDL, LDLCALC, CHOLHDL in the last 168 hours.  Hematology Recent Labs  Lab 03/10/21 1653 03/11/21 1950 03/13/21 0237  WBC 9.4 7.1 7.3  RBC 4.47 4.15 4.30  HGB 12.5 12.0 12.5  HCT 39.1 37.7 38.8  MCV 88 90.8 90.2  MCH 28.0 28.9 29.1  MCHC 32.0 31.8 32.2  RDW 12.6 13.2 13.3  PLT 244 217 249    Thyroid No results for input(s): TSH, FREET4 in the last 168 hours.  BNP Recent Labs  Lab 03/10/21 1653 03/11/21 2352  BNP  --  145.0*  PROBNP 286  --      DDimer No results for input(s): DDIMER in the last 168 hours.   Radiology    ECHOCARDIOGRAM COMPLETE  Result Date: 03/12/2021    ECHOCARDIOGRAM REPORT   Patient Name:   Donna Shaffer Date of Exam: 03/12/2021 Medical Rec #:  [id number]             Height:       62.0 in Accession #:    [id number]            Weight:       280.0 lb Date of Birth:  [birth year]              BSA:          2.206 m Patient Age:    75 years              BP:           152/75 mmHg Patient Gender: F                     HR:            74 bpm. Exam Location:  Inpatient Procedure: 2D Echo, Color Doppler, Cardiac Doppler and Intracardiac            Opacification Agent Indications:    CHF  History:        Patient has prior history of Echocardiogram examinations, most                 recent 03/28/2017. Risk Factors:Morbid obesity. Asthma. Elevated                 troponins.  Sonographer:    Merrie Roof RDCS Referring Phys: [id number] JENNIFER YATES  Sonographer Comments: Patient is morbidly obese and no subcostal window. IMPRESSIONS  1. Left ventricular ejection fraction, by estimation, is 60 to 65%. The left ventricle has normal function. The left ventricle has no regional wall motion abnormalities. The left ventricular internal cavity size was mildly dilated. There is mild concentric left ventricular hypertrophy. Left ventricular diastolic parameters are consistent with Grade II diastolic dysfunction (pseudonormalization). Elevated left ventricular end-diastolic pressure.  2. Right ventricular systolic function is normal. The right ventricular size is normal.  3. Left atrial size was mild to moderately dilated.  4. The mitral valve was not well visualized. Trivial mitral valve regurgitation. No evidence of mitral stenosis. Moderate mitral annular calcification.  5. The aortic valve was not well visualized. Aortic valve regurgitation is mild. Comparison(s): No significant change from prior study. Conclusion(s)/Recommendation(s): Technically challenging images even with echo contrast. Normal LVEF, no severe valve disease by Doppler. FINDINGS  Left Ventricle: Left ventricular ejection fraction, by estimation, is 60 to 65%. The left ventricle has normal function. The left ventricle has no regional wall motion abnormalities. Definity contrast agent was given IV to delineate the left ventricular  endocardial borders. The left ventricular internal cavity size was mildly dilated. There is mild concentric left ventricular hypertrophy. Left ventricular diastolic  parameters are consistent with Grade II diastolic dysfunction (pseudonormalization). Elevated left ventricular end-diastolic pressure. Right Ventricle: The right ventricular size is normal. Right vetricular wall thickness was not well visualized. Right ventricular systolic function is normal. Left Atrium: Left atrial size was mild to moderately dilated. Right Atrium: Right atrial size was not well visualized. Pericardium: There is no evidence of pericardial effusion. Presence of epicardial fat layer. Mitral Valve: The mitral valve was not well visualized. Moderate mitral annular calcification. Trivial mitral valve regurgitation. No evidence of mitral valve stenosis. Tricuspid Valve: The tricuspid valve is not well visualized. Tricuspid valve regurgitation is trivial. No evidence of tricuspid stenosis. Aortic Valve: The aortic valve was not well visualized. Aortic valve regurgitation is mild. Aortic valve mean gradient measures 11.0 mmHg. Aortic valve peak gradient measures 19.4 mmHg. Aortic valve area, by VTI measures 1.65 cm. Pulmonic Valve: The pulmonic valve was not well visualized. Pulmonic valve regurgitation is not visualized. Aorta: The aortic root, ascending aorta and aortic arch are all structurally normal, with no evidence of dilitation or obstruction. Venous: The inferior vena cava was not well visualized. IAS/Shunts: The interatrial septum was not well visualized.  LEFT VENTRICLE PLAX 2D LVIDd:         5.60 cm   Diastology LVIDs:         3.40 cm   LV e' medial:    8.38 cm/s LV PW:         1.30 cm   LV E/e' medial:  16.3 LV IVS:        1.30 cm   LV e' lateral:   4.68 cm/s LVOT diam:     2.00 cm   LV E/e' lateral: 29.3 LV SV:         74 LV SV Index:   34 LVOT Area:     3.14 cm  RIGHT VENTRICLE RV Basal diam:  3.00 cm LEFT ATRIUM         Index       RIGHT ATRIUM           Index LA diam:    4.50 cm 2.04 cm/m  RA Area:     15.20 cm                                 RA Volume:   36.00 ml  16.32 ml/m  AORTIC  VALVE AV Area (Vmax):    1.71 cm AV Area (Vmean):   1.63 cm AV Area (VTI):     1.65 cm AV Vmax:           220.00 cm/s AV Vmean:          155.000 cm/s AV VTI:            0.450 m AV Peak Grad:      19.4 mmHg AV Mean Grad:      11.0 mmHg LVOT Vmax:         120.00 cm/s LVOT Vmean:        80.400 cm/s LVOT VTI:          0.236 m LVOT/AV VTI ratio: 0.52  AORTA Ao Root diam: 3.20 cm Ao Asc diam:  2.80 cm MITRAL VALVE MV Area (PHT): 3.65 cm     SHUNTS MV Decel Time: 208 msec  Systemic VTI:  0.24 m MV E velocity: 137.00 cm/s  Systemic Diam: 2.00 cm MV A velocity: 96.50 cm/s MV E/A ratio:  1.42 Buford Dresser MD Electronically signed by Buford Dresser MD Signature Date/Time: 03/12/2021/8:10:06 PM    Final     Cardiac Studies   Echo 03/12/2021 1. Left ventricular ejection fraction, by estimation, is 60 to 65%. The  left ventricle has normal function. The left ventricle has no regional  wall motion abnormalities. The left ventricular internal cavity size was  mildly dilated. There is mild  concentric left ventricular hypertrophy. Left ventricular diastolic  parameters are consistent with Grade II diastolic dysfunction  (pseudonormalization). Elevated left ventricular end-diastolic pressure.   2. Right ventricular systolic function is normal. The right ventricular  size is normal.   3. Left atrial size was mild to moderately dilated.   4. The mitral valve was not well visualized. Trivial mitral valve  regurgitation. No evidence of mitral stenosis. Moderate mitral annular  calcification.   5. The aortic valve was not well visualized. Aortic valve regurgitation  is mild.   Comparison(s): No significant change from prior study.   Conclusion(s)/Recommendation(s): Technically challenging images even with  echo contrast. Normal LVEF, no severe valve disease by Doppler.   Patient Profile     75 y.o. female with PMH of severe persistent asthma, HTN, CKD, DM II, OSA who presented with  dyspnea.   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   Brantley Fling in June 2020 was normal.   - morbid obesity makes her a poor candidate for myoview or coronary CT. BMI 52.  - Echo 03/12/2021 showed 60-65%, no regional wall motion abnormality, mild LVH, grade 2 DD, elevated LVEDP, normal RV size, trivial MR, mild AI.   -Given continuous chest pain with no troponin bump, no evidence of acute coronary syndrome.  Did report further chest pain overnight that lasted for hours, will repeat troponin. If negative, can follow-up with cardiology as outpatient to consider ischemia evaluation  - unclear how much volume overloaded she is, difficult to tell based on physical exam. On prednisone for wheezing. With 40mg  BID IV lasix, urine output was poor, Cr bumped up slightly in 2 days.  Improved today with holding IV diuresis.  Switch to PO torsemide today  HTN: BP remain high, increased hydralazine to 50mg  BID for afterload reduction.  BP remains elevated.  Losartan increased to 75 mg daily.        For questions or updates, please contact [place] Please consult [email or web address] for contact info under        Signed, Donato Heinz, MD  03/14/2021, 8:36 AM

## 2021-03-14 NOTE — Plan of Care (Signed)
Problem: Clinical Measurements: °Goal: Will remain free from infection °Outcome: Completed/Met °  °

## 2021-03-14 NOTE — Progress Notes (Signed)
PROGRESS NOTE    Donna Shaffer  ZTI:[id number] DOB: 1945/05/01 DOA: 03/11/2021 PCP: Myrlene Broker, MD   Brief Narrative:  HPI: Donna Shaffer is a 75 y.o. female with medical history significant of remote breast cancer; chronic diastolic CHF; HTN; depression/anxiety; morbid obesity; OSA; psoriasis with psoriatic arthritis; and DM presenting with SOB.  She saw Dr. Bettina Gavia on 12/20 for a checkup.  She was SOB and wheezing although she couldn't tell this was a problem.  They wanted to get a troponin level on her and gave her an rx for steroids.  She went out to eat, got the medicine and went home.  She felt really good later than night, "I just knew it was a miracle."  She got up and walked from bedroom to kitchen and she didn't feel like her heart was balling up "like I was just going to die" although she was still wheezing.  She was very pleased.  She went to bed and Dr. Bettina Gavia started calling and she didn't answer because she was asleep and then her daughter showed up and said she had to go to the hospital.  She didn't want to go but went anyway and has been there from 7pm to now.  She is normally on 3L and is on that now.  She feels pretty good currently.  She didn't fall at [facility] - was trying to go to the bedside commode and was sleepy and couldn't find her call bell and she slid off the bed.  She did not hurt herself.  She seems to get worse with walking - she wheezes and gets SOB and feels like her heart is being squeezed.  She sits down to recover and it takes a couple of minutes..  It is exhausting to do anything like take a shower. + LE edema. +weight gain, maybe 30 pounds over 2-3 weeks.  She has nebs/MDIs and she thinks she hasn't been very good with her meds and so hasn't taking them since she thought she wasn't that bad.  +PND x 3-4 months.  She saw GI because of food and pill dysphagia (rarely liquid) but she couldn't finish that evaluation because of these issues.  Assessment  & Plan:   Principal Problem:   Acute on chronic respiratory failure with hypoxia (HCC) Active Problems:   Breast cancer of upper-outer quadrant of left female breast (HCC)   Chest pain of uncertain etiology   OSA (obstructive sleep apnea)   Morbid obesity (HCC)   Type 2 diabetes mellitus with hyperglycemia, without long-term current use of insulin (HCC)   Hypertension   Yeast dermatitis   Acute on chronic heart failure with preserved ejection fraction (HFpEF) (HCC)   Obesity hypoventilation syndrome (HCC)  Acute on chronic respiratory failure with hypoxia secondary to combination of acute on chronic congestive heart failure with preserved ejection fraction, acute asthma exacerbation and OHS/OSA: -Patient has chronic respiratory failure and usually wears 3L [place] O2 -She has reported h/o asthma (no tobacco history). She also very likely has OHS in addition to OSA. CXR consistent with mild pulmonary edema. Mildly elevated BNP with known baseline.  Aldactone and Cozaar has been added to her regime. Cardiology is also on board.  They discontinued her IV Lasix yesterday.  We have resumed her home dose of torsemide however patient still has crackles on exam and +2 pitting edema bilaterally.  Discussed with cardiology, they are okay with increasing the dose of her home dose of torsemide from 20 mg  once daily to 20 mg twice daily.  We will also continue prednisone for total of 5 days.   Obesity hypoventilation syndrome/OSA, POA: Patient does not like to wear CPAP although she is advised to do so.  Also possibly she has OHS.  I have spent great length of time with her counseling her about weight loss and being compliant with CPAP.  Hypertension: Only slightly elevated.  Continue home dose of Coreg, hydralazine.  Aldactone added.  Cozaar increased by cardiology today.   Type 2 diabetes mellitus with hyperglycemia, without long-term current use of insulin, POA: -Prior A1c was 6.2, not on medications, blood  sugar controlled, continue SSI.   Morbid obesity:  Body mass index is 51.21 kg/m.  Extensive counseling on weight loss.  She seems committed.  DVT prophylaxis: enoxaparin (LOVENOX) injection 40 mg Start: 03/12/21 1745   Code Status: Full Code  Family Communication:  None present at bedside.  Plan of care discussed with patient in length and he verbalized understanding and agreed with it.  Status is: Observation  The patient will require care spanning > 2 midnights and should be moved to inpatient because: Needs IV diuresis and steroids.  Estimated body mass index is 52.93 kg/m as calculated from the following:   Height as of this encounter: 5\' 2"  (1.575 m).   Weight as of this encounter: 131.3 kg.  Nutritional Assessment: Body mass index is 52.93 kg/m.Marland Kitchen Seen by dietician.  I agree with the assessment and plan as outlined below: Nutrition Status:  Skin Assessment: I have examined the patient's skin and I agree with the wound assessment as performed by the wound care RN as outlined below:    Consultants:  Cardiology  Procedures:  None  Antimicrobials:  Anti-infectives (From admission, onward)    None          Subjective:  Patient seen and examined.  Still has some shortness of breath but improving. Objective: Vitals:   03/14/21 0529 03/14/21 0649 03/14/21 0730 03/14/21 1055  BP: (!) 172/81 (!) 157/77    Pulse: 75 66  65  Resp: 15 17    Temp: 97.7 F (36.5 C) 97.7 F (36.5 C)  97.9 F (36.6 C)  TempSrc: Oral Oral  Oral  SpO2: 100% 100% 98%   Weight:      Height:        Intake/Output Summary (Last 24 hours) at 03/14/2021 1336 Last data filed at 03/14/2021 0500 Gross per 24 hour  Intake 730 ml  Output 800 ml  Net -70 ml    Filed Weights   03/12/21 1538 03/13/21 0430 03/14/21 0020  Weight: 127 kg 131.2 kg 131.3 kg    Examination: General exam: Appears calm and comfortable, morbidly obese Respiratory system: Diminished breath sounds due to body  habitus however she has faint bibasilar crackles. Respiratory effort normal. Cardiovascular system: S1 & S2 heard, RRR. No JVD, murmurs, rubs, gallops or clicks.  +2 pitting edema bilateral lower extremity Gastrointestinal system: Abdomen is nondistended, soft and nontender. No organomegaly or masses felt. Normal bowel sounds heard. Central nervous system: Alert and oriented. No focal neurological deficits. Extremities: Symmetric 5 x 5 power. Skin: No rashes, lesions or ulcers.  Psychiatry: Judgement and insight appear normal. Mood & affect appropriate.    Data Reviewed: I have personally reviewed following labs and imaging studies  CBC: Recent Labs  Lab 03/10/21 1653 03/11/21 1950 03/13/21 0237  WBC 9.4 7.1 7.3  NEUTROABS  --  6.1  --   HGB  12.5 12.0 12.5  HCT 39.1 37.7 38.8  MCV 88 90.8 90.2  PLT 244 217 716    Basic Metabolic Panel: Recent Labs  Lab 03/10/21 1653 03/11/21 1950 03/13/21 0237 03/14/21 0449  NA 142 136 138 137  K 4.4 4.4 4.1 3.4*  CL 97 94* 94* 93*  CO2 32* 34* 37* 36*  GLUCOSE 146* 216* 180* 132*  BUN 27 27* 26* 29*  CREATININE 1.13* 1.15* 1.36* 1.19*  CALCIUM 9.8 9.1 9.2 8.7*    GFR: Estimated Creatinine Clearance: 54.1 mL/min (A) (by C-G formula based on SCr of 1.19 mg/dL (H)). Liver Function Tests: Recent Labs  Lab 03/11/21 1950  AST 53*  ALT 49*  ALKPHOS 109  BILITOT 0.3  PROT 8.0  ALBUMIN 3.4*    No results for input(s): LIPASE, AMYLASE in the last 168 hours. No results for input(s): AMMONIA in the last 168 hours. Coagulation Profile: No results for input(s): INR, PROTIME in the last 168 hours. Cardiac Enzymes: No results for input(s): CKTOTAL, CKMB, CKMBINDEX, TROPONINI in the last 168 hours. BNP (last 3 results) Recent Labs    03/10/21 1653  PROBNP 286    HbA1C: Recent Labs    03/12/21 1818  HGBA1C 6.7*    CBG: Recent Labs  Lab 03/13/21 1121 03/13/21 1631 03/13/21 2123 03/14/21 0532 03/14/21 1204  GLUCAP 125*  162* 165* 116* 157*    Lipid Profile: No results for input(s): CHOL, HDL, LDLCALC, TRIG, CHOLHDL, LDLDIRECT in the last 72 hours. Thyroid Function Tests: No results for input(s): TSH, T4TOTAL, FREET4, T3FREE, THYROIDAB in the last 72 hours. Anemia Panel: No results for input(s): VITAMINB12, FOLATE, FERRITIN, TIBC, IRON, RETICCTPCT in the last 72 hours. Sepsis Labs: No results for input(s): PROCALCITON, LATICACIDVEN in the last 168 hours.  Recent Results (from the past 240 hour(s))  Resp Panel by RT-PCR (Flu A&B, Covid) Nasopharyngeal Swab     Status: None   Collection Time: 03/12/21  4:39 AM   Specimen: Nasopharyngeal Swab; Nasopharyngeal(NP) swabs in vial transport medium  Result Value Ref Range Status   SARS Coronavirus 2 by RT PCR NEGATIVE NEGATIVE Final    Comment: (NOTE) SARS-CoV-2 target nucleic acids are NOT DETECTED.  The SARS-CoV-2 RNA is generally detectable in upper respiratory specimens during the acute phase of infection. The lowest concentration of SARS-CoV-2 viral copies this assay can detect is 138 copies/mL. A negative result does not preclude SARS-Cov-2 infection and should not be used as the sole basis for treatment or other patient management decisions. A negative result may occur with  improper specimen collection/handling, submission of specimen other than nasopharyngeal swab, presence of viral mutation(s) within the areas targeted by this assay, and inadequate number of viral copies(<138 copies/mL). A negative result must be combined with clinical observations, patient history, and epidemiological information. The expected result is Negative.  Fact Sheet for Patients:  EntrepreneurPulse.com.au  Fact Sheet for Healthcare Providers:  IncredibleEmployment.be  This test is no t yet approved or cleared by the [place] FDA and  has been authorized for detection and/or diagnosis of SARS-CoV-2 by FDA under an Emergency  Use Authorization (EUA). This EUA will remain  in effect (meaning this test can be used) for the duration of the COVID-19 declaration under Section 564(b)(1) of the Act, 21 U.S.C.section 360bbb-3(b)(1), unless the authorization is terminated  or revoked sooner.       Influenza A by PCR NEGATIVE NEGATIVE Final   Influenza B by PCR NEGATIVE NEGATIVE Final    Comment: (NOTE)  The Xpert Xpress SARS-CoV-2/FLU/RSV plus assay is intended as an aid in the diagnosis of influenza from Nasopharyngeal swab specimens and should not be used as a sole basis for treatment. Nasal washings and aspirates are unacceptable for Xpert Xpress SARS-CoV-2/FLU/RSV testing.  Fact Sheet for Patients: EntrepreneurPulse.com.au  Fact Sheet for Healthcare Providers: IncredibleEmployment.be  This test is not yet approved or cleared by the [place] FDA and has been authorized for detection and/or diagnosis of SARS-CoV-2 by FDA under an Emergency Use Authorization (EUA). This EUA will remain in effect (meaning this test can be used) for the duration of the COVID-19 declaration under Section 564(b)(1) of the Act, 21 U.S.C. section 360bbb-3(b)(1), unless the authorization is terminated or revoked.  Performed at [facility], [address] [place], [place] [address]        Radiology Studies: ECHOCARDIOGRAM COMPLETE  Result Date: 03/12/2021    ECHOCARDIOGRAM REPORT   Patient Name:   [facility] MAE Mathews Date of Exam: 03/12/2021 Medical Rec #:  [id number]             Height:       62.0 in Accession #:    [id number]            Weight:       280.0 lb Date of Birth:  [birth year]              BSA:          2.206 m Patient Age:    42 years              BP:           152/75 mmHg Patient Gender: F                     HR:           74 bpm. Exam Location:  Inpatient Procedure: 2D Echo, Color Doppler, Cardiac Doppler and Intracardiac            Opacification Agent  Indications:    CHF  History:        Patient has prior history of Echocardiogram examinations, most                 recent 03/28/2017. Risk Factors:Morbid obesity. Asthma. Elevated                 troponins.  Sonographer:    Merrie Roof RDCS Referring Phys: [id number] JENNIFER YATES  Sonographer Comments: Patient is morbidly obese and no subcostal window. IMPRESSIONS  1. Left ventricular ejection fraction, by estimation, is 60 to 65%. The left ventricle has normal function. The left ventricle has no regional wall motion abnormalities. The left ventricular internal cavity size was mildly dilated. There is mild concentric left ventricular hypertrophy. Left ventricular diastolic parameters are consistent with Grade II diastolic dysfunction (pseudonormalization). Elevated left ventricular end-diastolic pressure.  2. Right ventricular systolic function is normal. The right ventricular size is normal.  3. Left atrial size was mild to moderately dilated.  4. The mitral valve was not well visualized. Trivial mitral valve regurgitation. No evidence of mitral stenosis. Moderate mitral annular calcification.  5. The aortic valve was not well visualized. Aortic valve regurgitation is mild. Comparison(s): No significant change from prior study. Conclusion(s)/Recommendation(s): Technically challenging images even with echo contrast. Normal LVEF, no severe valve disease by Doppler. FINDINGS  Left Ventricle: Left ventricular ejection fraction, by estimation, is 60 to 65%. The left ventricle has normal function. The  left ventricle has no regional wall motion abnormalities. Definity contrast agent was given IV to delineate the left ventricular  endocardial borders. The left ventricular internal cavity size was mildly dilated. There is mild concentric left ventricular hypertrophy. Left ventricular diastolic parameters are consistent with Grade II diastolic dysfunction (pseudonormalization). Elevated left ventricular end-diastolic pressure.  Right Ventricle: The right ventricular size is normal. Right vetricular wall thickness was not well visualized. Right ventricular systolic function is normal. Left Atrium: Left atrial size was mild to moderately dilated. Right Atrium: Right atrial size was not well visualized. Pericardium: There is no evidence of pericardial effusion. Presence of epicardial fat layer. Mitral Valve: The mitral valve was not well visualized. Moderate mitral annular calcification. Trivial mitral valve regurgitation. No evidence of mitral valve stenosis. Tricuspid Valve: The tricuspid valve is not well visualized. Tricuspid valve regurgitation is trivial. No evidence of tricuspid stenosis. Aortic Valve: The aortic valve was not well visualized. Aortic valve regurgitation is mild. Aortic valve mean gradient measures 11.0 mmHg. Aortic valve peak gradient measures 19.4 mmHg. Aortic valve area, by VTI measures 1.65 cm. Pulmonic Valve: The pulmonic valve was not well visualized. Pulmonic valve regurgitation is not visualized. Aorta: The aortic root, ascending aorta and aortic arch are all structurally normal, with no evidence of dilitation or obstruction. Venous: The inferior vena cava was not well visualized. IAS/Shunts: The interatrial septum was not well visualized.  LEFT VENTRICLE PLAX 2D LVIDd:         5.60 cm   Diastology LVIDs:         3.40 cm   LV e' medial:    8.38 cm/s LV PW:         1.30 cm   LV E/e' medial:  16.3 LV IVS:        1.30 cm   LV e' lateral:   4.68 cm/s LVOT diam:     2.00 cm   LV E/e' lateral: 29.3 LV SV:         74 LV SV Index:   34 LVOT Area:     3.14 cm  RIGHT VENTRICLE RV Basal diam:  3.00 cm LEFT ATRIUM         Index       RIGHT ATRIUM           Index LA diam:    4.50 cm 2.04 cm/m  RA Area:     15.20 cm                                 RA Volume:   36.00 ml  16.32 ml/m  AORTIC VALVE AV Area (Vmax):    1.71 cm AV Area (Vmean):   1.63 cm AV Area (VTI):     1.65 cm AV Vmax:           220.00 cm/s AV Vmean:           155.000 cm/s AV VTI:            0.450 m AV Peak Grad:      19.4 mmHg AV Mean Grad:      11.0 mmHg LVOT Vmax:         120.00 cm/s LVOT Vmean:        80.400 cm/s LVOT VTI:          0.236 m LVOT/AV VTI ratio: 0.52  AORTA Ao Root diam: 3.20 cm Ao Asc diam:  2.80 cm MITRAL  VALVE MV Area (PHT): 3.65 cm     SHUNTS MV Decel Time: 208 msec     Systemic VTI:  0.24 m MV E velocity: 137.00 cm/s  Systemic Diam: 2.00 cm MV A velocity: 96.50 cm/s MV E/A ratio:  1.42 Buford Dresser MD Electronically signed by Buford Dresser MD Signature Date/Time: 03/12/2021/8:10:06 PM    Final     Scheduled Meds:  carvedilol  6.25 mg Oral Daily   citalopram  40 mg Oral Daily   docusate sodium  100 mg Oral BID   enoxaparin (LOVENOX) injection  40 mg Subcutaneous Q24H   hydrALAZINE  50 mg Oral BID   insulin aspart  0-15 Units Subcutaneous TID WC   insulin aspart  0-5 Units Subcutaneous QHS   losartan  75 mg Oral Daily   nystatin  1 application Topical TID   pantoprazole  40 mg Oral Daily   predniSONE  40 mg Oral Q breakfast   sodium chloride flush  3 mL Intravenous Q12H   spironolactone  12.5 mg Oral Daily   torsemide  20 mg Oral BID   Continuous Infusions:   LOS: 0 days   Time spent: 28 minutes   Darliss Cheney, MD [facility]  03/14/2021, 1:36 PM  Please page via Amion and do not message via secure chat for anything urgent. Secure chat can be used for anything non urgent.  How to contact the [facility] Attending or Consulting provider [place] or covering provider during after hours [place], for this patient?  Check the care team in [facility] and look for a) attending/consulting TRH provider listed and b) the [facility] team listed. Page or secure chat [phone]. Log into [email or web address] and use [place]'s universal password to access. If you do not have the password, please contact the hospital operator. Locate the [facility] provider you are looking for under [facility] and page to a number that you can be  directly reached. If you still have difficulty reaching the provider, please page the [facility] (Director on Call) for the Hospitalists listed on amion for assistance.

## 2021-03-15 ENCOUNTER — Other Ambulatory Visit: Payer: Self-pay | Admitting: Physician Assistant

## 2021-03-15 DIAGNOSIS — R079 Chest pain, unspecified: Secondary | ICD-10-CM | POA: Diagnosis not present

## 2021-03-15 DIAGNOSIS — I5033 Acute on chronic diastolic (congestive) heart failure: Secondary | ICD-10-CM

## 2021-03-15 DIAGNOSIS — J9621 Acute and chronic respiratory failure with hypoxia: Secondary | ICD-10-CM | POA: Diagnosis not present

## 2021-03-15 LAB — RENAL FUNCTION PANEL
Albumin: 3.2 g/dL — ABNORMAL LOW (ref 3.5–5.0)
Anion gap: 9 (ref 5–15)
BUN: 28 mg/dL — ABNORMAL HIGH (ref 8–23)
CO2: 35 mmol/L — ABNORMAL HIGH (ref 22–32)
Calcium: 8.8 mg/dL — ABNORMAL LOW (ref 8.9–10.3)
Chloride: 95 mmol/L — ABNORMAL LOW (ref 98–111)
Creatinine, Ser: 1.16 mg/dL — ABNORMAL HIGH (ref 0.44–1.00)
GFR, Estimated: 49 mL/min — ABNORMAL LOW (ref >60)
Glucose, Bld: 124 mg/dL — ABNORMAL HIGH (ref 70–99)
Phosphorus: 3.8 mg/dL (ref 2.5–4.6)
Potassium: 3.9 mmol/L (ref 3.5–5.1)
Sodium: 139 mmol/L (ref 135–145)

## 2021-03-15 LAB — GLUCOSE, CAPILLARY
Glucose-Capillary: 111 mg/dL — ABNORMAL HIGH (ref 70–99)
Glucose-Capillary: 111 mg/dL — ABNORMAL HIGH (ref 70–99)
Glucose-Capillary: 187 mg/dL — ABNORMAL HIGH (ref 70–99)

## 2021-03-15 MED ORDER — SPIRONOLACTONE 25 MG PO TABS: 12.5000 mg | ORAL_TABLET | Freq: Every day | ORAL | 0 refills | Status: DC

## 2021-03-15 MED ORDER — LOSARTAN POTASSIUM 25 MG PO TABS: 75.0000 mg | ORAL_TABLET | Freq: Every day | ORAL | 0 refills | Status: DC

## 2021-03-15 MED ORDER — TORSEMIDE 20 MG PO TABS: 20.0000 mg | ORAL_TABLET | Freq: Two times a day (BID) | ORAL | 0 refills | Status: AC

## 2021-03-15 MED ORDER — CARVEDILOL 6.25 MG PO TABS
6.2500 mg | ORAL_TABLET | Freq: Two times a day (BID) | ORAL | Status: DC
  Administered 2021-03-15: 09:00:00 6.25 mg via ORAL
  Filled 2021-03-15: qty 1

## 2021-03-15 MED ORDER — HYDRALAZINE HCL 50 MG PO TABS: 50.0000 mg | ORAL_TABLET | Freq: Two times a day (BID) | ORAL | 0 refills | Status: DC

## 2021-03-15 NOTE — Progress Notes (Signed)
Discharge instructions reviewed with patient and instructions given regarding fluid intake and monitoring weight. HF book given to patient as well. IV removed. Patient verbalized understanding of discharge instructions, to be transported to private vehicle with home O2.

## 2021-03-15 NOTE — TOC Transition Note (Addendum)
Transition of Care [facility]) - CM/SW Discharge Note   Patient Details  Name: Donna Shaffer MRN: [id number] Date of Birth: January 21, 1946  [facility] CM/SW Contact:  Carles Collet, RN Phone Number: 03/15/2021, 9:53 AM   Clinical Narrative:   spoke w patient and family at bedside. Patient has DME including Oxygen ([facility]) needed at home already. Family to provide transportation home w home oxygen they are bringing. HH referral sent to [place], accepted. Start of care Friday    Final next level of care: [place] Barriers to Discharge: No Barriers Identified   Patient Goals and CMS Choice Patient states their goals for this hospitalization and ongoing recovery are:: to go home CMS Medicare.gov Compare Post Acute Care list provided to:: Patient Choice offered to / list presented to : Patient  Discharge Placement                       Discharge Plan and Services                DME Arranged: N/A         HH Arranged: PT, OT, Nurse's Aide HH Agency: [place] Date [facility] Agency Contacted: 03/15/21 Time [place]: [phone] Representative spoke with at [place]: [place] ([place]) Interventions     Readmission Risk Interventions No flowsheet data found.

## 2021-03-15 NOTE — Discharge Summary (Signed)
Physician Discharge Summary  Donna Shaffer UVO:[id number] DOB: 1945/06/09 DOA: 03/11/2021  PCP: Myrlene Broker, MD  Admit date: 03/11/2021 Discharge date: 03/15/2021    Admitted From: Home Disposition: Home  Recommendations for Outpatient Follow-up:  Follow up with PCP in 1-2 weeks Please obtain BMP/CBC in one week Follow-up with your cardiologist in 2 weeks Follow-up outpatient for repeat sleep study. Please follow up with your PCP on the following pending results: Unresulted Labs (From admission, onward)    None         Home Health: Yes Equipment/Devices: None  Discharge Condition: Stable CODE STATUS: Full code Diet recommendation: Low-sodium  Subjective: Seen and examined.  Breathing is improved.  Son and sister at the bedside.  She is ready to go home.  Brief/Interim Summary:  Donna Shaffer is a 75 y.o. female with medical history significant of remote breast cancer; chronic diastolic CHF; HTN; depression/anxiety; morbid obesity; OSA; psoriasis with psoriatic arthritis; and DM presented with SOB.  She is normally on 2-3L oxygen at home.  She was admitted with the following problems with detailed hospitalization as below.   Acute on chronic respiratory failure with hypoxia secondary to combination of acute on chronic congestive heart failure with preserved ejection fraction, acute asthma exacerbation and OHS/OSA: -Patient has chronic respiratory failure and usually wears 2-3L  O2 -She has reported h/o asthma (no tobacco history). She also very likely has OHS in addition to OSA. CXR consistent with mild pulmonary edema. Mildly elevated BNP with known baseline.  Aldactone and Cozaar was added at the time of admission.  She was also started on IV diuretics.  Cardiology was consulted.  They discontinued her IV Lasix and we resumed her torsemide from home, she was taking 20 mg daily, after discussing with cardiology, we increase to 20 mg twice daily.   Cardiology opines that she is now at her baseline weight and they have cleared her for discharge.  She was also given prednisone for possible asthma exacerbation.   Obesity hypoventilation syndrome/OSA, POA: Patient does not like to wear CPAP although she is advised to do so.  Also possibly she has OHS.  I have spent great length of time with her counseling her about weight loss and being compliant with CPAP.  He verbalized understanding and she appears to be committed however despite of counseling, she continued to refuse CPAP while in the hospital.  She is well aware that if she were not to be compliant with her CPAP machine, she may be at risk of recurrent hospitalization due to congestive heart failure.   Hypertension: Better now.  During this hospitalization, cardiology increased her hydralazine from 25mg  to 50 mg 3 times daily and added Cozaar as well as Aldactone.  Resume home dose of Coreg.   Type 2 diabetes mellitus with hyperglycemia, without long-term current use of insulin, POA: Resume home medications.   Morbid obesity:  Body mass index is 51.21 kg/m.  Extensive counseling on weight loss.  She seems committed.  Discharge Diagnoses:  Principal Problem:   Acute on chronic respiratory failure with hypoxia (HCC) Active Problems:   Breast cancer of upper-outer quadrant of left female breast (HCC)   Chest pain of uncertain etiology   OSA (obstructive sleep apnea)   Morbid obesity (HCC)   Type 2 diabetes mellitus with hyperglycemia, without long-term current use of insulin (HCC)   Hypertension   Yeast dermatitis   Acute on chronic heart failure with preserved ejection fraction (HFpEF) ([place])   Obesity  hypoventilation syndrome [facility])    Discharge Instructions   Allergies as of 03/15/2021       Reactions   Sulfa Antibiotics Swelling, Other (See Comments), Hives   Ace Inhibitors Cough, Other (See Comments)   Sulfasalazine Swelling   Iodinated Contrast Media    Iohexol          Medication List     TAKE these medications    albuterol (2.5 MG/3ML) 0.083% nebulizer solution Commonly known as: PROVENTIL Take 3 mLs (2.5 mg total) by nebulization every 6 (six) hours as needed for wheezing or shortness of breath.   aspirin-acetaminophen-caffeine 250-250-65 MG tablet Commonly known as: EXCEDRIN MIGRAINE Take 1 tablet by mouth every 8 (eight) hours as needed for migraine.   calcium carbonate 600 MG Tabs tablet Commonly known as: OS-CAL Take 1 tablet by mouth daily.   carvedilol 6.25 MG tablet Commonly known as: COREG TAKE 1 TABLET(6.25 MG) BY MOUTH DAILY   cholecalciferol 25 MCG (1000 UNIT) tablet Commonly known as: VITAMIN D3 Take 1,000 Units by mouth daily.   cholecalciferol 1000 units tablet Commonly known as: VITAMIN D Take 1 tablet (1,000 Units total) by mouth daily.   citalopram 40 MG tablet Commonly known as: CELEXA Take 40 mg by mouth daily.   Cosentyx (300 MG Dose) 150 MG/ML Sosy Generic drug: Secukinumab (300 MG Dose) Inject 300 mg into the skin every 30 (thirty) days.   dicyclomine 10 MG capsule Commonly known as: BENTYL Take 10 mg by mouth every 6 (six) hours as needed for bladder spasms.   hydrALAZINE 50 MG tablet Commonly known as: APRESOLINE Take 1 tablet (50 mg total) by mouth 2 (two) times daily. What changed:  medication strength See the new instructions.   losartan 25 MG tablet Commonly known as: COZAAR Take 3 tablets (75 mg total) by mouth daily. Start taking on: March 16, 2021   nitroGLYCERIN 0.4 MG SL tablet Commonly known as: NITROSTAT Place 0.4 mg under the tongue every 5 (five) minutes as needed for chest pain.   OXYGEN Inhale 3 L into the lungs continuous.   pantoprazole 40 MG tablet Commonly known as: PROTONIX Take 40 mg by mouth daily. Reported on 07/03/2015   potassium chloride SA 20 MEQ tablet Commonly known as: Klor-Con M20 Take 2 tablets (40 mEq total) by mouth daily. (please follow up with your  PCP within a few days for repeat labs)   predniSONE 20 MG tablet Commonly known as: DELTASONE Take 1 tablet (20 mg total) by mouth 3 (three) times daily for 2 days, THEN 1 tablet (20 mg total) 2 (two) times daily for 2 days, THEN 1 tablet (20 mg total) daily with breakfast for 4 days, THEN 0.5 tablets (10 mg total) daily with breakfast for 4 days. Start taking on: March 10, 2021   spironolactone 25 MG tablet Commonly known as: ALDACTONE Take 0.5 tablets (12.5 mg total) by mouth daily. Start taking on: March 16, 2021   torsemide 20 MG tablet Commonly known as: DEMADEX Take 1 tablet (20 mg total) by mouth 2 (two) times daily. Take 1 tablet daily & Take 1 tablet Daily PRN for Swelling What changed: when to take this        Follow-up Information     Myrlene Broker, MD Follow up in 1 week(s).   Specialty: Family Medicine Contact information: [place] [address] [phone]         Richardo Priest, MD .   Specialties: Cardiology, Radiology  Contact information: [place] [address] [phone]                Allergies  Allergen Reactions   Sulfa Antibiotics Swelling, Other (See Comments) and Hives   Ace Inhibitors Cough and Other (See Comments)   Sulfasalazine Swelling   Iodinated Contrast Media    Iohexol     Consultations: Cardiology   Procedures/Studies: DG Chest 2 View  Result Date: 03/11/2021 CLINICAL DATA:  Shortness of breath. EXAM: CHEST - 2 VIEW COMPARISON:  Chest x-ray 08/08/2020. FINDINGS: The heart is enlarged as seen on the prior study. There is central pulmonary vascular congestion. There are minimal strandy opacities in the lung bases. There is no pleural effusion or pneumothorax. Left axillary surgical clips are present. No acute fractures are identified. IMPRESSION: 1. Cardiomegaly with mild interstitial edema. Electronically Signed   By: Ronney Asters M.D.   On: 03/11/2021 20:24   ECHOCARDIOGRAM  COMPLETE  Result Date: 03/12/2021    ECHOCARDIOGRAM REPORT   Patient Name:   [facility] MAE Mcwilliams Date of Exam: 03/12/2021 Medical Rec #:  [id number]             Height:       62.0 in Accession #:    [id number]            Weight:       280.0 lb Date of Birth:  04/08/1945              BSA:          2.206 m Patient Age:    76 years              BP:           152/75 mmHg Patient Gender: F                     HR:           74 bpm. Exam Location:  Inpatient Procedure: 2D Echo, Color Doppler, Cardiac Doppler and Intracardiac            Opacification Agent Indications:    CHF  History:        Patient has prior history of Echocardiogram examinations, most                 recent 03/28/2017. Risk Factors:Morbid obesity. Asthma. Elevated                 troponins.  Sonographer:    Merrie Roof RDCS Referring Phys: [id number] JENNIFER YATES  Sonographer Comments: Patient is morbidly obese and no subcostal window. IMPRESSIONS  1. Left ventricular ejection fraction, by estimation, is 60 to 65%. The left ventricle has normal function. The left ventricle has no regional wall motion abnormalities. The left ventricular internal cavity size was mildly dilated. There is mild concentric left ventricular hypertrophy. Left ventricular diastolic parameters are consistent with Grade II diastolic dysfunction (pseudonormalization). Elevated left ventricular end-diastolic pressure.  2. Right ventricular systolic function is normal. The right ventricular size is normal.  3. Left atrial size was mild to moderately dilated.  4. The mitral valve was not well visualized. Trivial mitral valve regurgitation. No evidence of mitral stenosis. Moderate mitral annular calcification.  5. The aortic valve was not well visualized. Aortic valve regurgitation is mild. Comparison(s): No significant change from prior study. Conclusion(s)/Recommendation(s): Technically challenging images even with echo contrast. Normal LVEF, no severe valve disease by Doppler. FINDINGS   Left Ventricle: Left ventricular ejection  fraction, by estimation, is 60 to 65%. The left ventricle has normal function. The left ventricle has no regional wall motion abnormalities. Definity contrast agent was given IV to delineate the left ventricular  endocardial borders. The left ventricular internal cavity size was mildly dilated. There is mild concentric left ventricular hypertrophy. Left ventricular diastolic parameters are consistent with Grade II diastolic dysfunction (pseudonormalization). Elevated left ventricular end-diastolic pressure. Right Ventricle: The right ventricular size is normal. Right vetricular wall thickness was not well visualized. Right ventricular systolic function is normal. Left Atrium: Left atrial size was mild to moderately dilated. Right Atrium: Right atrial size was not well visualized. Pericardium: There is no evidence of pericardial effusion. Presence of epicardial fat layer. Mitral Valve: The mitral valve was not well visualized. Moderate mitral annular calcification. Trivial mitral valve regurgitation. No evidence of mitral valve stenosis. Tricuspid Valve: The tricuspid valve is not well visualized. Tricuspid valve regurgitation is trivial. No evidence of tricuspid stenosis. Aortic Valve: The aortic valve was not well visualized. Aortic valve regurgitation is mild. Aortic valve mean gradient measures 11.0 mmHg. Aortic valve peak gradient measures 19.4 mmHg. Aortic valve area, by VTI measures 1.65 cm. Pulmonic Valve: The pulmonic valve was not well visualized. Pulmonic valve regurgitation is not visualized. Aorta: The aortic root, ascending aorta and aortic arch are all structurally normal, with no evidence of dilitation or obstruction. Venous: The inferior vena cava was not well visualized. IAS/Shunts: The interatrial septum was not well visualized.  LEFT VENTRICLE PLAX 2D LVIDd:         5.60 cm   Diastology LVIDs:         3.40 cm   LV e' medial:    8.38 cm/s LV PW:          1.30 cm   LV E/e' medial:  16.3 LV IVS:        1.30 cm   LV e' lateral:   4.68 cm/s LVOT diam:     2.00 cm   LV E/e' lateral: 29.3 LV SV:         74 LV SV Index:   34 LVOT Area:     3.14 cm  RIGHT VENTRICLE RV Basal diam:  3.00 cm LEFT ATRIUM         Index       RIGHT ATRIUM           Index LA diam:    4.50 cm 2.04 cm/m  RA Area:     15.20 cm                                 RA Volume:   36.00 ml  16.32 ml/m  AORTIC VALVE AV Area (Vmax):    1.71 cm AV Area (Vmean):   1.63 cm AV Area (VTI):     1.65 cm AV Vmax:           220.00 cm/s AV Vmean:          155.000 cm/s AV VTI:            0.450 m AV Peak Grad:      19.4 mmHg AV Mean Grad:      11.0 mmHg LVOT Vmax:         120.00 cm/s LVOT Vmean:        80.400 cm/s LVOT VTI:          0.236 m LVOT/AV VTI ratio: 0.52  AORTA Ao Root diam: 3.20 cm Ao Asc diam:  2.80 cm MITRAL VALVE MV Area (PHT): 3.65 cm     SHUNTS MV Decel Time: 208 msec     Systemic VTI:  0.24 m MV E velocity: 137.00 cm/s  Systemic Diam: 2.00 cm MV A velocity: 96.50 cm/s MV E/A ratio:  1.42 Buford Dresser MD Electronically signed by Buford Dresser MD Signature Date/Time: 03/12/2021/8:10:06 PM    Final      Discharge Exam: Vitals:   03/14/21 1935 03/15/21 0512  BP: (!) 175/78 (!) 164/75  Pulse: 62 66  Resp: (!) 21 (!) 22  Temp: 97.7 F (36.5 C) 97.6 F (36.4 C)  SpO2: 100% 100%   Vitals:   03/14/21 0730 03/14/21 1055 03/14/21 1935 03/15/21 0512  BP:   (!) 175/78 (!) 164/75  Pulse:  65 62 66  Resp:   (!) 21 (!) 22  Temp:  97.9 F (36.6 C) 97.7 F (36.5 C) 97.6 F (36.4 C)  TempSrc:  Oral Oral Oral  SpO2: 98%  100% 100%  Weight:    131.2 kg  Height:        General: Pt is alert, awake, not in acute distress, morbidly obese Cardiovascular: RRR, S1/S2 +, no rubs, no gallops Respiratory: CTA bilaterally, no wheezing, no rhonchi Abdominal: Soft, NT, ND, bowel sounds + Extremities: +1 pitting edema bilateral lower extremity, no cyanosis    The results of  significant diagnostics from this hospitalization (including imaging, microbiology, ancillary and laboratory) are listed below for reference.     Microbiology: Recent Results (from the past 240 hour(s))  Resp Panel by RT-PCR (Flu A&B, Covid) Nasopharyngeal Swab     Status: None   Collection Time: 03/12/21  4:39 AM   Specimen: Nasopharyngeal Swab; Nasopharyngeal(NP) swabs in vial transport medium  Result Value Ref Range Status   SARS Coronavirus 2 by RT PCR NEGATIVE NEGATIVE Final    Comment: (NOTE) SARS-CoV-2 target nucleic acids are NOT DETECTED.  The SARS-CoV-2 RNA is generally detectable in upper respiratory specimens during the acute phase of infection. The lowest concentration of SARS-CoV-2 viral copies this assay can detect is 138 copies/mL. A negative result does not preclude SARS-Cov-2 infection and should not be used as the sole basis for treatment or other patient management decisions. A negative result may occur with  improper specimen collection/handling, submission of specimen other than nasopharyngeal swab, presence of viral mutation(s) within the areas targeted by this assay, and inadequate number of viral copies(<138 copies/mL). A negative result must be combined with clinical observations, patient history, and epidemiological information. The expected result is Negative.  Fact Sheet for Patients:  EntrepreneurPulse.com.au  Fact Sheet for Healthcare Providers:  IncredibleEmployment.be  This test is no t yet approved or cleared by the [place] FDA and  has been authorized for detection and/or diagnosis of SARS-CoV-2 by FDA under an Emergency Use Authorization (EUA). This EUA will remain  in effect (meaning this test can be used) for the duration of the COVID-19 declaration under Section 564(b)(1) of the Act, 21 U.S.C.section 360bbb-3(b)(1), unless the authorization is terminated  or revoked sooner.       Influenza A by  PCR NEGATIVE NEGATIVE Final   Influenza B by PCR NEGATIVE NEGATIVE Final    Comment: (NOTE) The Xpert Xpress SARS-CoV-2/FLU/RSV plus assay is intended as an aid in the diagnosis of influenza from Nasopharyngeal swab specimens and should not be used as a sole basis for treatment. Nasal washings and aspirates are unacceptable for  Xpert Xpress SARS-CoV-2/FLU/RSV testing.  Fact Sheet for Patients: EntrepreneurPulse.com.au  Fact Sheet for Healthcare Providers: IncredibleEmployment.be  This test is not yet approved or cleared by the [place] FDA and has been authorized for detection and/or diagnosis of SARS-CoV-2 by FDA under an Emergency Use Authorization (EUA). This EUA will remain in effect (meaning this test can be used) for the duration of the COVID-19 declaration under Section 564(b)(1) of the Act, 21 U.S.C. section 360bbb-3(b)(1), unless the authorization is terminated or revoked.  Performed at [facility], [address] [place], [place] [address]      Labs: BNP (last 3 results) Recent Labs    03/11/21 2352  BNP 967.5*   Basic Metabolic Panel: Recent Labs  Lab 03/10/21 1653 03/11/21 1950 03/13/21 0237 03/14/21 0449 03/15/21 0219  NA 142 136 138 137 139  K 4.4 4.4 4.1 3.4* 3.9  CL 97 94* 94* 93* 95*  CO2 32* 34* 37* 36* 35*  GLUCOSE 146* 216* 180* 132* 124*  BUN 27 27* 26* 29* 28*  CREATININE 1.13* 1.15* 1.36* 1.19* 1.16*  CALCIUM 9.8 9.1 9.2 8.7* 8.8*  PHOS  --   --   --   --  3.8   Liver Function Tests: Recent Labs  Lab 03/11/21 1950 03/15/21 0219  AST 53*  --   ALT 49*  --   ALKPHOS 109  --   BILITOT 0.3  --   PROT 8.0  --   ALBUMIN 3.4* 3.2*   No results for input(s): LIPASE, AMYLASE in the last 168 hours. No results for input(s): AMMONIA in the last 168 hours. CBC: Recent Labs  Lab 03/10/21 1653 03/11/21 1950 03/13/21 0237  WBC 9.4 7.1 7.3  NEUTROABS  --  6.1  --   HGB 12.5 12.0 12.5   HCT 39.1 37.7 38.8  MCV 88 90.8 90.2  PLT 244 217 249   Cardiac Enzymes: No results for input(s): CKTOTAL, CKMB, CKMBINDEX, TROPONINI in the last 168 hours. BNP: Invalid input(s): POCBNP CBG: Recent Labs  Lab 03/14/21 1204 03/14/21 1643 03/14/21 2121 03/15/21 0625 03/15/21 0807  GLUCAP 157* 204* 164* 111* 111*   D-Dimer No results for input(s): DDIMER in the last 72 hours. Hgb A1c Recent Labs    03/12/21 1818  HGBA1C 6.7*   Lipid Profile No results for input(s): CHOL, HDL, LDLCALC, TRIG, CHOLHDL, LDLDIRECT in the last 72 hours. Thyroid function studies No results for input(s): TSH, T4TOTAL, T3FREE, THYROIDAB in the last 72 hours.  Invalid input(s): FREET3 Anemia work up No results for input(s): VITAMINB12, FOLATE, FERRITIN, TIBC, IRON, RETICCTPCT in the last 72 hours. Urinalysis    Component Value Date/Time   COLORURINE YELLOW 10/21/2018 1014   APPEARANCEUR CLOUDY (A) 10/21/2018 1014   LABSPEC 1.014 10/21/2018 1014   PHURINE 5.0 10/21/2018 1014   GLUCOSEU NEGATIVE 10/21/2018 1014   HGBUR NEGATIVE 10/21/2018 [place] 10/21/2018 1014   KETONESUR NEGATIVE 10/21/2018 1014   PROTEINUR NEGATIVE 10/21/2018 1014   NITRITE NEGATIVE 10/21/2018 1014   LEUKOCYTESUR LARGE (A) 10/21/2018 1014   Sepsis Labs Invalid input(s): PROCALCITONIN,  WBC,  LACTICIDVEN Microbiology Recent Results (from the past 240 hour(s))  Resp Panel by RT-PCR (Flu A&B, Covid) Nasopharyngeal Swab     Status: None   Collection Time: 03/12/21  4:39 AM   Specimen: Nasopharyngeal Swab; Nasopharyngeal(NP) swabs in vial transport medium  Result Value Ref Range Status   SARS Coronavirus 2 by RT PCR NEGATIVE NEGATIVE Final  Comment: (NOTE) SARS-CoV-2 target nucleic acids are NOT DETECTED.  The SARS-CoV-2 RNA is generally detectable in upper respiratory specimens during the acute phase of infection. The lowest concentration of SARS-CoV-2 viral copies this assay can detect is 138  copies/mL. A negative result does not preclude SARS-Cov-2 infection and should not be used as the sole basis for treatment or other patient management decisions. A negative result may occur with  improper specimen collection/handling, submission of specimen other than nasopharyngeal swab, presence of viral mutation(s) within the areas targeted by this assay, and inadequate number of viral copies(<138 copies/mL). A negative result must be combined with clinical observations, patient history, and epidemiological information. The expected result is Negative.  Fact Sheet for Patients:  EntrepreneurPulse.com.au  Fact Sheet for Healthcare Providers:  IncredibleEmployment.be  This test is no t yet approved or cleared by the [place] FDA and  has been authorized for detection and/or diagnosis of SARS-CoV-2 by FDA under an Emergency Use Authorization (EUA). This EUA will remain  in effect (meaning this test can be used) for the duration of the COVID-19 declaration under Section 564(b)(1) of the Act, 21 U.S.C.section 360bbb-3(b)(1), unless the authorization is terminated  or revoked sooner.       Influenza A by PCR NEGATIVE NEGATIVE Final   Influenza B by PCR NEGATIVE NEGATIVE Final    Comment: (NOTE) The Xpert Xpress SARS-CoV-2/FLU/RSV plus assay is intended as an aid in the diagnosis of influenza from Nasopharyngeal swab specimens and should not be used as a sole basis for treatment. Nasal washings and aspirates are unacceptable for Xpert Xpress SARS-CoV-2/FLU/RSV testing.  Fact Sheet for Patients: EntrepreneurPulse.com.au  Fact Sheet for Healthcare Providers: IncredibleEmployment.be  This test is not yet approved or cleared by the [place] FDA and has been authorized for detection and/or diagnosis of SARS-CoV-2 by FDA under an Emergency Use Authorization (EUA). This EUA will remain in effect (meaning  this test can be used) for the duration of the COVID-19 declaration under Section 564(b)(1) of the Act, 21 U.S.C. section 360bbb-3(b)(1), unless the authorization is terminated or revoked.  Performed at [facility], [address] [place], [place] [address]      Time coordinating discharge: Over 30 minutes  SIGNED:   Darliss Cheney, MD  [facility] 03/15/2021, 9:58 AM  If 7PM-7AM, please contact night-coverage [email or web address]

## 2021-03-15 NOTE — Progress Notes (Signed)
Progress Note  Patient Name: Donna Shaffer Date of Encounter: 03/15/2021  [facility] Cardiologist: Shirlee More, MD    Subjective   I/Os incomplete.  Stable creatinine (1.15 > 1.36 > 1.19 > 1.16).  BP 164/75 this morning.  Weight is unchanged this morning, down 8 pounds from admission.  Reports no further chest pain   Inpatient Medications    Scheduled Meds:  carvedilol  6.25 mg Oral Daily   citalopram  40 mg Oral Daily   docusate sodium  100 mg Oral BID   enoxaparin (LOVENOX) injection  40 mg Subcutaneous Q24H   hydrALAZINE  50 mg Oral BID   insulin aspart  0-15 Units Subcutaneous TID WC   insulin aspart  0-5 Units Subcutaneous QHS   losartan  75 mg Oral Daily   nystatin  1 application Topical TID   pantoprazole  40 mg Oral Daily   predniSONE  40 mg Oral Q breakfast   sodium chloride flush  3 mL Intravenous Q12H   spironolactone  12.5 mg Oral Daily   torsemide  20 mg Oral BID   Continuous Infusions:  PRN Meds: acetaminophen **OR** acetaminophen, albuterol, ALPRAZolam, bisacodyl, guaiFENesin-dextromethorphan, hydrALAZINE, ipratropium, morphine injection, ondansetron **OR** ondansetron (ZOFRAN) IV, oxyCODONE, polyethylene glycol   Vital Signs    Vitals:   03/14/21 0730 03/14/21 1055 03/14/21 1935 03/15/21 0512  BP:   (!) 175/78 (!) 164/75  Pulse:  65 62 66  Resp:   (!) 21 (!) 22  Temp:  97.9 F (36.6 C) 97.7 F (36.5 C) 97.6 F (36.4 C)  TempSrc:  Oral Oral Oral  SpO2: 98%  100% 100%  Weight:    131.2 kg  Height:        Intake/Output Summary (Last 24 hours) at 03/15/2021 0837 Last data filed at 03/15/2021 0600 Gross per 24 hour  Intake 1130 ml  Output 800 ml  Net 330 ml    Last 3 Weights 03/15/2021 03/14/2021 03/13/2021  Weight (lbs) 289 lb 4.8 oz 289 lb 6.4 oz 289 lb 3.2 oz  Weight (kg) 131.226 kg 131.271 kg 131.18 kg      Telemetry    NSR 60-70s. - Personally Reviewed  ECG    No new ECG - Personally Reviewed  Physical Exam    GEN: No acute distress.   Neck: No JVD appreciated difficult to assess given habitus Cardiac: RRR, no murmurs, rubs, or gallops.  Respiratory: Diminished breath sounds GI: Soft, nontender, non-distended  MS: No pitting edema Neuro:  Nonfocal  Psych: Normal affect   Labs    High Sensitivity Troponin:   Recent Labs  Lab 03/11/21 1950 03/11/21 2343 03/14/21 0918  TROPONINIHS 10 11 16       Chemistry Recent Labs  Lab 03/11/21 1950 03/13/21 0237 03/14/21 0449 03/15/21 0219  NA 136 138 137 139  K 4.4 4.1 3.4* 3.9  CL 94* 94* 93* 95*  CO2 34* 37* 36* 35*  GLUCOSE 216* 180* 132* 124*  BUN 27* 26* 29* 28*  CREATININE 1.15* 1.36* 1.19* 1.16*  CALCIUM 9.1 9.2 8.7* 8.8*  PROT 8.0  --   --   --   ALBUMIN 3.4*  --   --  3.2*  AST 53*  --   --   --   ALT 49*  --   --   --   ALKPHOS 109  --   --   --   BILITOT 0.3  --   --   --   GFRNONAA 50*  41* 48* 49*  ANIONGAP 8 7 8 9      Lipids No results for input(s): CHOL, TRIG, HDL, LABVLDL, LDLCALC, CHOLHDL in the last 168 hours.  Hematology Recent Labs  Lab 03/10/21 1653 03/11/21 1950 03/13/21 0237  WBC 9.4 7.1 7.3  RBC 4.47 4.15 4.30  HGB 12.5 12.0 12.5  HCT 39.1 37.7 38.8  MCV 88 90.8 90.2  MCH 28.0 28.9 29.1  MCHC 32.0 31.8 32.2  RDW 12.6 13.2 13.3  PLT 244 217 249    Thyroid No results for input(s): TSH, FREET4 in the last 168 hours.  BNP Recent Labs  Lab 03/10/21 1653 03/11/21 2352  BNP  --  145.0*  PROBNP 286  --      DDimer No results for input(s): DDIMER in the last 168 hours.   Radiology    No results found.  Cardiac Studies   Echo 03/12/2021 1. Left ventricular ejection fraction, by estimation, is 60 to 65%. The  left ventricle has normal function. The left ventricle has no regional  wall motion abnormalities. The left ventricular internal cavity size was  mildly dilated. There is mild  concentric left ventricular hypertrophy. Left ventricular diastolic  parameters are consistent with Grade  II diastolic dysfunction  (pseudonormalization). Elevated left ventricular end-diastolic pressure.   2. Right ventricular systolic function is normal. The right ventricular  size is normal.   3. Left atrial size was mild to moderately dilated.   4. The mitral valve was not well visualized. Trivial mitral valve  regurgitation. No evidence of mitral stenosis. Moderate mitral annular  calcification.   5. The aortic valve was not well visualized. Aortic valve regurgitation  is mild.   Comparison(s): No significant change from prior study.   Conclusion(s)/Recommendation(s): Technically challenging images even with  echo contrast. Normal LVEF, no severe valve disease by Doppler.   Patient Profile     75 y.o. female with PMH of severe persistent asthma, HTN, CKD, DM II, OSA who presented with dyspnea.   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 Myoview in June 2020 was normal. Morbid obesity makes her a poor candidate for myoview or coronary CT. BMI 52.  Echo 03/12/2021 showed 60-65%, no regional wall motion abnormality, mild LVH, grade 2 DD, elevated LVEDP, normal RV size, trivial MR, mild AI.  -Given continuous chest pain hor hours with no troponin bump, no evidence of acute coronary syndrome.  No further cardiac work-up recommended as inpatient, can follow-up with her cardiologist as an outpatient to consider ischemia evaluation  - unclear how much volume overloaded she is, difficult to tell based on physical exam. On prednisone for wheezing. With 40mg  BID IV lasix, urine output was poor, Cr bumped up slightly in 2 days.  Improved with holding IV diuresis.  Switched to p.o. torsemide 20 mg twice daily, would continue  HTN: BP remain high, continue losartan and hydralazine.  Carvedilol is only being dosed once daily for unclear reasons, would increase to twice daily dosing   [facility] will sign off.   Medication  Recommendations: Torsemide 20 mg twice daily, carvedilol 6.25 mg twice daily, losartan 75 mg daily, spironolactone 12.5 mg daily, hydralazine 50 mg twice daily Other recommendations (labs, testing, etc):  BMET within 1 week Follow up as an outpatient:  Scheduled with Dr Bettina Gavia 04/02/21   For questions or updates, please contact [facility] Please consult [email or web address] for contact info under  Signed, Donato Heinz, MD  03/15/2021, 8:37 AM

## 2021-03-17 ENCOUNTER — Telehealth: Payer: Self-pay | Admitting: Cardiology

## 2021-03-17 NOTE — Telephone Encounter (Signed)
-----   Message from Liliane Shi, [place] sent at 03/15/2021  1:44 PM EST ----- Regarding: Hosp Follow Up Primary Cardiologist:  Bettina Gavia DC from hospital on 03/15/2021  Pt has a f/u appt scheduled with Dr. Bettina Gavia on 1/12 already.   But, she needs a BMET in 1 week.  Order is in [place].  Please arrange for around Friday 12/30 if possible.  Signed,  Richardson Dopp, PA-C   03/15/2021 1:44 PM

## 2021-03-17 NOTE — Telephone Encounter (Signed)
LVM for patient to come in for lab work/kbl 03/17/2021

## 2021-03-18 NOTE — Telephone Encounter (Signed)
Left voicemail for patient to have labs drawn 03/20/2021 prior to her scheduled appointment with Dr Bettina Gavia 04/02/2021 Instructed patient to call back if she had any questions

## 2021-03-18 NOTE — Telephone Encounter (Signed)
-----   Message from Liliane Shi, [place] sent at 03/15/2021  1:44 PM EST ----- Regarding: Hosp Follow Up Primary Cardiologist:  Bettina Gavia DC from hospital on 03/15/2021  Pt has a f/u appt scheduled with Dr. Bettina Gavia on 1/12 already.   But, she needs a BMET in 1 week.  Order is in [place].  Please arrange for around Friday 12/30 if possible.  Signed,  Richardson Dopp, PA-C   03/15/2021 1:44 PM

## 2021-04-02 ENCOUNTER — Ambulatory Visit: Payer: Medicare Other | Admitting: Cardiology

## 2021-04-28 ENCOUNTER — Telehealth: Payer: Self-pay | Admitting: Cardiology

## 2021-04-28 NOTE — Telephone Encounter (Signed)
Appointment made for medication adjustment.

## 2021-04-28 NOTE — Telephone Encounter (Signed)
° °  Pt's daughter calling, demanding an appt to see Dr. Bettina Gavia, she said, pt been in a hospital and changed medications and needs to be seen by Dr. Bettina Gavia

## 2021-04-29 ENCOUNTER — Other Ambulatory Visit: Payer: Self-pay

## 2021-04-29 DIAGNOSIS — R1310 Dysphagia, unspecified: Secondary | ICD-10-CM | POA: Insufficient documentation

## 2021-04-29 DIAGNOSIS — Z7409 Other reduced mobility: Secondary | ICD-10-CM | POA: Insufficient documentation

## 2021-04-29 DIAGNOSIS — Z789 Other specified health status: Secondary | ICD-10-CM | POA: Insufficient documentation

## 2021-04-29 HISTORY — DX: Other reduced mobility: Z74.09

## 2021-04-29 HISTORY — DX: Other specified health status: Z78.9

## 2021-04-30 ENCOUNTER — Encounter: Payer: Self-pay | Admitting: Cardiology

## 2021-04-30 ENCOUNTER — Ambulatory Visit: Payer: Medicare Other | Admitting: Cardiology

## 2021-04-30 ENCOUNTER — Other Ambulatory Visit: Payer: Self-pay

## 2021-04-30 VITALS — BP 100/44 | HR 83 | Ht 62.0 in | Wt 279.8 lb

## 2021-04-30 DIAGNOSIS — J9612 Chronic respiratory failure with hypercapnia: Secondary | ICD-10-CM

## 2021-04-30 DIAGNOSIS — J9611 Chronic respiratory failure with hypoxia: Secondary | ICD-10-CM

## 2021-04-30 DIAGNOSIS — G4733 Obstructive sleep apnea (adult) (pediatric): Secondary | ICD-10-CM

## 2021-04-30 DIAGNOSIS — I130 Hypertensive heart and chronic kidney disease with heart failure and stage 1 through stage 4 chronic kidney disease, or unspecified chronic kidney disease: Secondary | ICD-10-CM

## 2021-04-30 MED ORDER — HYDRALAZINE HCL 25 MG PO TABS: 25.0000 mg | ORAL_TABLET | Freq: Two times a day (BID) | ORAL | 3 refills | Status: DC

## 2021-04-30 NOTE — Patient Instructions (Signed)
Medication Instructions:  Your physician has recommended you make the following change in your medication:   STOP: Amlodipine  START: Hydralazine 25 mg twice daily - no more today (04/30/21)  *If you need a refill on your cardiac medications before your next appointment, please call your pharmacy*   Lab Work: None If you have labs (blood work) drawn today and your tests are completely normal, you will receive your results only by: [place] (if you have MyChart) OR A paper copy in the mail If you have any lab test that is abnormal or we need to change your treatment, we will call you to review the results.   Testing/Procedures: None   Follow-Up: At [facility], you and your health needs are our priority.  As part of our continuing mission to provide you with exceptional heart care, we have created designated Provider Care Teams.  These Care Teams include your primary Cardiologist (physician) and Advanced Practice Providers (APPs -  Physician Assistants and Nurse Practitioners) who all work together to provide you with the care you need, when you need it.  We recommend signing up for the patient portal called "MyChart".  Sign up information is provided on this After Visit Summary.  MyChart is used to connect with patients for Virtual Visits (Telemedicine).  Patients are able to view lab/test results, encounter notes, upcoming appointments, etc.  Non-urgent messages can be sent to your provider as well.   To learn more about what you can do with MyChart, go to [email or web address].    Your next appointment:   3 month(s)  The format for your next appointment:   In Person  Provider:   Shirlee More, MD    Other Instructions None

## 2021-04-30 NOTE — Addendum Note (Signed)
Addended by: Edwyna Shell I on: 04/30/2021 08:56 AM   Modules accepted: Orders

## 2021-04-30 NOTE — Progress Notes (Signed)
Cardiology Office Note:    Date:  04/30/2021   ID:  Mattingly, [place] October 01, 1945, MRN [id number]  PCP:  Myrlene Broker, MD  Cardiologist:  Shirlee More, MD    Referring MD: Myrlene Broker, MD    ASSESSMENT:    1. Hypertensive heart and chronic kidney disease with heart failure and stage 1 through stage 4 chronic kidney disease, or chronic kidney disease ([place])   2. Chronic respiratory failure with hypoxia and hypercapnia (HCC)   3. OSA (obstructive sleep apnea)    PLAN:    In order of problems listed above:  Today I think her blood pressure is overtreated being amlodipine is a poor choice with tendency to edema.  We reduced the dose of her hydralazine and I will give her no further antihypertensive agents today and follow blood pressure closely if remains less than 100 1007 I would stop her Cozaar.  I continue on her current loop diuretic she is not significantly fluid overloaded Her predominant problem is her chronic respiratory failure and severe sleep apnea and she has arrangements to follow-up with pulmonary at the end of the month   Next appointment: 3 months   Medication Adjustments/Labs and Tests Ordered: Current medicines are reviewed at length with the patient today.  Concerns regarding medicines are outlined above.  No orders of the defined types were placed in this encounter.  No orders of the defined types were placed in this encounter.  Complaint follow-up for hypertensive heart disease following hospitalization   History of Present Illness:    Donna Shaffer is a 76 y.o. female with a hx of hypertensive heart and chronic kidney disease with hyperkalemia and sleep apnea and chronic respiratory failure with hypoxia and hypercapnia last seen 03/10/2021 with respiratory distress marked bronchospasm and was referred to the emergency room for care.. Compliance with diet, lifestyle and medications: Yes  She is on BiPAP uses about 4 hours a day She  feels very weak today her blood pressure is 100/40 she is tremulous she is on a calcium channel blocker contributing to edema. We will stop amlodipine reduce her hydralazine and giving her a break today with no further antihypertensive agents. She is not fluid overloaded she will continue her current loop diuretic. She is not having chest pain palpitation or syncope  She was admitted to [facility] service 02/20/2019 to 03/15/2021 with acute exacerbation of her chronic lung disease was seen by cardiology during hospitalization medications were adjusted she received IV Lasix transition back to her usual torsemide and both Aldactone and Cozaar were added to her medical regimen.  She was admitted to [facility] 03/29/2021 discharge 12/09/2021 with acute decompensation and requiring BiPAP therapy.  She was encephalopathic ammonia was elevated and required lactulose and had transient deterioration in kidney function in hospital with diuresis and hyperkalemia..    Previous studies included a high-sensitivity troponin initial and repeat that were normal sodium 134 potassium 4.3 GFR 43 cc currently 9.3 Testing for COVID-19 was normal hemoglobin 12.5 CT of the chest was performed showed no acute cardiopulmonary abnormality.  Esophagram was performed showing transient laryngeal penetration with thin barium and dysmotility.  Echocardiogram 04/03/2021 showed mildly dilated left ventricle with mild concentric LVH normal ejection fraction 55 to 60% right ventricle normal size and function mild aortic regurgitation was seen.    She was seen in follow-up with her PCP 04/27/2021 BMP was normal except for GFR 40 cc creatinine 1.39 potassium 4.1 sodium 1.39  her magnesium was depleted 1.7. Past Medical History:  Diagnosis Date   Anxiety    Breast cancer ([place])    left   Breast cancer of upper-outer quadrant of left female breast ([place]) 03/11/2015   Cancer ([place])    Chronic diastolic heart  failure ([place]) 10/03/2018   Colon polyps    Community acquired pneumonia of both lower lobes 03/18/2018   Cough 08/08/2020   Decreased activities of daily living (ADL) 04/29/2021   Depression    Depression with anxiety    Dyspnea on exertion 12/14/2017   Elevated liver enzymes    Environmental and seasonal allergies 12/28/2017   GERD without esophagitis 11/24/2016   Last Assessment & Plan:  Concern over throat awareness and difficulty swallowing. Several year history.  Symptoms seem to be worsening over the last several months.  She gets a lump in her throat sensation.  Some difficulty swallowing.  Much throat clearing and sensation of drainage.  She is tried Zyrtec without improvement. EXAMINATION shows no intranasal polyps or purulence.  Oropharynx is unrem   Hypertension    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/14/2017   Impaired functional mobility, balance, gait, and endurance 04/29/2021   Malignant neoplasm of upper-outer quadrant of female breast ([place]) 03/11/2015   Medicare annual wellness visit, subsequent 12/28/2017   Mild persistent asthma without complication 09/15/9483   Morbid obesity ([place]) 08/26/2015   OSA (obstructive sleep apnea) 09/29/2018   Peripheral edema 09/15/2018   Personal history of radiation therapy    Psoriasis 03/02/2021   Psoriatic arthritis ([place]) 03/02/2021   Recurrent major depressive disorder, in full remission ([place]) 09/15/2018   Referred otalgia of both ears 11/22/2017   Last Assessment & Plan:  Formatting of this note might be different from the original. Concern over recurring ear pain. Chronic intermittent history of ear discomfort.  Symptoms resolved without treatment.  No history of ear surgery, trauma or infection. EXAMINATION shows normal external canal and tympanic membranes bilaterally.  TMJ and cervical musculature are okay.  Oropharynx and indirect lary   Type 2 diabetes mellitus with hyperglycemia, without long-term current use of insulin ([place]) 11/24/2016    Vitamin D deficiency 12/28/2017   Yeast dermatitis 11/03/2020    Past Surgical History:  Procedure Laterality Date   ABDOMINAL HYSTERECTOMY     APPENDECTOMY     BILATERAL OOPHORECTOMY     BREAST LUMPECTOMY Left 02/2015   CATARACT EXTRACTION Bilateral    CHOLECYSTECTOMY     RADIOACTIVE SEED GUIDED PARTIAL MASTECTOMY WITH AXILLARY SENTINEL LYMPH NODE BIOPSY Left 02/28/2015   Procedure: RADIOACTIVE SEED GUIDED PARTIAL MASTECTOMY WITH AXILLARY SENTINEL LYMPH NODE BIOPSY;  Surgeon: Autumn Messing III, MD;  Location: [facility];  Service: General;  Laterality: Left;    Current Medications: Current Meds  Medication Sig   albuterol (PROVENTIL) (2.5 MG/3ML) 0.083% nebulizer solution Take 3 mLs (2.5 mg total) by nebulization every 6 (six) hours as needed for wheezing or shortness of breath.   ALPRAZolam (XANAX) 0.5 MG tablet Take 0.5 mg by mouth at bedtime as needed for anxiety.   aspirin-acetaminophen-caffeine (EXCEDRIN MIGRAINE) 250-250-65 MG tablet Take 1 tablet by mouth every 8 (eight) hours as needed for migraine.   budesonide (PULMICORT) 0.5 MG/2ML nebulizer solution Inhale 2 mLs into the lungs every 12 (twelve) hours.   calcium carbonate (OS-CAL) 600 MG TABS tablet Take 1 tablet by mouth daily.   carvedilol (COREG) 6.25 MG tablet TAKE 1 TABLET(6.25 MG) BY MOUTH DAILY (Patient taking differently: Take 6.25 mg by  mouth 2 (two) times daily with a meal. TAKE 1 TABLET(6.25 MG) BY MOUTH DAILY)   cholecalciferol (VITAMIN D) 1000 units tablet Take 1 tablet (1,000 Units total) by mouth daily.   cholecalciferol (VITAMIN D3) 25 MCG (1000 UNIT) tablet Take 1,000 Units by mouth daily.   Cholecalciferol 25 MCG (1000 UT) capsule Take 1 capsule by mouth daily.   citalopram (CELEXA) 40 MG tablet Take 40 mg by mouth daily.   esomeprazole (NEXIUM) 40 MG capsule Take 40 mg by mouth daily.   hydrALAZINE (APRESOLINE) 50 MG tablet Take 1 tablet (50 mg total) by mouth 2 (two) times daily. (Patient  taking differently: Take 50 mg by mouth 3 (three) times daily.)   losartan (COZAAR) 25 MG tablet Take 3 tablets (75 mg total) by mouth daily.   montelukast (SINGULAIR) 10 MG tablet Take 10 mg by mouth at bedtime.   nitroGLYCERIN (NITROSTAT) 0.4 MG SL tablet Place 0.4 mg under the tongue every 5 (five) minutes as needed for chest pain.   olmesartan (BENICAR) 40 MG tablet Take 40 mg by mouth daily.   OXYGEN Inhale 3 L into the lungs continuous.   pantoprazole (PROTONIX) 40 MG tablet Take 40 mg by mouth daily. Reported on 07/03/2015   potassium chloride SA (KLOR-CON M20) 20 MEQ tablet Take 2 tablets (40 mEq total) by mouth daily. (please follow up with your PCP within a few days for repeat labs)   torsemide (DEMADEX) 20 MG tablet Take 1 tablet (20 mg total) by mouth 2 (two) times daily. Take 1 tablet daily & Take 1 tablet Daily PRN for Swelling (Patient taking differently: Take 20 mg by mouth daily.)   traMADol (ULTRAM) 50 MG tablet Take by mouth every 6 (six) hours as needed for moderate pain or severe pain.     Allergies:   Sulfa antibiotics, Ace inhibitors, Sulfasalazine, Iodinated contrast media, and Iohexol   Social History   Socioeconomic History   Marital status: Single    Spouse name: Not on file   Number of children: 2   Years of education: Not on file   Highest education level: Not on file  Occupational History   Occupation: Programme researcher, broadcasting/film/video, retired Therapist, sports  Tobacco Use   Smoking status: Never   Smokeless tobacco: Never  Vaping Use   Vaping Use: Never used  Substance and Sexual Activity   Alcohol use: No   Drug use: No   Sexual activity: Not Currently  Other Topics Concern   Not on file  Social History Narrative   Not on file   Social Determinants of Health   Financial Resource Strain: Not on file  Food Insecurity: Not on file  Transportation Needs: Not on file  Physical Activity: Not on file  Stress: Not on file  Social Connections: Not on file     Family  History: The patient's family history includes Melanoma in her mother; Skin cancer in her father. ROS:   Please see the history of present illness.    All other systems reviewed and are negative.  EKGs/Labs/Other Studies Reviewed:    The following studies were reviewed today:    Recent Labs: 03/10/2021: NT-Pro BNP 286 03/11/2021: ALT 49; B Natriuretic Peptide 145.0 03/13/2021: Hemoglobin 12.5; Platelets 249 03/15/2021: BUN 28; Creatinine, Ser 1.16; Potassium 3.9; Sodium 139  Recent Lipid Panel No results found for: CHOL, TRIG, HDL, CHOLHDL, VLDL, LDLCALC, LDLDIRECT  Physical Exam:    VS:  BP (!) 100/44 (BP Location: Right Arm, Patient Position: Sitting)  Pulse 83    Ht 5\' 2"  (1.575 m)    Wt 279 lb 12.8 oz (126.9 kg)    LMP  (LMP Unknown)    SpO2 94%    BMI 51.18 kg/m     Wt Readings from Last 3 Encounters:  04/30/21 279 lb 12.8 oz (126.9 kg)  03/15/21 289 lb 4.8 oz (131.2 kg)  03/10/21 295 lb 9.6 oz (134.1 kg)     GEN: She appears very tremulous and weak in my office today well nourished, well developed in no acute distress HEENT: Normal NECK: No JVD; No carotid bruits LYMPHATICS: No lymphadenopathy CARDIAC: Distant heart sounds no murmur gallop RRR, no murmurs, rubs, gallops RESPIRATORY:  Clear to auscultation without rales, wheezing or rhonchi  ABDOMEN: Soft, non-tender, non-distended MUSCULOSKELETAL: 1+ lower extremity dependent edema; No deformity  SKIN: Warm and dry NEUROLOGIC:  Alert and oriented x 3 PSYCHIATRIC:  Normal affect    Signed, Shirlee More, MD  04/30/2021 8:47 AM    [facility]

## 2021-08-07 ENCOUNTER — Ambulatory Visit: Payer: Medicare Other | Admitting: Cardiology

## 2021-10-23 ENCOUNTER — Other Ambulatory Visit: Payer: Self-pay | Admitting: Cardiology

## 2021-10-23 DIAGNOSIS — E876 Hypokalemia: Secondary | ICD-10-CM

## 2021-10-23 DIAGNOSIS — I130 Hypertensive heart and chronic kidney disease with heart failure and stage 1 through stage 4 chronic kidney disease, or unspecified chronic kidney disease: Secondary | ICD-10-CM

## 2021-10-23 DIAGNOSIS — I5032 Chronic diastolic (congestive) heart failure: Secondary | ICD-10-CM

## 2021-10-23 DIAGNOSIS — J9611 Chronic respiratory failure with hypoxia: Secondary | ICD-10-CM

## 2022-04-28 ENCOUNTER — Other Ambulatory Visit: Payer: Self-pay | Admitting: Cardiology

## 2022-04-28 DIAGNOSIS — E876 Hypokalemia: Secondary | ICD-10-CM

## 2022-04-28 DIAGNOSIS — I5032 Chronic diastolic (congestive) heart failure: Secondary | ICD-10-CM

## 2022-04-28 DIAGNOSIS — I130 Hypertensive heart and chronic kidney disease with heart failure and stage 1 through stage 4 chronic kidney disease, or unspecified chronic kidney disease: Secondary | ICD-10-CM

## 2022-04-28 DIAGNOSIS — J9611 Chronic respiratory failure with hypoxia: Secondary | ICD-10-CM

## 2022-06-07 ENCOUNTER — Other Ambulatory Visit: Payer: Self-pay | Admitting: Cardiology

## 2022-06-07 DIAGNOSIS — I130 Hypertensive heart and chronic kidney disease with heart failure and stage 1 through stage 4 chronic kidney disease, or unspecified chronic kidney disease: Secondary | ICD-10-CM

## 2022-06-07 DIAGNOSIS — E876 Hypokalemia: Secondary | ICD-10-CM

## 2022-06-07 DIAGNOSIS — J9611 Chronic respiratory failure with hypoxia: Secondary | ICD-10-CM

## 2022-06-07 DIAGNOSIS — I5032 Chronic diastolic (congestive) heart failure: Secondary | ICD-10-CM

## 2022-06-07 NOTE — Telephone Encounter (Signed)
Rx refill sent to pharmacy. 

## 2022-06-08 ENCOUNTER — Other Ambulatory Visit: Payer: Self-pay | Admitting: Cardiology

## 2022-06-08 DIAGNOSIS — E876 Hypokalemia: Secondary | ICD-10-CM

## 2022-06-08 DIAGNOSIS — I5032 Chronic diastolic (congestive) heart failure: Secondary | ICD-10-CM

## 2022-06-08 DIAGNOSIS — I130 Hypertensive heart and chronic kidney disease with heart failure and stage 1 through stage 4 chronic kidney disease, or unspecified chronic kidney disease: Secondary | ICD-10-CM

## 2022-06-08 DIAGNOSIS — J9611 Chronic respiratory failure with hypoxia: Secondary | ICD-10-CM

## 2022-06-30 NOTE — Progress Notes (Unsigned)
Cardiology Office Note:    Date:  07/01/2022   ID:  Donna Shaffer, Donna Shaffer 09-23-1945, MRN [id number]  PCP:  Hadley Pen, MD  Cardiologist:  Norman Herrlich, MD    Referring MD: Hadley Pen, MD    ASSESSMENT:    1. Hypertensive heart and chronic kidney disease with heart failure and stage 1 through stage 4 chronic kidney disease, or chronic kidney disease   2. Chronic respiratory failure with hypoxia and hypercapnia   3. OSA (obstructive sleep apnea)   4. Chronic diastolic heart failure   5. Hypokalemia   6. Obesity, morbid, BMI 40.0-49.9    PLAN:    In order of problems listed above:  She will continue her current antihypertensives carvedilol hydralazine and Benicar upcoming labs with her PCP should include BMP Stable CKD needs follow-up labs Very concerned she is not using CPAP at risk for CO2 retention. 6 months  Next appointment: 6 months   Medication Adjustments/Labs and Tests Ordered: Current medicines are reviewed at length with the patient today.  Concerns regarding medicines are outlined above.  Orders Placed This Encounter  Procedures   EKG 12-Lead   Meds ordered this encounter  Medications   carvedilol (COREG) 6.25 MG tablet    Sig: TAKE 1 TABLET(6.25 MG) BY MOUTH DAILY    Dispense:  90 tablet    Refill:  3    Final attempt. Must keep appt on 07/01/2022 for additional refills   Chief complaint follow-up for hypertensive heart disease   History of Present Illness:    Donna Shaffer is a 77 y.o. female with a hx of hypertensive heart and chronic kidney disease with hyperkalemia sleep apnea chronic hypoxic and hypercapnic respiratory failure and morbid obesity requiring BiPAP pulm therapy last seen 04/30/2021. She was seen [facility] pulmonary 01/11/2022 for obstructive sleep apnea treated with CPAP and asthma.  There have been no labs since her last visit. Compliance with diet, lifestyle and medications: Yes  She  no longer takes a diuretic on a regular basis instructed by nephrology to take as needed but has not required recently. Unfortunately her CPAP machine is malfunctioning and I made her promise that she have her grandson look-as previously she has had CO2 retention in the past.  She is complaining of difficulty with memory Does not have a edema no change in her chronic shortness of breath using ambulatory oxygen chest pain palpitation or syncope Past Medical History:  Diagnosis Date   Anxiety    Breast cancer    left   Breast cancer of upper-outer quadrant of left female breast 03/11/2015   Cancer    Chronic diastolic heart failure 10/03/2018   Colon polyps    Community acquired pneumonia of both lower lobes 03/18/2018   Cough 08/08/2020   Decreased activities of daily living (ADL) 04/29/2021   Depression    Depression with anxiety    Dyspnea on exertion 12/14/2017   Elevated liver enzymes    Environmental and seasonal allergies 12/28/2017   GERD without esophagitis 11/24/2016   Last Assessment & Plan:  Concern over throat awareness and difficulty swallowing. Several year history.  Symptoms seem to be worsening over the last several months.  She gets a lump in her throat sensation.  Some difficulty swallowing.  Much throat clearing and sensation of drainage.  She is tried Zyrtec without improvement. EXAMINATION shows no intranasal polyps or purulence.  Oropharynx is unrem   Hypertension    Hypertensive heart  and renal disease 12/14/2017   Impaired functional mobility, balance, gait, and endurance 04/29/2021   Malignant neoplasm of upper-outer quadrant of female breast 03/11/2015   Medicare annual wellness visit, subsequent 12/28/2017   Mild persistent asthma without complication 05/11/2018   Morbid obesity 08/26/2015   OSA (obstructive sleep apnea) 09/29/2018   Peripheral edema 09/15/2018   Personal history of radiation therapy    Psoriasis 03/02/2021   Psoriatic arthritis 03/02/2021    Recurrent major depressive disorder, in full remission 09/15/2018   Referred otalgia of both ears 11/22/2017   Last Assessment & Plan:  Formatting of this note might be different from the original. Concern over recurring ear pain. Chronic intermittent history of ear discomfort.  Symptoms resolved without treatment.  No history of ear surgery, trauma or infection. EXAMINATION shows normal external canal and tympanic membranes bilaterally.  TMJ and cervical musculature are okay.  Oropharynx and indirect lary   Type 2 diabetes mellitus with hyperglycemia, without long-term current use of insulin 11/24/2016   Vitamin D deficiency 12/28/2017   Yeast dermatitis 11/03/2020    Past Surgical History:  Procedure Laterality Date   ABDOMINAL HYSTERECTOMY     APPENDECTOMY     BILATERAL OOPHORECTOMY     BREAST LUMPECTOMY Left 02/2015   CATARACT EXTRACTION Bilateral    CHOLECYSTECTOMY     RADIOACTIVE SEED GUIDED PARTIAL MASTECTOMY WITH AXILLARY SENTINEL LYMPH NODE BIOPSY Left 02/28/2015   Procedure: RADIOACTIVE SEED GUIDED PARTIAL MASTECTOMY WITH AXILLARY SENTINEL LYMPH NODE BIOPSY;  Surgeon: Chevis Pretty III, MD;  Location: [facility];  Service: General;  Laterality: Left;    Current Medications: Current Meds  Medication Sig   albuterol (PROVENTIL) (2.5 MG/3ML) 0.083% nebulizer solution Take 3 mLs (2.5 mg total) by nebulization every 6 (six) hours as needed for wheezing or shortness of breath.   ALPRAZolam (XANAX) 0.5 MG tablet Take 0.5 mg by mouth at bedtime as needed for anxiety.   aspirin-acetaminophen-caffeine (EXCEDRIN MIGRAINE) 250-250-65 MG tablet Take 1 tablet by mouth every 8 (eight) hours as needed for migraine.   BREO ELLIPTA 200-25 MCG/ACT AEPB Inhale 1 puff into the lungs daily.   calcium carbonate (OS-CAL) 600 MG TABS tablet Take 1 tablet by mouth daily.   cholecalciferol (VITAMIN D3) 25 MCG (1000 UNIT) tablet Take 1,000 Units by mouth daily.   citalopram (CELEXA) 40 MG tablet  Take 40 mg by mouth daily.   esomeprazole (NEXIUM) 40 MG capsule Take 40 mg by mouth daily.   hydrALAZINE (APRESOLINE) 50 MG tablet Take 50 mg by mouth 2 (two) times daily.   Magnesium 400 MG TABS Take 1 tablet by mouth daily.   montelukast (SINGULAIR) 10 MG tablet Take 10 mg by mouth at bedtime.   nitroGLYCERIN (NITROSTAT) 0.4 MG SL tablet Place 0.4 mg under the tongue every 5 (five) minutes as needed for chest pain.   olmesartan (BENICAR) 40 MG tablet Take 40 mg by mouth daily.   OXYGEN Inhale 3 L into the lungs continuous.   potassium chloride SA (KLOR-CON M20) 20 MEQ tablet Take 2 tablets (40 mEq total) by mouth daily. (please follow up with your PCP within a few days for repeat labs)   Risankizumab-rzaa (SKYRIZI PEN) 150 MG/ML SOAJ Inject into the skin every 3 (three) months.   torsemide (DEMADEX) 20 MG tablet Take 1 tablet (20 mg total) by mouth 2 (two) times daily. Take 1 tablet daily & Take 1 tablet Daily PRN for Swelling (Patient taking differently: Take 20 mg by mouth daily.)   [  DISCONTINUED] carvedilol (COREG) 6.25 MG tablet TAKE 1 TABLET(6.25 MG) BY MOUTH DAILY     Allergies:   Iodinated contrast media, Sulfa antibiotics, Ace inhibitors, Sulfasalazine, and Iohexol   Social History   Socioeconomic History   Marital status: Single    Spouse name: Not on file   Number of children: 2   Years of education: Not on file   Highest education level: Not on file  Occupational History   Occupation: Physiological scientist, retired [employer]  Tobacco Use   Smoking status: Never   Smokeless tobacco: Never  Vaping Use   Vaping Use: Never used  Substance and Sexual Activity   Alcohol use: No   Drug use: No   Sexual activity: Not Currently  Other Topics Concern   Not on file  Social History Narrative   Not on file   Social Determinants of Health   Financial Resource Strain: Not on file  Food Insecurity: Not on file  Transportation Needs: Not on file  Physical Activity: Not on file   Stress: Not on file  Social Connections: Not on file     Family History: The patient's family history includes Melanoma in her mother; Skin cancer in her father. ROS:   Please see the history of present illness.    All other systems reviewed and are negative.  EKGs/Labs/Other Studies Reviewed:    The following studies were reviewed today:  Cardiac Studies & Procedures       ECHOCARDIOGRAM  ECHOCARDIOGRAM COMPLETE 03/12/2021  Narrative ECHOCARDIOGRAM REPORT    Patient Name:   [facility] MAE Budde Date of Exam: 03/12/2021 Medical Rec #:  [id number]             Height:       62.0 in Accession #:    [id number]            Weight:       280.0 lb Date of Birth:  1946-03-20              BSA:          2.206 m Patient Age:    75 years              BP:           152/75 mmHg Patient Gender: F                     HR:           74 bpm. Exam Location:  Inpatient  Procedure: 2D Echo, Color Doppler, Cardiac Doppler and Intracardiac Opacification Agent  Indications:    CHF  History:        Patient has prior history of Echocardiogram examinations, most recent 03/28/2017. Risk Factors:Morbid obesity. Asthma. Elevated troponins.  Sonographer:    Roosvelt Maser RDCS Referring Phys: [id number] JENNIFER YATES   Sonographer Comments: Patient is morbidly obese and no subcostal window. IMPRESSIONS   1. Left ventricular ejection fraction, by estimation, is 60 to 65%. The left ventricle has normal function. The left ventricle has no regional wall motion abnormalities. The left ventricular internal cavity size was mildly dilated. There is mild concentric left ventricular hypertrophy. Left ventricular diastolic parameters are consistent with Grade II diastolic dysfunction (pseudonormalization). Elevated left ventricular end-diastolic pressure. 2. Right ventricular systolic function is normal. The right ventricular size is normal. 3. Left atrial size was mild to moderately dilated. 4. The mitral valve  was not well visualized. Trivial mitral valve regurgitation. No evidence of mitral stenosis.  Moderate mitral annular calcification. 5. The aortic valve was not well visualized. Aortic valve regurgitation is mild.  Comparison(s): No significant change from prior study.  Conclusion(s)/Recommendation(s): Technically challenging images even with echo contrast. Normal LVEF, no severe valve disease by Doppler.  FINDINGS Left Ventricle: Left ventricular ejection fraction, by estimation, is 60 to 65%. The left ventricle has normal function. The left ventricle has no regional wall motion abnormalities. Definity contrast agent was given IV to delineate the left ventricular endocardial borders. The left ventricular internal cavity size was mildly dilated. There is mild concentric left ventricular hypertrophy. Left ventricular diastolic parameters are consistent with Grade II diastolic dysfunction (pseudonormalization). Elevated left ventricular end-diastolic pressure.  Right Ventricle: The right ventricular size is normal. Right vetricular wall thickness was not well visualized. Right ventricular systolic function is normal.  Left Atrium: Left atrial size was mild to moderately dilated.  Right Atrium: Right atrial size was not well visualized.  Pericardium: There is no evidence of pericardial effusion. Presence of epicardial fat layer.  Mitral Valve: The mitral valve was not well visualized. Moderate mitral annular calcification. Trivial mitral valve regurgitation. No evidence of mitral valve stenosis.  Tricuspid Valve: The tricuspid valve is not well visualized. Tricuspid valve regurgitation is trivial. No evidence of tricuspid stenosis.  Aortic Valve: The aortic valve was not well visualized. Aortic valve regurgitation is mild. Aortic valve mean gradient measures 11.0 mmHg. Aortic valve peak gradient measures 19.4 mmHg. Aortic valve area, by VTI measures 1.65 cm.  Pulmonic Valve: The pulmonic valve  was not well visualized. Pulmonic valve regurgitation is not visualized.  Aorta: The aortic root, ascending aorta and aortic arch are all structurally normal, with no evidence of dilitation or obstruction.  Venous: The inferior vena cava was not well visualized.  IAS/Shunts: The interatrial septum was not well visualized.   LEFT VENTRICLE PLAX 2D LVIDd:         5.60 cm   Diastology LVIDs:         3.40 cm   LV e' medial:    8.38 cm/s LV PW:         1.30 cm   LV E/e' medial:  16.3 LV IVS:        1.30 cm   LV e' lateral:   4.68 cm/s LVOT diam:     2.00 cm   LV E/e' lateral: 29.3 LV SV:         74 LV SV Index:   34 LVOT Area:     3.14 cm   RIGHT VENTRICLE RV Basal diam:  3.00 cm  LEFT ATRIUM         Index       RIGHT ATRIUM           Index LA diam:    4.50 cm 2.04 cm/m  RA Area:     15.20 cm RA Volume:   36.00 ml  16.32 ml/m AORTIC VALVE AV Area (Vmax):    1.71 cm AV Area (Vmean):   1.63 cm AV Area (VTI):     1.65 cm AV Vmax:           220.00 cm/s AV Vmean:          155.000 cm/s AV VTI:            0.450 m AV Peak Grad:      19.4 mmHg AV Mean Grad:      11.0 mmHg LVOT Vmax:         120.00 cm/s  LVOT Vmean:        80.400 cm/s LVOT VTI:          0.236 m LVOT/AV VTI ratio: 0.52  AORTA Ao Root diam: 3.20 cm Ao Asc diam:  2.80 cm  MITRAL VALVE MV Area (PHT): 3.65 cm     SHUNTS MV Decel Time: 208 msec     Systemic VTI:  0.24 m MV E velocity: 137.00 cm/s  Systemic Diam: 2.00 cm MV A velocity: 96.50 cm/s MV E/A ratio:  1.42  Jodelle Red MD Electronically signed by Jodelle Red MD Signature Date/Time: 03/12/2021/8:10:06 PM    Final             EKG:  EKG ordered today and personally reviewed.  The ekg ordered today demonstrates sinus rhythm normal EKG  Recent Labs: She tells me she has an upcoming visit with her PCP and labs labs performed  Physical Exam:    VS:  BP (!) 138/56 (BP Location: Right Arm, Patient Position: Sitting)   Pulse  63   Ht 5\' 2"  (1.575 m)   Wt 275 lb (124.7 kg)   LMP  (LMP Unknown)   SpO2 96%   BMI 50.30 kg/m     Wt Readings from Last 3 Encounters:  07/01/22 275 lb (124.7 kg)  04/30/21 279 lb 12.8 oz (126.9 kg)  03/15/21 289 lb 4.8 oz (131.2 kg)     GEN: Marked obesity well nourished, well developed in no acute distress HEENT: Normal NECK: No JVD; No carotid bruits LYMPHATICS: No lymphadenopathy CARDIAC: RRR, no murmurs, rubs, gallops RESPIRATORY:  Clear to auscultation without rales, wheezing or rhonchi  ABDOMEN: Soft, non-tender, non-distended MUSCULOSKELETAL:  No edema; No deformity  SKIN: Warm and dry NEUROLOGIC:  Alert and oriented x 3 PSYCHIATRIC:  Normal affect    Signed, Norman Herrlich, MD  07/01/2022 3:33 PM    [facility]

## 2022-07-01 ENCOUNTER — Ambulatory Visit: Payer: Medicare Other | Attending: Cardiology | Admitting: Cardiology

## 2022-07-01 ENCOUNTER — Encounter: Payer: Self-pay | Admitting: Cardiology

## 2022-07-01 VITALS — BP 138/56 | HR 63 | Ht 62.0 in | Wt 275.0 lb

## 2022-07-01 DIAGNOSIS — E876 Hypokalemia: Secondary | ICD-10-CM

## 2022-07-01 DIAGNOSIS — G4733 Obstructive sleep apnea (adult) (pediatric): Secondary | ICD-10-CM | POA: Diagnosis not present

## 2022-07-01 DIAGNOSIS — I5032 Chronic diastolic (congestive) heart failure: Secondary | ICD-10-CM | POA: Diagnosis not present

## 2022-07-01 DIAGNOSIS — I130 Hypertensive heart and chronic kidney disease with heart failure and stage 1 through stage 4 chronic kidney disease, or unspecified chronic kidney disease: Secondary | ICD-10-CM | POA: Diagnosis not present

## 2022-07-01 DIAGNOSIS — J9612 Chronic respiratory failure with hypercapnia: Secondary | ICD-10-CM

## 2022-07-01 DIAGNOSIS — J9611 Chronic respiratory failure with hypoxia: Secondary | ICD-10-CM | POA: Diagnosis not present

## 2022-07-01 MED ORDER — CARVEDILOL 6.25 MG PO TABS
ORAL_TABLET | ORAL | 3 refills | Status: DC
Start: 2022-07-01 — End: 2023-08-01

## 2022-07-01 NOTE — Patient Instructions (Signed)
Medication Instructions:  Your physician recommends that you continue on your current medications as directed. Please refer to the Current Medication list given to you today.  *If you need a refill on your cardiac medications before your next appointment, please call your pharmacy*   Lab Work: None If you have labs (blood work) drawn today and your tests are completely normal, you will receive your results only by: MyChart Message (if you have MyChart) OR A paper copy in the mail If you have any lab test that is abnormal or we need to change your treatment, we will call you to review the results.   Testing/Procedures: None   Follow-Up: At [facility], you and your health needs are our priority.  As part of our continuing mission to provide you with exceptional heart care, we have created designated Provider Care Teams.  These Care Teams include your primary Cardiologist (physician) and Advanced Practice Providers (APPs -  Physician Assistants and Nurse Practitioners) who all work together to provide you with the care you need, when you need it.  We recommend signing up for the patient portal called "MyChart".  Sign up information is provided on this After Visit Summary.  MyChart is used to connect with patients for Virtual Visits (Telemedicine).  Patients are able to view lab/test results, encounter notes, upcoming appointments, etc.  Non-urgent messages can be sent to your provider as well.   To learn more about what you can do with MyChart, go to [email or web address].    Your next appointment:   6 month(s)  Provider:   Brian Munley, MD    Other Instructions None  

## 2023-04-14 ENCOUNTER — Encounter: Payer: Self-pay | Admitting: *Deleted

## 2023-04-14 ENCOUNTER — Encounter: Payer: Self-pay | Admitting: Cardiology

## 2023-04-14 ENCOUNTER — Telehealth: Payer: Self-pay | Admitting: *Deleted

## 2023-04-14 NOTE — Telephone Encounter (Signed)
   Pre-operative Risk Assessment    Patient Name: Donna Shaffer  DOB: [birth year] MRN: [id number]      Request for Surgical Clearance    Procedure:   Radical Partial Vulvectomy, Dissection Lymph Node Inguinal, pt has Vulvar cancer  Date of Surgery:  Clearance 05/17/23                                 Surgeon:  Linzie Collin, MD Surgeon's Group or Practice Name:  [facility] Phone number:  [phone] Fax number:  [phone]   Type of Clearance Requested:  Medical    Type of Anesthesia:  General    Additional requests/questions:    SignedStacie Glaze   04/14/2023, 3:36 PM

## 2023-04-14 NOTE — Telephone Encounter (Signed)
   Name: Donna Shaffer  DOB: 1945/04/16  MRN: [id number]  Primary Cardiologist: Norman Herrlich, MD   Preoperative team, please contact this patient and set up a phone call appointment for further preoperative risk assessment. Please obtain consent and complete medication review. Thank you for your help.  I confirm that guidance regarding antiplatelet and oral anticoagulation therapy has been completed and, if necessary, noted below.  None  I also confirmed the patient resides in the state of [place]. As per [facility] [employer] telemedicine laws, the patient must reside in the state in which the provider is licensed.   Napoleon Form, Leodis Rains, NP 04/14/2023, 4:12 PM [facility]

## 2023-04-14 NOTE — Telephone Encounter (Signed)
Called patient to schedule telephone visit with grandson he stated he scheduled her in office visit earlier today for preop clearance

## 2023-04-14 NOTE — Telephone Encounter (Signed)
Will update all parties involved pt has in office appt 04/21/23 Wallis Bamberg, NP.

## 2023-04-19 NOTE — Progress Notes (Addendum)
Cardiology Office Note:  .   Date:  04/21/2023  ID:  Donna Shaffer, Donna Shaffer 1945-12-19, MRN [id number] PCP: Hadley Pen, MD  [facility] Providers Cardiologist:  Norman Herrlich, MD    History of Present Illness: .   Donna Shaffer is a 78 y.o. female with a past medical history of HFpEF, hypertension, OSA not compliant with CPAP, GERD, DM2, history of breast cancer, morbid obesity  03/21/2023 echo EF 65 to 70%, moderate concentric LVH, mild aortic regurgitation, mild to moderate MR, severe TR with severe pulmonary hypertension 03/12/2021 echo EF 60 to 65%, mild concentric LVH, grade 2 DD, LA mild to moderately dilated, trivial MR with moderate mitral annular calcification, mild aortic regurgitation 09/19/2018 MPI negative for inducible ischemia  Most recently evaluated by Dr. Dulce Sellar on 07/01/2022, she was stable from a cardiac perspective, she was not compliant with her CPAP--but this has been an ongoing concern.  She was advised to follow-up in 6 months.  Since she was last evaluated in our office she has been diagnosed with vulvar cancer.  She presents today for preoperative evaluation for upcoming surgery.  She apparently also had a hospitalization where she was told she has severe pulmonary hypertension-as [facility] and tested positive for influenza A--echo was obtained which revealed severe pulmonary hypertension.  She is on oxygen 24 hours a day-as she does not follow with a pulmonologist but states her PCP advised her to follow-up with Dr. Lucie Leather for to establish care however, she has not been able to do this yet.  She has issues maintaining compliance with her CPAP, typically sleeps without it.  In the office today she is in a wheelchair. She denies chest pain, palpitations, pnd, orthopnea, n, v, dizziness, syncope, edema, weight gain, or early satiety.   ROS: Review of Systems  Constitutional:  Positive for malaise/fatigue.   Respiratory:  Positive for shortness of breath.   All other systems reviewed and are negative.    Studies Reviewed: .        Cardiac Studies & Procedures      ECHOCARDIOGRAM  ECHOCARDIOGRAM COMPLETE 03/12/2021  Narrative ECHOCARDIOGRAM REPORT    Patient Name:   Donna Shaffer Date of Exam: 03/12/2021 Medical Rec #:  [id number]             Height:       62.0 in Accession #:    [id number]            Weight:       280.0 lb Date of Birth:  [birth year]              BSA:          2.206 m Patient Age:    75 years              BP:           152/75 mmHg Patient Gender: F                     HR:           74 bpm. Exam Location:  Inpatient  Procedure: 2D Echo, Color Doppler, Cardiac Doppler and Intracardiac Opacification Agent  Indications:    CHF  History:        Patient has prior history of Echocardiogram examinations, most recent 03/28/2017. Risk Factors:Morbid obesity. Asthma. Elevated troponins.  Sonographer:    Roosvelt Maser RDCS Referring Phys: [id number] Maebell Lyvers YATES  Sonographer Comments: Patient is morbidly obese and no subcostal window. IMPRESSIONS   1. Left ventricular ejection fraction, by estimation, is 60 to 65%. The left ventricle has normal function. The left ventricle has no regional wall motion abnormalities. The left ventricular internal cavity size was mildly dilated. There is mild concentric left ventricular hypertrophy. Left ventricular diastolic parameters are consistent with Grade II diastolic dysfunction (pseudonormalization). Elevated left ventricular end-diastolic pressure. 2. Right ventricular systolic function is normal. The right ventricular size is normal. 3. Left atrial size was mild to moderately dilated. 4. The mitral valve was not well visualized. Trivial mitral valve regurgitation. No evidence of mitral stenosis. Moderate mitral annular calcification. 5. The aortic valve was not well visualized. Aortic valve regurgitation is  mild.  Comparison(s): No significant change from prior study.  Conclusion(s)/Recommendation(s): Technically challenging images even with echo contrast. Normal LVEF, no severe valve disease by Doppler.  FINDINGS Left Ventricle: Left ventricular ejection fraction, by estimation, is 60 to 65%. The left ventricle has normal function. The left ventricle has no regional wall motion abnormalities. Definity contrast agent was given IV to delineate the left ventricular endocardial borders. The left ventricular internal cavity size was mildly dilated. There is mild concentric left ventricular hypertrophy. Left ventricular diastolic parameters are consistent with Grade II diastolic dysfunction (pseudonormalization). Elevated left ventricular end-diastolic pressure.  Right Ventricle: The right ventricular size is normal. Right vetricular wall thickness was not well visualized. Right ventricular systolic function is normal.  Left [facility]: Left atrial size was mild to moderately dilated.  Right [facility]: Right atrial size was not well visualized.  Pericardium: There is no evidence of pericardial effusion. Presence of epicardial fat layer.  Mitral Valve: The mitral valve was not well visualized. Moderate mitral annular calcification. Trivial mitral valve regurgitation. No evidence of mitral valve stenosis.  Tricuspid Valve: The tricuspid valve is not well visualized. Tricuspid valve regurgitation is trivial. No evidence of tricuspid stenosis.  Aortic Valve: The aortic valve was not well visualized. Aortic valve regurgitation is mild. Aortic valve mean gradient measures 11.0 mmHg. Aortic valve peak gradient measures 19.4 mmHg. Aortic valve area, by VTI measures 1.65 cm.  Pulmonic Valve: The pulmonic valve was not well visualized. Pulmonic valve regurgitation is not visualized.  Aorta: The aortic root, ascending aorta and aortic arch are all structurally normal, with no evidence of dilitation or  obstruction.  Venous: The inferior vena cava was not well visualized.  IAS/Shunts: The interatrial septum was not well visualized.   LEFT VENTRICLE PLAX 2D LVIDd:         5.60 cm   Diastology LVIDs:         3.40 cm   LV e' medial:    8.38 cm/s LV PW:         1.30 cm   LV E/e' medial:  16.3 LV IVS:        1.30 cm   LV e' lateral:   4.68 cm/s LVOT diam:     2.00 cm   LV E/e' lateral: 29.3 LV SV:         74 LV SV Index:   34 LVOT Area:     3.14 cm   RIGHT VENTRICLE RV Basal diam:  3.00 cm  LEFT ATRIUM         Index       RIGHT ATRIUM           Index LA diam:    4.50 cm 2.04 cm/m  RA Area:  15.20 cm RA Volume:   36.00 ml  16.32 ml/m AORTIC VALVE AV Area (Vmax):    1.71 cm AV Area (Vmean):   1.63 cm AV Area (VTI):     1.65 cm AV Vmax:           220.00 cm/s AV Vmean:          155.000 cm/s AV VTI:            0.450 m AV Peak Grad:      19.4 mmHg AV Mean Grad:      11.0 mmHg LVOT Vmax:         120.00 cm/s LVOT Vmean:        80.400 cm/s LVOT VTI:          0.236 m LVOT/AV VTI ratio: 0.52  AORTA Ao Root diam: 3.20 cm Ao Asc diam:  2.80 cm  MITRAL VALVE MV Area (PHT): 3.65 cm     SHUNTS MV Decel Time: 208 msec     Systemic VTI:  0.24 m MV E velocity: 137.00 cm/s  Systemic Diam: 2.00 cm MV A velocity: 96.50 cm/s MV E/A ratio:  1.42  Jodelle Red MD Electronically signed by Jodelle Red MD Signature Date/Time: 03/12/2021/8:10:06 PM    Final             Risk Assessment/Calculations:             Physical Exam:   VS:  BP 94/62   Pulse 64   Ht 5\' 2"  (1.575 m)   Wt 271 lb 12.8 oz (123.3 kg)   LMP  (LMP Unknown)   SpO2 96%   BMI 49.71 kg/m    Wt Readings from Last 3 Encounters:  04/21/23 271 lb 12.8 oz (123.3 kg)  07/01/22 275 lb (124.7 kg)  04/30/21 279 lb 12.8 oz (126.9 kg)    GEN: Chronically ill-appearing but no acute distress NECK: No JVD; No carotid bruits CARDIAC: RRR, no murmurs, rubs, gallops RESPIRATORY:  Clear to  auscultation without rales, wheezing or rhonchi  ABDOMEN: Soft, non-tender, non-distended EXTREMITIES:  No edema; No deformity   ASSESSMENT AND PLAN: .   HFpEF-NYHA class III, euvolemic.  Echocardiogram when she was hospitalized in December revealed severe pulmonary hypertension.  Will repeat echocardiogram.  She has not formally followed with a pulmonologist, has plans to establish with Dr. Lucie Leather, encouraged her to reach out as soon as possible.  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  She is not following with a pulmonologist, her PCP advised her to follow-up with Dr. Lucie Leather.  OSA on CPAP-she is not compliant, she states she tries to wear it when possible however her grandson is in the background saying she never wears it.  Pulmonary hypertension-noted on most recent echo when she was hospitalized, repeat echo.  Vulvar cancer-this is the purpose of her appointment today for preoperative evaluation  Preoperative evaluation-unfortunately she is wheelchair-bound on oxygen, will need to proceed with an ischemic evaluation  as well as a repeat echocardiogram before we able to clear her from a cardiac perspective.  We should be able to complete this prior to her surgery which is scheduled for 05/17/2023.   Addendum-stress evaluation revealed mild ischemia but was a low risk study, normal EF.  Echo revealed normal EF with mild left wall thickening and mild aortic regurgitation.  She has been optimized from a cardiac perspective and can proceed with surgery at an acceptable risk.    Informed Consent   Shared Decision Making/Informed Consent  The risks [chest pain, shortness of breath, cardiac arrhythmias, dizziness, blood pressure fluctuations, myocardial infarction, stroke/transient ischemic attack, nausea, vomiting, allergic reaction, radiation exposure, metallic taste sensation and life-threatening complications (estimated to be 1 in 10,000)], benefits (risk stratification, diagnosing coronary  artery disease, treatment guidance) and alternatives of a nuclear stress test were discussed in detail with Ms. Futrell and she agrees to proceed.     Dispo: Echocardiogram, stress evaluation, follow-up with general cardiology in 6 months.  Signed, Flossie Dibble, NP

## 2023-04-21 ENCOUNTER — Ambulatory Visit: Payer: Medicare Other | Attending: Cardiology | Admitting: Cardiology

## 2023-04-21 ENCOUNTER — Encounter: Payer: Self-pay | Admitting: Cardiology

## 2023-04-21 VITALS — BP 94/62 | HR 64 | Ht 62.0 in | Wt 271.8 lb

## 2023-04-21 DIAGNOSIS — Z01818 Encounter for other preprocedural examination: Secondary | ICD-10-CM

## 2023-04-21 DIAGNOSIS — Z01810 Encounter for preprocedural cardiovascular examination: Secondary | ICD-10-CM

## 2023-04-21 DIAGNOSIS — I10 Essential (primary) hypertension: Secondary | ICD-10-CM

## 2023-04-21 DIAGNOSIS — I5032 Chronic diastolic (congestive) heart failure: Secondary | ICD-10-CM

## 2023-04-21 DIAGNOSIS — E785 Hyperlipidemia, unspecified: Secondary | ICD-10-CM | POA: Diagnosis not present

## 2023-04-21 DIAGNOSIS — I272 Pulmonary hypertension, unspecified: Secondary | ICD-10-CM

## 2023-04-21 NOTE — Patient Instructions (Signed)
Medication Instructions:  None  *If you need a refill on your cardiac medications before your next appointment, please call your pharmacy*   Lab Work: None  If you have labs (blood work) drawn today and your tests are completely normal, you will receive your results only by: MyChart Message (if you have MyChart) OR A paper copy in the mail If you have any lab test that is abnormal or we need to change your treatment, we will call you to review the results.   Testing/Procedures:   [facility] Cardiovascular Imaging at [facility] [address] [place], [place] [address] Phone: [phone]   Please arrive 15 minutes prior to your appointment time for registration and insurance purposes.  The test will take approximately 3 to 4 hours to complete; you may bring reading material.  If someone comes with you to your appointment, they will need to remain in the main lobby due to limited space in the testing area. **If you are pregnant or breastfeeding, please notify the nuclear lab prior to your appointment**  How to prepare for your Myocardial Perfusion Test: Do not eat or drink 3 hours prior to your test, except you may have water. Do not consume products containing caffeine (regular or decaffeinated) 12 hours prior to your test. (ex: coffee, chocolate, sodas, tea). Do bring a list of your current medications with you.  If not listed below, you may take your medications as normal. Do not take carvedilol (Coreg) for 24 hours prior to the test.  Bring the medication to your appointment as you may be required to take it once the test is complete. Do wear comfortable clothes (no dresses or overalls) and walking shoes, tennis shoes preferred (No heels or open toe shoes are allowed). Do NOT wear cologne, perfume, aftershave, or lotions (deodorant is allowed). If these instructions are not followed, your test will have to be rescheduled.  If you cannot keep your appointment, please provide 24  hours notification to the Nuclear Lab, to avoid a possible $50 charge to your account.     Your physician has requested that you have an echocardiogram. Echocardiography is a painless test that uses sound waves to create images of your heart. It provides your doctor with information about the size and shape of your heart and how well your heart's chambers and valves are working. This procedure takes approximately one hour. There are no restrictions for this procedure. Please do NOT wear cologne, perfume, aftershave, or lotions (deodorant is allowed). Please arrive 15 minutes prior to your appointment time.  Please note: We ask at that you not bring children with you during ultrasound (echo/ vascular) testing. Due to room size and safety concerns, children are not allowed in the ultrasound rooms during exams. Our front office staff cannot provide observation of children in our lobby area while testing is being conducted. An adult accompanying a patient to their appointment will only be allowed in the ultrasound room at the discretion of the ultrasound technician under special circumstances. We apologize for any inconvenience.    Follow-Up: At [facility], you and your health needs are our priority.  As part of our continuing mission to provide you with exceptional heart care, we have created designated Provider Care Teams.  These Care Teams include your primary Cardiologist (physician) and Advanced Practice Providers (APPs -  Physician Assistants and Nurse Practitioners) who all work together to provide you with the care you need, when you need it.  We recommend signing up  for the patient portal called "MyChart".  Sign up information is provided on this After Visit Summary.  MyChart is used to connect with patients for Virtual Visits (Telemedicine).  Patients are able to view lab/test results, encounter notes, upcoming appointments, etc.  Non-urgent messages can be sent to your provider as  well.   To learn more about what you can do with MyChart, go to [email or web address].    Your next appointment:   6 month  Other Instructions  Cardiac Nuclear Scan A cardiac nuclear scan is a test that is done to check the flow of blood to your heart. It is done when you are resting and when you are exercising. The test looks for problems such as: Not enough blood reaching a portion of the heart. The heart muscle not working as it should. You may need this test if you have: Heart disease. Lab results that are not normal. Had heart surgery or a balloon procedure to open up blocked arteries (angioplasty) or a small mesh tube (stent). Chest pain. Shortness of breath. Had a heart attack. In this test, a special dye (tracer) is put into your bloodstream. The tracer will travel to your heart. A camera will then take pictures of your heart to see how the tracer moves through your heart. This test is usually done at a hospital and takes 2-4 hours. Tell a doctor about: Any allergies you have. All medicines you are taking, including vitamins, herbs, eye drops, creams, and over-the-counter medicines. Any bleeding problems you have. Any surgeries you have had. Any medical conditions you have. Whether you are pregnant or may be pregnant. Any history of asthma or long-term (chronic) lung disease. Any history of heart rhythm disorders or heart valve conditions. What are the risks? Your doctor will talk with you about risks. These may include: Serious chest pain and heart attack. This is only a risk if the stress portion of the test is done. Fast or uneven heartbeats (palpitations). A feeling of warmth in your chest. This feeling usually does not last long. Allergic reaction to the tracer. Shortness of breath or trouble breathing. What happens before the test? Ask your doctor about changing or stopping your normal medicines. Follow instructions from your doctor about what you cannot eat or  drink. Remove your jewelry on the day of the test. Ask your doctor if you need to avoid nicotine or caffeine. What happens during the test? An IV tube will be inserted into one of your veins. Your doctor will give you a small amount of tracer through the IV tube. You will wait for 20-40 minutes while the tracer moves through your bloodstream. Your heart will be monitored with an electrocardiogram (ECG). You will lie down on an exam table. Pictures of your heart will be taken for about 15-20 minutes. You may also have a stress test. For this test, one of these things may be done: You will be asked to exercise on a treadmill or a stationary bike. You will be given medicines that will make your heart work harder. This is done if you are unable to exercise. When blood flow to your heart has peaked, a tracer will again be given through the IV tube. After 20-40 minutes, you will get back on the exam table. More pictures will be taken of your heart. Depending on the tracer that is used, more pictures may need to be taken 3-4 hours later. Your IV tube will be removed when the test is over. The test  may vary among doctors and hospitals. What happens after the test? Ask your doctor: Whether you can return to your normal schedule, including diet, activities, travel, and medicines. Whether you should drink more fluids. This will help to remove the tracer from your body. Ask your doctor, or the department that is doing the test: When will my results be ready? How will I get my results? What are my treatment options? What other tests do I need? What are my next steps? This information is not intended to replace advice given to you by your health care provider. Make sure you discuss any questions you have with your health care provider. Document Revised: 08/04/2021 Document Reviewed: 08/04/2021 Elsevier Patient Education  2023 Elsevier Inc.  Echocardiogram An echocardiogram is a test that uses  sound waves (ultrasound) to produce images of the heart. Images from an echocardiogram can provide important information about: Heart size and shape. The size and thickness and movement of your heart's walls. Heart muscle function and strength. Heart valve function or if you have stenosis. Stenosis is when the heart valves are too narrow. If blood is flowing backward through the heart valves (regurgitation). A tumor or infectious growth around the heart valves. Areas of heart muscle that are not working well because of poor blood flow or injury from a heart attack. Aneurysm detection. An aneurysm is a weak or damaged part of an artery wall. The wall bulges out from the normal force of blood pumping through the body. Tell a health care provider about: Any allergies you have. All medicines you are taking, including vitamins, herbs, eye drops, creams, and over-the-counter medicines. Any blood disorders you have. Any surgeries you have had. Any medical conditions you have. Whether you are pregnant or may be pregnant. What are the risks? Generally, this is a safe test. However, problems may occur, including an allergic reaction to dye (contrast) that may be used during the test. What happens before the test? No specific preparation is needed. You may eat and drink normally. What happens during the test?  You will take off your clothes from the waist up and put on a hospital gown. Electrodes or electrocardiogram (ECG)patches may be placed on your chest. The electrodes or patches are then connected to a device that monitors your heart rate and rhythm. You will lie down on a table for an ultrasound exam. A gel will be applied to your chest to help sound waves pass through your skin. A handheld device, called a transducer, will be pressed against your chest and moved over your heart. The transducer produces sound waves that travel to your heart and bounce back (or "echo" back) to the transducer.  These sound waves will be captured in real-time and changed into images of your heart that can be viewed on a video monitor. The images will be recorded on a computer and reviewed by your health care provider. You may be asked to change positions or hold your breath for a short time. This makes it easier to get different views or better views of your heart. In some cases, you may receive contrast through an IV in one of your veins. This can improve the quality of the pictures from your heart. The procedure may vary among health care providers and hospitals. What can I expect after the test? You may return to your normal, everyday life, including diet, activities, and medicines, unless your health care provider tells you not to do that. Follow these instructions at home: It  is up to you to get the results of your test. Ask your health care provider, or the department that is doing the test, when your results will be ready. Keep all follow-up visits. This is important. Summary An echocardiogram is a test that uses sound waves (ultrasound) to produce images of the heart. Images from an echocardiogram can provide important information about the size and shape of your heart, heart muscle function, heart valve function, and other possible heart problems. You do not need to do anything to prepare before this test. You may eat and drink normally. After the echocardiogram is completed, you may return to your normal, everyday life, unless your health care provider tells you not to do that. This information is not intended to replace advice given to you by your health care provider. Make sure you discuss any questions you have with your health care provider. Document Revised: 11/19/2020 Document Reviewed: 10/30/2019 Elsevier Patient Education  2023 Elsevier Inc.    Important Information About Sugar

## 2023-04-26 ENCOUNTER — Telehealth (HOSPITAL_COMMUNITY): Payer: Self-pay | Admitting: *Deleted

## 2023-04-26 NOTE — Telephone Encounter (Signed)
 Left message on voicemail per DPR in reference to upcoming appointment scheduled on 05/03/2023 at 8:15 with detailed instructions given per Myocardial Perfusion Study Information Sheet for the test. LM to arrive 15 minutes early, and that it is imperative to arrive on time for appointment to keep from having the test rescheduled. If you need to cancel or reschedule your appointment, please call the office within 24 hours of your appointment. Failure to do so may result in a cancellation of your appointment, and a $50 no show fee. Phone number given for call back for any questions.

## 2023-05-03 ENCOUNTER — Ambulatory Visit (INDEPENDENT_AMBULATORY_CARE_PROVIDER_SITE_OTHER): Payer: Medicare Other

## 2023-05-03 ENCOUNTER — Ambulatory Visit: Payer: Medicare Other | Attending: Cardiology

## 2023-05-03 DIAGNOSIS — Z01818 Encounter for other preprocedural examination: Secondary | ICD-10-CM | POA: Diagnosis not present

## 2023-05-03 DIAGNOSIS — Z01810 Encounter for preprocedural cardiovascular examination: Secondary | ICD-10-CM

## 2023-05-03 DIAGNOSIS — I272 Pulmonary hypertension, unspecified: Secondary | ICD-10-CM | POA: Diagnosis not present

## 2023-05-03 LAB — ECHOCARDIOGRAM COMPLETE
Area-P 1/2: 3.17 cm2
Height: 62 in
MV M vel: 5.42 m/s
MV Peak grad: 117.5 mmHg
P 1/2 time: 456 ms
S' Lateral: 3.3 cm
Weight: 4336 [oz_av]

## 2023-05-03 MED ORDER — TECHNETIUM TC 99M TETROFOSMIN IV KIT
32.7000 | PACK | Freq: Once | INTRAVENOUS | Status: AC | PRN
  Administered 2023-05-03: 32.7 via INTRAVENOUS

## 2023-05-03 MED ORDER — REGADENOSON 0.4 MG/5ML IV SOLN
0.4000 mg | Freq: Once | INTRAVENOUS | Status: AC
  Administered 2023-05-03: 0.4 mg via INTRAVENOUS

## 2023-05-03 MED ORDER — PERFLUTREN LIPID MICROSPHERE
1.0000 mL | INTRAVENOUS | Status: AC | PRN
  Administered 2023-05-03: 10 mL via INTRAVENOUS

## 2023-05-04 ENCOUNTER — Ambulatory Visit: Payer: Medicare Other | Attending: Cardiology

## 2023-05-05 ENCOUNTER — Telehealth: Payer: Self-pay | Admitting: Emergency Medicine

## 2023-05-05 LAB — MYOCARDIAL PERFUSION IMAGING
LV dias vol: 105 mL (ref 46–106)
LV sys vol: 40 mL
Nuc Stress EF: 62 %
Peak HR: 89 {beats}/min
Rest HR: 71 {beats}/min
SDS: 4
SRS: 11
SSS: 15
ST Depression (mm): 0 mm
Stress Nuclear Isotope Dose: 32.7 mCi
TID: 1

## 2023-05-05 NOTE — Telephone Encounter (Signed)
-----   Message from Flossie Dibble sent at 05/05/2023 12:57 PM EST ----- Echo shows that your heart is squeezing well, mild thickening around the left wall which is typically associated with high blood pressure.  Valves are opening and closing well, there is slight thickening of your aortic valve however this would likely not cause any symptoms.  Overall, largely no difference from the last echo she had 2 years ago.

## 2023-05-05 NOTE — Telephone Encounter (Signed)
Called patient and left a voicemail

## 2023-05-06 ENCOUNTER — Telehealth: Payer: Self-pay | Admitting: Cardiology

## 2023-05-06 ENCOUNTER — Telehealth: Payer: Self-pay | Admitting: Emergency Medicine

## 2023-05-06 NOTE — Telephone Encounter (Signed)
Spoke to grandson, Niotaze per [employer]. Reviewed results as per Jimmy Footman note. Reviewed that Wallis Bamberg, NP  reports that patient is cleared for surgery. Leighton Parody verbalized understanding and had no further questions.

## 2023-05-06 NOTE — Telephone Encounter (Signed)
Donna Shaffer would like a call back to discuss results

## 2023-05-06 NOTE — Telephone Encounter (Signed)
Results reviewed with pt as per Wallis Bamberg NP's note.  Pt verbalized understanding and had no additional questions. Routed to PCP.

## 2023-05-06 NOTE — Telephone Encounter (Signed)
-----   Message from Flossie Dibble sent at 05/05/2023 12:57 PM EST ----- Echo shows that your heart is squeezing well, mild thickening around the left wall which is typically associated with high blood pressure.  Valves are opening and closing well, there is slight thickening of your aortic valve however this would likely not cause any symptoms.  Overall, largely no difference from the last echo she had 2 years ago.

## 2023-07-31 ENCOUNTER — Other Ambulatory Visit: Payer: Self-pay | Admitting: Cardiology

## 2023-07-31 DIAGNOSIS — E876 Hypokalemia: Secondary | ICD-10-CM

## 2023-07-31 DIAGNOSIS — J9611 Chronic respiratory failure with hypoxia: Secondary | ICD-10-CM

## 2023-07-31 DIAGNOSIS — I5032 Chronic diastolic (congestive) heart failure: Secondary | ICD-10-CM

## 2023-07-31 DIAGNOSIS — I130 Hypertensive heart and chronic kidney disease with heart failure and stage 1 through stage 4 chronic kidney disease, or unspecified chronic kidney disease: Secondary | ICD-10-CM

## 2023-11-21 DEATH — deceased
